# Patient Record
Sex: Male | Born: 1965 | Race: Black or African American | Hispanic: No | Marital: Single | Smoking: Current every day smoker
Health system: Southern US, Community
[De-identification: ages and names within clinical notes are randomized; demographics above are authoritative.]

## PROBLEM LIST (undated history)

## (undated) DIAGNOSIS — J45909 Unspecified asthma, uncomplicated: Secondary | ICD-10-CM

## (undated) DIAGNOSIS — J302 Other seasonal allergic rhinitis: Secondary | ICD-10-CM

## (undated) DIAGNOSIS — I10 Essential (primary) hypertension: Secondary | ICD-10-CM

## (undated) DIAGNOSIS — I509 Heart failure, unspecified: Secondary | ICD-10-CM

## (undated) HISTORY — DX: Heart failure, unspecified: I50.9

## (undated) HISTORY — DX: Essential (primary) hypertension: I10

## (undated) HISTORY — DX: Other seasonal allergic rhinitis: J30.2

## (undated) HISTORY — DX: Unspecified asthma, uncomplicated: J45.909

---

## 2001-11-16 ENCOUNTER — Emergency Department (HOSPITAL_COMMUNITY): Admission: EM | Admit: 2001-11-16 | Discharge: 2001-11-16 | Payer: Self-pay | Admitting: Emergency Medicine

## 2001-11-27 ENCOUNTER — Emergency Department (HOSPITAL_COMMUNITY): Admission: EM | Admit: 2001-11-27 | Discharge: 2001-11-27 | Payer: Self-pay | Admitting: Emergency Medicine

## 2006-12-27 ENCOUNTER — Emergency Department (HOSPITAL_COMMUNITY): Admission: EM | Admit: 2006-12-27 | Discharge: 2006-12-27 | Payer: Self-pay | Admitting: Emergency Medicine

## 2007-01-07 ENCOUNTER — Emergency Department (HOSPITAL_COMMUNITY): Admission: EM | Admit: 2007-01-07 | Discharge: 2007-01-07 | Payer: Self-pay | Admitting: Emergency Medicine

## 2012-05-29 ENCOUNTER — Emergency Department (HOSPITAL_COMMUNITY)
Admission: EM | Admit: 2012-05-29 | Discharge: 2012-05-30 | Disposition: A | Discharge status: Home / Self Care | Payer: BC Managed Care – PPO | Attending: Emergency Medicine | Admitting: Emergency Medicine

## 2012-05-29 ENCOUNTER — Ambulatory Visit: Payer: BC Managed Care – PPO

## 2012-05-29 ENCOUNTER — Ambulatory Visit (INDEPENDENT_AMBULATORY_CARE_PROVIDER_SITE_OTHER): Payer: BC Managed Care – PPO | Admitting: Family Medicine

## 2012-05-29 ENCOUNTER — Encounter (HOSPITAL_COMMUNITY): Payer: Self-pay | Admitting: Emergency Medicine

## 2012-05-29 VITALS — BP 196/140 | HR 125 | Temp 98.4°F | Resp 18 | Ht 68.5 in | Wt 190.0 lb

## 2012-05-29 DIAGNOSIS — I10 Essential (primary) hypertension: Secondary | ICD-10-CM

## 2012-05-29 DIAGNOSIS — F17200 Nicotine dependence, unspecified, uncomplicated: Secondary | ICD-10-CM | POA: Insufficient documentation

## 2012-05-29 DIAGNOSIS — R0601 Orthopnea: Secondary | ICD-10-CM

## 2012-05-29 DIAGNOSIS — R0609 Other forms of dyspnea: Secondary | ICD-10-CM | POA: Insufficient documentation

## 2012-05-29 DIAGNOSIS — I160 Hypertensive urgency: Secondary | ICD-10-CM

## 2012-05-29 DIAGNOSIS — R0989 Other specified symptoms and signs involving the circulatory and respiratory systems: Secondary | ICD-10-CM | POA: Insufficient documentation

## 2012-05-29 DIAGNOSIS — Z8709 Personal history of other diseases of the respiratory system: Secondary | ICD-10-CM | POA: Insufficient documentation

## 2012-05-29 DIAGNOSIS — R0602 Shortness of breath: Secondary | ICD-10-CM

## 2012-05-29 DIAGNOSIS — J45909 Unspecified asthma, uncomplicated: Secondary | ICD-10-CM | POA: Insufficient documentation

## 2012-05-29 LAB — POCT UA - MICROSCOPIC ONLY
Casts, Ur, LPF, POC: NEGATIVE
Yeast, UA: NEGATIVE

## 2012-05-29 LAB — POCT URINALYSIS DIPSTICK
Glucose, UA: NEGATIVE
Leukocytes, UA: NEGATIVE
Nitrite, UA: NEGATIVE
Spec Grav, UA: >=1.03
pH, UA: 5.5

## 2012-05-29 LAB — POCT CBC
HCT, POC: 44.9 % (ref 43.5–53.7)
MCH, POC: 24.2 pg — AB (ref 27–31.2)
MCV: 79.8 fL — AB (ref 80–97)
MPV: 10.2 fL (ref 0–99.8)
Platelet Count, POC: 485 10*3/uL — AB (ref 142–424)
RBC: 5.63 M/uL (ref 4.69–6.13)
WBC: 10.2 10*3/uL (ref 4.6–10.2)

## 2012-05-29 LAB — POCT I-STAT, CHEM 8
BUN: 19 mg/dL (ref 6–23)
HCT: 49 % (ref 39.0–52.0)
Hemoglobin: 16.7 g/dL (ref 13.0–17.0)
TCO2: 24 mmol/L (ref 0–100)

## 2012-05-29 LAB — GLUCOSE, POCT (MANUAL RESULT ENTRY): POC Glucose: 125 mg/dl — AB (ref 70–99)

## 2012-05-29 MED ORDER — CLONIDINE HCL 0.1 MG PO TABS
0.1000 mg | ORAL_TABLET | Freq: Once | ORAL | Status: AC
  Administered 2012-05-29: 0.1 mg via ORAL

## 2012-05-29 NOTE — ED Provider Notes (Signed)
History     CSN: [id number]  Arrival date & time 05/29/12  2036   First MD Initiated Contact with Patient 05/29/12 2329      Chief Complaint  Patient presents with  . Hypertension    (Consider location/radiation/quality/duration/timing/severity/associated sxs/prior treatment) HPI This is a 47 year old male with a history of untreated hypertension. He has been having nocturnal dyspnea for about the past 2 weeks. Specifically he states he is awakened in the middle of the night feeling short of breath. These episodes resolve after several minutes. They usually only occur once a night. There is no associated chest pain, headache, swelling, numbness or weakness. He was seen at [facility] urgent care this evening about 6:30. He was diagnosed with malignant hypertension with blood pressure noted to be as high as 196/151. It was also noted to be hypertensive. He was given clonidine 0.1 mg and sent to the ED for further evaluation; his latest blood pressure is 169/131 following clonidine administration. He denies shortness of breath or chest pain at the present time.  Past Medical History  Diagnosis Date  . Seasonal allergies   . Asthma   . Hypertension     History reviewed. No pertinent past surgical history.  Family History  Problem Relation Age of Onset  . Cancer Mother     Breast cancer    History  Substance Use Topics  . Smoking status: Current Every Day Smoker    Types: Cigarettes  . Smokeless tobacco: Not on file  . Alcohol Use: No      Review of Systems  All other systems reviewed and are negative.    Allergies  Penicillins  Home Medications   Current Outpatient Rx  Name  Route  Sig  Dispense  Refill  . cetirizine (ZYRTEC) 10 MG tablet   Oral   Take 10 mg by mouth daily as needed for allergies.         Marland Kitchen loratadine-pseudoephedrine (CLARITIN-D 12-HOUR) 5-120 MG per tablet   Oral   Take 1 tablet by mouth daily as needed for allergies.         .  Phenyleph-Doxylamine-DM-APAP (ALKA-SELTZER PLS ALLERGY & [facility] PO)   Oral   Take 1 tablet by mouth every 4 (four) hours as needed (for congestion).           BP 177/125  Pulse 117  Temp(Src) 97.6 F (36.4 C) (Oral)  Resp 16  SpO2 96%  Physical Exam General: Well-developed, well-nourished male in no acute distress; appearance consistent with age of record HENT: normocephalic, atraumatic Eyes: pupils equal round and reactive to light; extraocular muscles intact Neck: supple Heart: regular rate and rhythm; no murmurs, rubs or gallops Lungs: clear to auscultation bilaterally Abdomen: soft; nondistended; nontender; bowel sounds present Extremities: No deformity; full range of motion; pulses normal; trace edema of ankles Neurologic: Awake, alert and oriented; motor function intact in all extremities and symmetric; no facial droop; normal coordination and speech Skin: Warm and dry Psychiatric: Normal mood and affect    ED Course  Procedures (including critical care time)     MDM   Nursing notes and vitals signs, including pulse oximetry, reviewed.  Summary of this visit's results, reviewed by myself:  Labs:  Results for orders placed during the hospital encounter of 05/29/12 (from the past 24 hour(s))  PRO B NATRIURETIC PEPTIDE     Status: Abnormal   Collection Time    05/29/12 11:36 PM      Result Value Range   Pro  B Natriuretic peptide (BNP) 5344.0 (*) 0 - 125 pg/mL  POCT I-STAT, CHEM 8     Status: Abnormal   Collection Time    05/29/12 11:55 PM      Result Value Range   Sodium 142  135 - 145 mEq/L   Potassium 4.5  3.5 - 5.1 mEq/L   Chloride 106  96 - 112 mEq/L   BUN 19  6 - 23 mg/dL   Creatinine, Ser 5.78 (*) 0.50 - 1.35 mg/dL   Glucose, Bld 95  70 - 99 mg/dL   Calcium, Ion 4.69  1.12 - 1.23 mmol/L   TCO2 24  0 - 100 mmol/L   Hemoglobin 16.7  13.0 - 17.0 g/dL   HCT 62.9  52.8 - 41.3 %    Imaging Studies: Dg Chest 2 View  05/29/2012  *RADIOLOGY REPORT*   Clinical Data: Shortness of breath  CHEST - 2 VIEW  Comparison: None.  Findings: Mild patchy opacity at the lateral right lung base, possibly atelectasis.  No frank interstitial edema.  No pleural effusion or pneumothorax.  Cardiomegaly.  Visualized osseous structures are within normal limits.  IMPRESSION: Mild patchy opacity at the lateral right lung base, possibly atelectasis.  Cardiomegaly.  No frank interstitial edema.   Original Report Authenticated By: Charline Bills, M.D.    EKG Interpretation:  Date & Time: 05/29/2012 7:52 PM  Rate: 117  Rhythm: sinus tachycardia  QRS Axis: normal  Intervals: normal  ST/T Wave abnormalities: nonspecific T wave changes  Conduction Disutrbances:none  Narrative Interpretation: LAH, LVH  Old EKG Reviewed: none available  1:04 AM Patient's blood pressure is currently 167/137. We will start him on antihypertensives and he will follow up with Dr. Katrinka Blazing at [facility] urgent care. He was advised of his cardiomegaly and evidence of early congestive heart failure.          Hanley Seamen, MD 05/30/12 [phone]

## 2012-05-29 NOTE — ED Notes (Signed)
PT. REPORTS DIAGNOSED WITH HYPERTENSION AT A LOCAL URGENT CARE CLINIC TODAY RECEIVED CLONIDINE 0.1 MG PO AT CLINIC ADVISED TO GO TO ER FOR FURTHER EVALUATION . SLIGHT SOB .

## 2012-05-29 NOTE — Progress Notes (Signed)
[address]   [place], [place]  [address]   [phone]  Subjective:    Patient ID: Norman Jones, male    DOB: 06/26/1965, 47 y.o.   MRN: [id number]  HPI This 47 y.o. male presents for evaluation of SOB.  Onset two weeks ago; started during the day but did not notice until nighttime; will wake up from dead sleep with acute SOB.  No coughing.  No leg swelling.  No chest pain.  Ears feel like has water in them.  No diaphoresis.  Decreased taste sensation.  No headache.  No sore throat; no ear pain.  No rhinorrhea; no nasal congestion.  Able to breath through nose but feels like not enough air going in lungs. +snores.  Energy level is down.  DOE today at store.  No similar symptoms in past.  Took Claritin but made feel work; not sneezing.  Thought might have allergies.  No sneezing, itchy eyes or nose.  Has taken Xanax which made him feel worse. Took Alkeseltzer Plus to help with sleep and to clear up breathing.  No similar symptoms.  No new medications; no cessation of medications.  Takes Aleve PRN but sparingly.  History of asthma as child; resolved in 9th grade.  History of allergic rhinitis as child but resolved.  Previously prescribed medication for HTN; took one month worth.  No wheezing.     Review of Systems  Constitutional: Positive for fatigue. Negative for chills and diaphoresis.  HENT: Negative for ear pain, congestion, sore throat, rhinorrhea, sneezing, trouble swallowing, neck pain, neck stiffness, voice change, postnasal drip and sinus pressure.   Eyes: Negative for photophobia, pain and visual disturbance.  Respiratory: Positive for shortness of breath. Negative for cough, choking, chest tightness, wheezing and stridor.   Cardiovascular: Negative for chest pain, palpitations and leg swelling.  Gastrointestinal: Negative for nausea, vomiting and abdominal pain.  Endocrine: Negative for cold intolerance, heat intolerance, polydipsia, polyphagia and polyuria.  Neurological:  Negative for tremors, seizures, syncope, facial asymmetry, speech difficulty, weakness, light-headedness, numbness and headaches.        Past Medical History  Diagnosis Date  . Seasonal allergies   . Asthma   . Hypertension     History reviewed. No pertinent past surgical history.  Prior to Admission medications   Not on File    Allergies  Allergen Reactions  . Penicillins     History   Social History  . Marital Status: Single    Spouse Name: N/A    Number of Children: N/A  . Years of Education: N/A   Occupational History  . Not on file.   Social History Main Topics  . Smoking status: Current Every Day Smoker    Types: Cigarettes  . Smokeless tobacco: Not on file  . Alcohol Use: Not on file  . Drug Use: Not on file  . Sexually Active: Not on file   Other Topics Concern  . Not on file   Social History Narrative   Marital status: divorced      Children: none      Employment: Scientist, product/process development; works from home      Tobacco: 1 ppd      Alcohol:  3 ounces on weekends      Drugs:  None      Exercise: walking every night 2 miles.    Family History  Problem Relation Age of Onset  . Cancer Mother     Breast cancer    Objective:  Physical Exam  Nursing note and vitals reviewed. Constitutional: He is oriented to person, place, and time. He appears well-developed and well-nourished. No distress.  HENT:  Head: Normocephalic and atraumatic.  Right Ear: External ear normal.  Left Ear: External ear normal.  Nose: Nose normal.  Mouth/Throat: Oropharynx is clear and moist.  Eyes: Conjunctivae and EOM are normal. Pupils are equal, round, and reactive to light.  Neck: Normal range of motion. Neck supple. No JVD present. No thyromegaly present.  Cardiovascular: Regular rhythm, normal heart sounds and intact distal pulses.  Tachycardia present.  Exam reveals no gallop and no friction rub.   No murmur heard. Pulmonary/Chest: Effort normal. He has no wheezes. He has  no rhonchi. He has no rales.  Tachypnea with laying supine only.  Speaking in short sentences while supine.  Abdominal: Soft. Bowel sounds are normal. He exhibits no distension. There is no tenderness. There is no rebound.  Lymphadenopathy:    He has no cervical adenopathy.  Neurological: He is alert and oriented to person, place, and time. No cranial nerve deficit. He exhibits normal muscle tone. Coordination normal.  Skin: No rash noted. He is not diaphoretic.  Psychiatric: He has a normal mood and affect. His behavior is normal. Judgment and thought content normal.   Results for orders placed in visit on 05/29/12  POCT URINALYSIS DIPSTICK      Result Value Range   Color, UA amber     Clarity, UA clear     Glucose, UA neg     Bilirubin, UA small     Ketones, UA trace     Spec Grav, UA >=1.030     Blood, UA moderate     pH, UA 5.5     Protein, UA 100     Urobilinogen, UA 2.0     Nitrite, UA neg     Leukocytes, UA Negative    POCT UA - MICROSCOPIC ONLY      Result Value Range   WBC, Ur, HPF, POC 0-2     RBC, urine, microscopic 8-12     Bacteria, U Microscopic trace     Mucus, UA neg     Epithelial cells, urine per micros 1-2     Crystals, Ur, HPF, POC neg     Casts, Ur, LPF, POC neg     Yeast, UA neg    GLUCOSE, POCT (MANUAL RESULT ENTRY)      Result Value Range   POC Glucose 125 (*) 70 - 99 mg/dl  POCT CBC      Result Value Range   WBC 10.2  4.6 - 10.2 K/uL   Lymph, poc 2.5  0.6 - 3.4   POC LYMPH PERCENT 24.4  10 - 50 %L   MID (cbc) 0.6  0 - 0.9   POC MID % 6.2  0 - 12 %M   POC Granulocyte 7.1 (*) 2 - 6.9   Granulocyte percent 69.4  37 - 80 %G   RBC 5.63  4.69 - 6.13 M/uL   Hemoglobin 13.6 (*) 14.1 - 18.1 g/dL   HCT, POC 69.6  29.5 - 53.7 %   MCV 79.8 (*) 80 - 97 fL   MCH, POC 24.2 (*) 27 - 31.2 pg   MCHC 30.3 (*) 31.8 - 35.4 g/dL   RDW, POC 28.4     Platelet Count, POC 485 (*) 142 - 424 K/uL   MPV 10.2  0 - 99.8 fL   EKG:  NSR; diffuse ST  changes;  LVH.  [facility] reading (PRIMARY) by  Dr. Katrinka Blazing.  CXR:  No pulmonary edema; cardiomegaly.  CLONIDINE 0.1MG  PO X 1 IN OFFICE.    Assessment & Plan:  Shortness of breath - Plan: POCT urinalysis dipstick, POCT UA - Microscopic Only, POCT glucose (manual entry), POCT CBC, DG Chest 2 View, EKG 12-Lead  Malignant hypertension  Orthopnea  Unspecified essential hypertension - Plan: cloNIDine (CATAPRES) tablet 0.1 mg   1.  Malignant HTN:  New.  S/p  Clonidine 0.1mg  po in office. To ED for admission and further cardiac evaluation.  Pt agreeable.  2.  Orthopnea with DOE:  New.  Likely secondary to malignant HTN.  To ED for evaluation and treatment of hypertension.  Meds ordered this encounter  Medications  . cloNIDine (CATAPRES) tablet 0.1 mg    Sig:

## 2012-05-29 NOTE — Addendum Note (Signed)
Addended by: Bronson Curb on: 05/29/2012 08:18 PM   Modules accepted: Orders

## 2012-05-30 LAB — COMPREHENSIVE METABOLIC PANEL
ALT: 28 U/L (ref 0–53)
AST: 26 U/L (ref 0–37)
Albumin: 4 g/dL (ref 3.5–5.2)
BUN: 20 mg/dL (ref 6–23)
CO2: 23 mEq/L (ref 19–32)
Calcium: 9.9 mg/dL (ref 8.4–10.5)
Chloride: 104 mEq/L (ref 96–112)

## 2012-05-30 LAB — TSH: TSH: 2.838 u[IU]/mL (ref 0.350–4.500)

## 2012-05-30 MED ORDER — METOPROLOL SUCCINATE ER 25 MG PO TB24: 50.0000 mg | ORAL_TABLET | Freq: Every day | ORAL | Status: DC

## 2012-05-30 MED ORDER — HYDROCHLOROTHIAZIDE 25 MG PO TABS: 25.0000 mg | ORAL_TABLET | Freq: Every day | ORAL | Status: DC

## 2012-05-30 MED ORDER — METOPROLOL SUCCINATE ER 50 MG PO TB24
50.0000 mg | ORAL_TABLET | ORAL | Status: AC
  Administered 2012-05-30: 50 mg via ORAL
  Filled 2012-05-30: qty 1

## 2012-05-30 MED ORDER — HYDROCHLOROTHIAZIDE 25 MG PO TABS
25.0000 mg | ORAL_TABLET | Freq: Every day | ORAL | Status: DC
  Administered 2012-05-30: 25 mg via ORAL
  Filled 2012-05-30: qty 1

## 2012-05-30 NOTE — ED Notes (Signed)
Pt dc to home.   Pt states understanding to dc instructions.  Pt ambulatory to exit without difficulty.  Pt denies need for w/c. 

## 2012-08-05 ENCOUNTER — Encounter (HOSPITAL_COMMUNITY): Payer: Self-pay | Admitting: Emergency Medicine

## 2012-08-05 ENCOUNTER — Other Ambulatory Visit: Payer: Self-pay

## 2012-08-05 DIAGNOSIS — R609 Edema, unspecified: Secondary | ICD-10-CM | POA: Insufficient documentation

## 2012-08-05 DIAGNOSIS — I509 Heart failure, unspecified: Secondary | ICD-10-CM | POA: Insufficient documentation

## 2012-08-05 DIAGNOSIS — R0601 Orthopnea: Secondary | ICD-10-CM | POA: Insufficient documentation

## 2012-08-05 DIAGNOSIS — I110 Hypertensive heart disease with heart failure: Secondary | ICD-10-CM | POA: Insufficient documentation

## 2012-08-05 DIAGNOSIS — F17200 Nicotine dependence, unspecified, uncomplicated: Secondary | ICD-10-CM | POA: Insufficient documentation

## 2012-08-05 DIAGNOSIS — Z880 Allergy status to penicillin: Secondary | ICD-10-CM | POA: Insufficient documentation

## 2012-08-05 DIAGNOSIS — J45909 Unspecified asthma, uncomplicated: Secondary | ICD-10-CM | POA: Insufficient documentation

## 2012-08-05 NOTE — ED Notes (Signed)
PT. REPORTS ABDOMINAL , LEGS  AND FEET SWELLING FOR SEVERAL WEEKS WITH SLIGHT SOB AND OCCASIONAL DRY COUGH .

## 2012-08-06 ENCOUNTER — Emergency Department (HOSPITAL_COMMUNITY)
Admission: EM | Admit: 2012-08-06 | Discharge: 2012-08-06 | Disposition: A | Discharge status: Home / Self Care | Payer: Self-pay | Attending: Emergency Medicine | Admitting: Emergency Medicine

## 2012-08-06 ENCOUNTER — Emergency Department (HOSPITAL_COMMUNITY)
Admit: 2012-08-06 | Discharge: 2012-08-06 | Disposition: A | Discharge status: Home / Self Care | Payer: Self-pay | Attending: Emergency Medicine | Admitting: Emergency Medicine

## 2012-08-06 DIAGNOSIS — I110 Hypertensive heart disease with heart failure: Secondary | ICD-10-CM

## 2012-08-06 DIAGNOSIS — I10 Essential (primary) hypertension: Secondary | ICD-10-CM

## 2012-08-06 LAB — COMPREHENSIVE METABOLIC PANEL
BUN: 22 mg/dL (ref 6–23)
Chloride: 107 mEq/L (ref 96–112)
Creatinine, Ser: 1.58 mg/dL — ABNORMAL HIGH (ref 0.50–1.35)
Potassium: 4.3 mEq/L (ref 3.5–5.1)
Sodium: 139 mEq/L (ref 135–145)

## 2012-08-06 LAB — CBC WITH DIFFERENTIAL/PLATELET
Basophils Relative: 0 % (ref 0–1)
Eosinophils Relative: 0 % (ref 0–5)
HCT: 44 % (ref 39.0–52.0)
Hemoglobin: 14.2 g/dL (ref 13.0–17.0)
MCV: 74.7 fL — ABNORMAL LOW (ref 78.0–100.0)
Platelets: 382 10*3/uL (ref 150–400)
RBC: 5.89 MIL/uL — ABNORMAL HIGH (ref 4.22–5.81)
RDW: 17.8 % — ABNORMAL HIGH (ref 11.5–15.5)
WBC: 11.9 10*3/uL — ABNORMAL HIGH (ref 4.0–10.5)

## 2012-08-06 MED ORDER — FUROSEMIDE 40 MG PO TABS: 40.0000 mg | ORAL_TABLET | Freq: Every day | ORAL | Status: DC

## 2012-08-06 MED ORDER — FUROSEMIDE 10 MG/ML IJ SOLN
40.0000 mg | Freq: Once | INTRAMUSCULAR | Status: AC
  Administered 2012-08-06: 40 mg via INTRAVENOUS
  Filled 2012-08-06: qty 4

## 2012-08-06 MED ORDER — METOPROLOL SUCCINATE ER 25 MG PO TB24: 25.0000 mg | ORAL_TABLET | Freq: Once | ORAL | Status: DC

## 2012-08-06 MED ORDER — METOPROLOL SUCCINATE ER 25 MG PO TB24
25.0000 mg | ORAL_TABLET | Freq: Once | ORAL | Status: AC
  Administered 2012-08-06: 25 mg via ORAL
  Filled 2012-08-06: qty 1

## 2012-08-06 NOTE — ED Notes (Signed)
Received report no new changes in assessment

## 2012-08-06 NOTE — ED Notes (Signed)
Pt states that it is hard to catch breath at night when sleeping; has to sleep on side or on more than one pillow under head since April

## 2012-08-06 NOTE — ED Provider Notes (Signed)
History     CSN: [id number]  Arrival date & time 08/05/12  2336   First MD Initiated Contact with Patient 08/06/12 0133      Chief Complaint  Patient presents with  . Leg Swelling    (Consider location/radiation/quality/duration/timing/severity/associated sxs/prior treatment) HPI 47 year old male presents to emergency room with complaint of swelling from his feet up to his abdomen.  This is been ongoing for several weeks.  He reports since Friday, however, he has had swelling of his genitals.  Patient has history of hypertension, and possible congestive heart failure.  He has not been on his medications the last several months.  Patient reports he was seen for high blood pressure, and some lower extremity edema in April.  He was given prescriptions at that time, but soon after, lost his job and his insurance.  He recently got a job again and his insurance has been reinstated.  He plans to followup at [facility] urgent care.  He denies any chest pain.  He has orthopnea.  No dyspnea on exertion.  He currently sleeps on one and a half pillows, which is his baseline.  Past Medical History  Diagnosis Date  . Seasonal allergies   . Asthma   . Hypertension     History reviewed. No pertinent past surgical history.  Family History  Problem Relation Age of Onset  . Cancer Mother     Breast cancer    History  Substance Use Topics  . Smoking status: Current Every Day Smoker    Types: Cigarettes  . Smokeless tobacco: Not on file  . Alcohol Use: No      Review of Systems  See History of Present Illness; otherwise all other systems are reviewed and negative  Allergies  Penicillins  Home Medications   Current Outpatient Rx  Name  Route  Sig  Dispense  Refill  . furosemide (LASIX) 40 MG tablet   Oral   Take 1 tablet (40 mg total) by mouth daily.   30 tablet   0   . metoprolol succinate (TOPROL-XL) 25 MG 24 hr tablet   Oral   Take 1 tablet (25 mg total) by mouth once.   30  tablet   0     BP 120/85  Pulse 104  Temp(Src) 98.2 F (36.8 C) (Oral)  Resp 26  SpO2 100%  Physical Exam  Constitutional: He is oriented to person, place, and time. He appears well-developed and well-nourished.  HENT:  Head: Normocephalic and atraumatic.  Nose: Nose normal.  Mouth/Throat: Oropharynx is clear and moist.  Eyes: Conjunctivae and EOM are normal. Pupils are equal, round, and reactive to light.  Neck: Normal range of motion. Neck supple. JVD (mild) present. No tracheal deviation present. No thyromegaly present.  Cardiovascular: Normal rate, regular rhythm, normal heart sounds and intact distal pulses.  Exam reveals no gallop and no friction rub.   No murmur heard. Pulmonary/Chest: Effort normal. No stridor. No respiratory distress. He has no wheezes. He has rales (in bases bilaterally). He exhibits no tenderness.  Abdominal: Soft. Bowel sounds are normal. He exhibits no distension and no mass. There is no tenderness. There is no rebound and no guarding.  Musculoskeletal: Normal range of motion. He exhibits edema (patient with 2+ pitting edema to knees.  He is noted to have edema to his scrotum and penis.  He has nonpitting edema to his abdomen.). He exhibits no tenderness.  Lymphadenopathy:    He has no cervical adenopathy.  Neurological: He is  alert and oriented to person, place, and time. He has normal reflexes. He exhibits normal muscle tone. Coordination normal.  Skin: Skin is warm and dry. No rash noted. No erythema. No pallor.  Psychiatric: He has a normal mood and affect. His behavior is normal. Judgment and thought content normal.    ED Course  Procedures (including critical care time)  Labs Reviewed  CBC WITH DIFFERENTIAL - Abnormal; Notable for the following:    WBC 11.9 (*)    RBC 5.89 (*)    MCV 74.7 (*)    MCH 24.1 (*)    RDW 17.8 (*)    Neutro Abs 9.0 (*)    Monocytes Absolute 1.1 (*)    All other components within normal limits  COMPREHENSIVE  METABOLIC PANEL - Abnormal; Notable for the following:    Glucose, Bld 124 (*)    Creatinine, Ser 1.58 (*)    Albumin 3.2 (*)    Alkaline Phosphatase 191 (*)    Total Bilirubin 1.5 (*)    GFR calc non Af Amer 50 (*)    GFR calc Af Amer 58 (*)    All other components within normal limits  PRO B NATRIURETIC PEPTIDE - Abnormal; Notable for the following:    Pro B Natriuretic peptide (BNP) 6267.0 (*)    All other components within normal limits   Dg Chest 2 View  08/06/2012   *RADIOLOGY REPORT*  Clinical Data: Leg swelling  CHEST - 2 VIEW  Comparison: Chest radiograph 05/29/2012  Findings: Stable cardiomegaly.  Cephalization of the pulmonary vascularity.  Slight diffuse interstitial prominence.  Small focal opacity at the right lung base.  Probable tiny bilateral pleural effusions. No acute bony abnormality.  IMPRESSION: Suspect mild congestive heart failure.   Original Report Authenticated By: Britta Mccreedy, M.D.    Date: 08/06/2012  Rate: 121  Rhythm: sinus tachycardia  QRS Axis: normal  Intervals: normal  ST/T Wave abnormalities: normal  Conduction Disutrbances:none  Narrative Interpretation: LVH  Old EKG Reviewed: unchanged    1. Hypertension   2. Congestive heart failure due to high blood pressure       MDM  47 year old male with hypertension, peripheral edema.  Exam seems consistent with mild congestive heart failure.  Will restart patient on metoprolol.  Patient given IV Lasix here.  He does have followup planned.  Will also get cardiology followup for outpatient echo and further management and workup of his hypertension and early congestive heart failure.        Olivia Mackie, MD 08/06/12 [phone]

## 2012-09-29 ENCOUNTER — Other Ambulatory Visit: Payer: Self-pay | Admitting: *Deleted

## 2012-09-29 ENCOUNTER — Ambulatory Visit (INDEPENDENT_AMBULATORY_CARE_PROVIDER_SITE_OTHER): Payer: BC Managed Care – PPO | Admitting: Family Medicine

## 2012-09-29 VITALS — BP 164/102 | HR 133 | Temp 98.0°F | Resp 17 | Ht 68.5 in | Wt 185.0 lb

## 2012-09-29 DIAGNOSIS — I5022 Chronic systolic (congestive) heart failure: Secondary | ICD-10-CM

## 2012-09-29 DIAGNOSIS — I10 Essential (primary) hypertension: Secondary | ICD-10-CM

## 2012-09-29 DIAGNOSIS — I509 Heart failure, unspecified: Secondary | ICD-10-CM

## 2012-09-29 DIAGNOSIS — I5042 Chronic combined systolic (congestive) and diastolic (congestive) heart failure: Secondary | ICD-10-CM | POA: Insufficient documentation

## 2012-09-29 HISTORY — DX: Heart failure, unspecified: I50.9

## 2012-09-29 LAB — POCT CBC
Granulocyte percent: 74.5 %G (ref 37–80)
HCT, POC: 43.5 % (ref 43.5–53.7)
Hemoglobin: 13.4 g/dL — AB (ref 14.1–18.1)
Lymph, poc: 1.5 (ref 0.6–3.4)
MCH, POC: 24.1 pg — AB (ref 27–31.2)
MCHC: 30.8 g/dL — AB (ref 31.8–35.4)
MCV: 78.3 fL — AB (ref 80–97)
MID (cbc): 0.7 (ref 0–0.9)
MPV: 8.5 fL (ref 0–99.8)
POC Granulocyte: 6.5 (ref 2–6.9)
POC LYMPH PERCENT: 17.3 %L (ref 10–50)
POC MID %: 8.2 %M (ref 0–12)
Platelet Count, POC: 350 10*3/uL (ref 142–424)
RBC: 5.56 M/uL (ref 4.69–6.13)
RDW, POC: 20.9 %
WBC: 8.7 10*3/uL (ref 4.6–10.2)

## 2012-09-29 MED ORDER — METOPROLOL SUCCINATE ER 25 MG PO TB24: 25.0000 mg | ORAL_TABLET | Freq: Once | ORAL | Status: DC

## 2012-09-29 MED ORDER — FUROSEMIDE 40 MG PO TABS: 40.0000 mg | ORAL_TABLET | Freq: Every day | ORAL | Status: DC

## 2012-09-29 NOTE — Patient Instructions (Addendum)
Smoking Cessation Quitting smoking is important to your health and has many advantages. However, it is not always easy to quit since nicotine is a very addictive drug. Often times, people try 3 times or more before being able to quit. This document explains the best ways for you to prepare to quit smoking. Quitting takes hard work and a lot of effort, but you can do it. ADVANTAGES OF QUITTING SMOKING  You will live longer, feel better, and live better.  Your body will feel the impact of quitting smoking almost immediately.  Within 20 minutes, blood pressure decreases. Your pulse returns to its normal level.  After 8 hours, carbon monoxide levels in the blood return to normal. Your oxygen level increases.  After 24 hours, the chance of having a heart attack starts to decrease. Your breath, hair, and body stop smelling like smoke.  After 48 hours, damaged nerve endings begin to recover. Your sense of taste and smell improve.  After 72 hours, the body is virtually free of nicotine. Your bronchial tubes relax and breathing becomes easier.  After 2 to 12 weeks, lungs can hold more air. Exercise becomes easier and circulation improves.  The risk of having a heart attack, stroke, cancer, or lung disease is greatly reduced.  After 1 year, the risk of coronary heart disease is cut in half.  After 5 years, the risk of stroke falls to the same as a nonsmoker.  After 10 years, the risk of lung cancer is cut in half and the risk of other cancers decreases significantly.  After 15 years, the risk of coronary heart disease drops, usually to the level of a nonsmoker.  If you are pregnant, quitting smoking will improve your chances of having a healthy baby.  The people you live with, especially any children, will be healthier.  You will have extra money to spend on things other than cigarettes. QUESTIONS TO THINK ABOUT BEFORE ATTEMPTING TO QUIT You may want to talk about your answers with your  caregiver.  Why do you want to quit?  If you tried to quit in the past, what helped and what did not?  What will be the most difficult situations for you after you quit? How will you plan to handle them?  Who can help you through the tough times? Your family? Friends? A caregiver?  What pleasures do you get from smoking? What ways can you still get pleasure if you quit? Here are some questions to ask your caregiver:  How can you help me to be successful at quitting?  What medicine do you think would be best for me and how should I take it?  What should I do if I need more help?  What is smoking withdrawal like? How can I get information on withdrawal? GET READY  Set a quit date.  Change your environment by getting rid of all cigarettes, ashtrays, matches, and lighters in your home, car, or work. Do not let people smoke in your home.  Review your past attempts to quit. Think about what worked and what did not. GET SUPPORT AND ENCOURAGEMENT You have a better chance of being successful if you have help. You can get support in many ways.  Tell your family, friends, and co-workers that you are going to quit and need their support. Ask them not to smoke around you.  Get individual, group, or telephone counseling and support. Programs are available at local hospitals and health centers. Call your local health department for   information about programs in your area.  Spiritual beliefs and practices may help some smokers quit.  Download a "quit meter" on your computer to keep track of quit statistics, such as how long you have gone without smoking, cigarettes not smoked, and money saved.  Get a self-help book about quitting smoking and staying off of tobacco. LEARN NEW SKILLS AND BEHAVIORS  Distract yourself from urges to smoke. Talk to someone, go for a walk, or occupy your time with a task.  Change your normal routine. Take a different route to work. Drink tea instead of coffee.  Eat breakfast in a different place.  Reduce your stress. Take a hot bath, exercise, or read a book.  Plan something enjoyable to do every day. Reward yourself for not smoking.  Explore interactive web-based programs that specialize in helping you quit. GET MEDICINE AND USE IT CORRECTLY Medicines can help you stop smoking and decrease the urge to smoke. Combining medicine with the above behavioral methods and support can greatly increase your chances of successfully quitting smoking.  Nicotine replacement therapy helps deliver nicotine to your body without the negative effects and risks of smoking. Nicotine replacement therapy includes nicotine gum, lozenges, inhalers, nasal sprays, and skin patches. Some may be available over-the-counter and others require a prescription.  Antidepressant medicine helps people abstain from smoking, but how this works is unknown. This medicine is available by prescription.  Nicotinic receptor partial agonist medicine simulates the effect of nicotine in your brain. This medicine is available by prescription. Ask your caregiver for advice about which medicines to use and how to use them based on your health history. Your caregiver will tell you what side effects to look out for if you choose to be on a medicine or therapy. Carefully read the information on the package. Do not use any other product containing nicotine while using a nicotine replacement product.  RELAPSE OR DIFFICULT SITUATIONS Most relapses occur within the first 3 months after quitting. Do not be discouraged if you start smoking again. Remember, most people try several times before finally quitting. You may have symptoms of withdrawal because your body is used to nicotine. You may crave cigarettes, be irritable, feel very hungry, cough often, get headaches, or have difficulty concentrating. The withdrawal symptoms are only temporary. They are strongest when you first quit, but they will go away within  10 14 days. To reduce the chances of relapse, try to:  Avoid drinking alcohol. Drinking lowers your chances of successfully quitting.  Reduce the amount of caffeine you consume. Once you quit smoking, the amount of caffeine in your body increases and can give you symptoms, such as a rapid heartbeat, sweating, and anxiety.  Avoid smokers because they can make you want to smoke.  Do not let weight gain distract you. Many smokers will gain weight when they quit, usually less than 10 pounds. Eat a healthy diet and stay active. You can always lose the weight gained after you quit.  Find ways to improve your mood other than smoking. FOR MORE INFORMATION  www.smokefree.gov  Document Released: 01/30/2001 Document Revised: 08/07/2011 Document Reviewed: 05/17/2011 [employer] Patient Information 2014 [employer]. Hypertension As your heart beats, it forces blood through your arteries. This force is your blood pressure. If the pressure is too high, it is called hypertension (HTN) or high blood pressure. HTN is dangerous because you may have it and not know it. High blood pressure may mean that your heart has to work harder to pump   blood. Your arteries may be narrow or stiff. The extra work puts you at risk for heart disease, stroke, and other problems.  Blood pressure consists of two numbers, a higher number over a lower, 110/72, for example. It is stated as "110 over 72." The ideal is below 120 for the top number (systolic) and under 80 for the bottom (diastolic). Write down your blood pressure today. You should pay close attention to your blood pressure if you have certain conditions such as:  Heart failure.  Prior heart attack.  Diabetes  Chronic kidney disease.  Prior stroke.  Multiple risk factors for heart disease. To see if you have HTN, your blood pressure should be measured while you are seated with your arm held at the level of the heart. It should be measured at least twice. A  one-time elevated blood pressure reading (especially in the Emergency Department) does not mean that you need treatment. There may be conditions in which the blood pressure is different between your right and left arms. It is important to see your caregiver soon for a recheck. Most people have essential hypertension which means that there is not a specific cause. This type of high blood pressure may be lowered by changing lifestyle factors such as:  Stress.  Smoking.  Lack of exercise.  Excessive weight.  Drug/tobacco/alcohol use.  Eating less salt. Most people do not have symptoms from high blood pressure until it has caused damage to the body. Effective treatment can often prevent, delay or reduce that damage. TREATMENT  When a cause has been identified, treatment for high blood pressure is directed at the cause. There are a large number of medications to treat HTN. These fall into several categories, and your caregiver will help you select the medicines that are best for you. Medications may have side effects. You should review side effects with your caregiver. If your blood pressure stays high after you have made lifestyle changes or started on medicines,   Your medication(s) may need to be changed.  Other problems may need to be addressed.  Be certain you understand your prescriptions, and know how and when to take your medicine.  Be sure to follow up with your caregiver within the time frame advised (usually within two weeks) to have your blood pressure rechecked and to review your medications.  If you are taking more than one medicine to lower your blood pressure, make sure you know how and at what times they should be taken. Taking two medicines at the same time can result in blood pressure that is too low. SEEK IMMEDIATE MEDICAL CARE IF:  You develop a severe headache, blurred or changing vision, or confusion.  You have unusual weakness or numbness, or a faint feeling.  You  have severe chest or abdominal pain, vomiting, or breathing problems. MAKE SURE YOU:   Understand these instructions.  Will watch your condition.  Will get help right away if you are not doing well or get worse. Document Released: 02/05/2005 Document Revised: 04/30/2011 Document Reviewed: 09/26/2007 Exit[employer] Patient Information 2014 [employer].  

## 2012-09-29 NOTE — Progress Notes (Signed)
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  He lost his insurance and lost his medication. Smoker Family history of renal failure  Objective: NAD Heent:  Normal Neck: JVD is significant Heart:  Reg, S4 gallop, rate of 100 Chest: clear Ext: 1+ edema Abdomen:  Smooth liver edge, slightly protuberant, no masses  Assessment:  Hypertension with early CHF. I am very concerned about this man with his rapid pulse, S4, and recurrent edema. His family history suggested he may be in line for renal failure.  Plan: Check comprehensive metabolic profile, free T4, and CBC. Refer to radiology Recheck 3 months  Signed, Sheila Oats.D.

## 2012-09-30 LAB — LIPID PANEL
Cholesterol: 143 mg/dL (ref 0–200)
HDL: 32 mg/dL — ABNORMAL LOW (ref >39)
LDL Cholesterol: 92 mg/dL (ref 0–99)
Total CHOL/HDL Ratio: 4.5 Ratio
Triglycerides: 95 mg/dL (ref <150)
VLDL: 19 mg/dL (ref 0–40)

## 2012-09-30 LAB — COMPREHENSIVE METABOLIC PANEL
ALT: 12 U/L (ref 0–53)
AST: 18 U/L (ref 0–37)
Albumin: 4 g/dL (ref 3.5–5.2)
Alkaline Phosphatase: 180 U/L — ABNORMAL HIGH (ref 39–117)
BUN: 16 mg/dL (ref 6–23)
CO2: 24 mEq/L (ref 19–32)
Calcium: 9.7 mg/dL (ref 8.4–10.5)
Chloride: 102 mEq/L (ref 96–112)
Creat: 1.41 mg/dL — ABNORMAL HIGH (ref 0.50–1.35)
Glucose, Bld: 124 mg/dL — ABNORMAL HIGH (ref 70–99)
Potassium: 4.9 mEq/L (ref 3.5–5.3)
Sodium: 139 mEq/L (ref 135–145)
Total Bilirubin: 1.2 mg/dL (ref 0.3–1.2)
Total Protein: 6.7 g/dL (ref 6.0–8.3)

## 2012-09-30 LAB — T4, FREE: Free T4: 1.39 ng/dL (ref 0.80–1.80)

## 2013-10-07 ENCOUNTER — Other Ambulatory Visit: Payer: Self-pay | Admitting: Family Medicine

## 2013-11-22 ENCOUNTER — Other Ambulatory Visit: Payer: Self-pay | Admitting: Family Medicine

## 2016-08-20 ENCOUNTER — Emergency Department (HOSPITAL_COMMUNITY): Payer: BLUE CROSS/BLUE SHIELD

## 2016-08-20 ENCOUNTER — Emergency Department (HOSPITAL_COMMUNITY)
Admission: EM | Admit: 2016-08-20 | Discharge: 2016-08-20 | Disposition: A | Discharge status: Home / Self Care | Payer: BLUE CROSS/BLUE SHIELD | Attending: Emergency Medicine | Admitting: Emergency Medicine

## 2016-08-20 ENCOUNTER — Encounter (HOSPITAL_COMMUNITY): Payer: Self-pay

## 2016-08-20 DIAGNOSIS — F17210 Nicotine dependence, cigarettes, uncomplicated: Secondary | ICD-10-CM | POA: Diagnosis not present

## 2016-08-20 DIAGNOSIS — R0602 Shortness of breath: Secondary | ICD-10-CM | POA: Diagnosis present

## 2016-08-20 DIAGNOSIS — R0601 Orthopnea: Secondary | ICD-10-CM | POA: Insufficient documentation

## 2016-08-20 DIAGNOSIS — I110 Hypertensive heart disease with heart failure: Secondary | ICD-10-CM | POA: Diagnosis not present

## 2016-08-20 DIAGNOSIS — J45909 Unspecified asthma, uncomplicated: Secondary | ICD-10-CM | POA: Insufficient documentation

## 2016-08-20 DIAGNOSIS — I509 Heart failure, unspecified: Secondary | ICD-10-CM

## 2016-08-20 LAB — CBC
HEMATOCRIT: 42 % (ref 39.0–52.0)
Hemoglobin: 12.4 g/dL — ABNORMAL LOW (ref 13.0–17.0)
MCH: 21.9 pg — ABNORMAL LOW (ref 26.0–34.0)
MCHC: 29.5 g/dL — AB (ref 30.0–36.0)
MCV: 74.3 fL — AB (ref 78.0–100.0)
PLATELETS: 324 10*3/uL (ref 150–400)
RBC: 5.65 MIL/uL (ref 4.22–5.81)
RDW: 20.3 % — AB (ref 11.5–15.5)
WBC: 8.5 10*3/uL (ref 4.0–10.5)

## 2016-08-20 LAB — BASIC METABOLIC PANEL
Anion gap: 11 (ref 5–15)
BUN: 17 mg/dL (ref 6–20)
CHLORIDE: 107 mmol/L (ref 101–111)
CO2: 23 mmol/L (ref 22–32)
Calcium: 8.7 mg/dL — ABNORMAL LOW (ref 8.9–10.3)
Creatinine, Ser: 1.86 mg/dL — ABNORMAL HIGH (ref 0.61–1.24)
GFR calc Af Amer: 46 mL/min — ABNORMAL LOW (ref >60)
GFR calc non Af Amer: 40 mL/min — ABNORMAL LOW (ref >60)
GLUCOSE: 109 mg/dL — AB (ref 65–99)
POTASSIUM: 4.2 mmol/L (ref 3.5–5.1)
SODIUM: 141 mmol/L (ref 135–145)

## 2016-08-20 LAB — HEPATIC FUNCTION PANEL
ALK PHOS: 230 U/L — AB (ref 38–126)
ALT: 17 U/L (ref 17–63)
AST: 22 U/L (ref 15–41)
Albumin: 2.9 g/dL — ABNORMAL LOW (ref 3.5–5.0)
BILIRUBIN DIRECT: 0.3 mg/dL (ref 0.1–0.5)
BILIRUBIN INDIRECT: 0.5 mg/dL (ref 0.3–0.9)
BILIRUBIN TOTAL: 0.8 mg/dL (ref 0.3–1.2)
Total Protein: 5.4 g/dL — ABNORMAL LOW (ref 6.5–8.1)

## 2016-08-20 LAB — I-STAT TROPONIN, ED: Troponin i, poc: 0.06 ng/mL (ref 0.00–0.08)

## 2016-08-20 LAB — BRAIN NATRIURETIC PEPTIDE: B NATRIURETIC PEPTIDE 5: 3418.9 pg/mL — AB (ref 0.0–100.0)

## 2016-08-20 MED ORDER — FUROSEMIDE 40 MG PO TABS: 40.0000 mg | ORAL_TABLET | Freq: Every day | ORAL | 0 refills | Status: DC

## 2016-08-20 MED ORDER — FUROSEMIDE 10 MG/ML IJ SOLN
40.0000 mg | Freq: Once | INTRAMUSCULAR | Status: AC
  Administered 2016-08-20: 40 mg via INTRAVENOUS
  Filled 2016-08-20: qty 4

## 2016-08-20 NOTE — ED Provider Notes (Signed)
[facility]-EMERGENCY DEPT Provider Note   CSN: [id number] Arrival date & time: 08/20/16  1114     History   Chief Complaint Chief Complaint  Patient presents with  . Shortness of Breath  . Leg Swelling    HPI Norman Jones. is a 51 y.o. male.  The history is provided by the patient.  Shortness of Breath  This is a recurrent problem. The average episode lasts 3 months. The problem occurs intermittently.The current episode started more than 1 week ago. The problem has been gradually worsening. Associated symptoms include PND, orthopnea and leg swelling. Pertinent negatives include no fever, no rhinorrhea, no sore throat, no ear pain, no cough, no wheezing, no chest pain, no syncope, no vomiting, no abdominal pain and no rash. He has tried nothing for the symptoms. Associated medical issues do not include past MI.    Past Medical History:  Diagnosis Date  . Asthma   . Hypertension   . Seasonal allergies     Patient Active Problem List   Diagnosis Date Noted  . Hypertension 09/29/2012  . CHF (congestive heart failure) (HCC) 09/29/2012    History reviewed. No pertinent surgical history.     Home Medications    Prior to Admission medications   Medication Sig Start Date End Date Taking? Authorizing Provider  aspirin 325 MG tablet Take 325 mg by mouth daily.   Yes [provider]  fluticasone (FLONASE) 50 MCG/ACT nasal spray Place 1 spray into both nostrils daily as needed for allergies or rhinitis.   Yes [provider]  naproxen sodium (ANAPROX) 220 MG tablet Take 220 mg by mouth daily as needed (Edema).    Yes [provider]  furosemide (LASIX) 40 MG tablet Take 1 tablet (40 mg total) by mouth daily. NO MORE REFILLS WITHOUT OFFICE VISIT - 2ND NOTICE Patient not taking: Reported on 08/20/2016 11/23/13   Porfirio Oar, PA-C  furosemide (LASIX) 40 MG tablet Take 1 tablet (40 mg total) by mouth daily. 08/20/16   Orson Slick, MD  metoprolol succinate  (TOPROL-XL) 25 MG 24 hr tablet Take 1 tablet (25 mg total) by mouth daily. NO MORE REFILLS WITHOUT OFFICE VISIT - 2ND NOTICE Patient not taking: Reported on 08/20/2016 11/23/13   Porfirio Oar, PA-C    Family History Family History  Problem Relation Age of Onset  . Cancer Mother        Breast cancer    Social History Social History  Substance Use Topics  . Smoking status: Current Every Day Smoker    Packs/day: 0.50    Types: Cigarettes  . Smokeless tobacco: Never Used  . Alcohol use Yes     Comment: socially     Allergies   Penicillins   Review of Systems Review of Systems  Constitutional: Negative for chills and fever.  HENT: Negative for ear pain, rhinorrhea and sore throat.   Eyes: Negative for pain and visual disturbance.  Respiratory: Positive for shortness of breath. Negative for cough, wheezing and stridor.   Cardiovascular: Positive for orthopnea, leg swelling and PND. Negative for chest pain, palpitations and syncope.  Gastrointestinal: Positive for abdominal distention. Negative for abdominal pain, constipation, diarrhea, nausea and vomiting.  Endocrine: Negative for polyuria.  Genitourinary: Negative for dysuria and hematuria.  Musculoskeletal: Negative for arthralgias and back pain.  Skin: Negative for color change and rash.  Neurological: Negative for seizures and syncope.  Psychiatric/Behavioral: Negative for agitation.  All other systems reviewed and are negative.    Physical Exam  Updated Vital Signs BP (!) 158/111   Pulse (!) 109   Temp 98.4 F (36.9 C)   Resp (!) 30   Ht 5\' 10"  (1.778 m)   Wt 87.5 kg (193 lb)   SpO2 98%   BMI 27.69 kg/m   Physical Exam  Constitutional: He is oriented to person, place, and time. He appears well-developed and well-nourished.  HENT:  Head: Normocephalic and atraumatic.  Eyes: Conjunctivae and EOM are normal. Pupils are equal, round, and reactive to light.  Neck: Neck supple.  Cardiovascular: Normal rate and  intact distal pulses.   No murmur heard. Tachycardic   Pulmonary/Chest: Effort normal. No respiratory distress. He has rales (bilateral bases).  Abdominal: Soft. Bowel sounds are normal. He exhibits distension. There is no tenderness. There is no guarding.  Musculoskeletal: He exhibits no tenderness or deformity.  Very minimal edema bilateral lower extremities, symmetric.  Neurological: He is alert and oriented to person, place, and time. No cranial nerve deficit.  Skin: Skin is warm and dry.  Psychiatric: He has a normal mood and affect.  Nursing note and vitals reviewed.    ED Treatments / Results  Labs (all labs ordered are listed, but only abnormal results are displayed) Labs Reviewed  BASIC METABOLIC PANEL - Abnormal; Notable for the following:       Result Value   Glucose, Bld 109 (*)    Creatinine, Ser 1.86 (*)    Calcium 8.7 (*)    GFR calc non Af Amer 40 (*)    GFR calc Af Amer 46 (*)    All other components within normal limits  CBC - Abnormal; Notable for the following:    Hemoglobin 12.4 (*)    MCV 74.3 (*)    MCH 21.9 (*)    MCHC 29.5 (*)    RDW 20.3 (*)    All other components within normal limits  HEPATIC FUNCTION PANEL - Abnormal; Notable for the following:    Total Protein 5.4 (*)    Albumin 2.9 (*)    Alkaline Phosphatase 230 (*)    All other components within normal limits  BRAIN NATRIURETIC PEPTIDE - Abnormal; Notable for the following:    B Natriuretic Peptide 3,418.9 (*)    All other components within normal limits  I-STAT TROPOININ, ED    EKG  EKG Interpretation  Date/Time:  Monday August 20 2016 11:50:09 EDT Ventricular Rate:  115 PR Interval:  132 QRS Duration: 86 QT Interval:  328 QTC Calculation: 453 R Axis:   92 Text Interpretation:  Sinus tachycardia Rightward axis T wave abnormality, consider inferior ischemia Abnormal ECG No significant change was found Confirmed by Azalia Bilis ([id number]) on 08/20/2016 5:21:56 PM        Radiology Dg Chest 2 View  Result Date: 08/20/2016 CLINICAL DATA:  Shortness of breath EXAM: CHEST  2 VIEW COMPARISON:  08/06/2012 FINDINGS: Chronic cardiomegaly. Negative aortic and hilar contours. Subtle Kerley lines bilaterally. No pleural effusion or air bronchogram. IMPRESSION: Suspect mild CHF. Electronically Signed   By: Marnee Spring M.D.   On: 08/20/2016 12:23    Procedures Procedures (including critical care time)  Medications Ordered in ED Medications  furosemide (LASIX) injection 40 mg (40 mg Intravenous Given 08/20/16 1830)     Initial Impression / Assessment and Plan / ED Course  I have reviewed the triage vital signs and the nursing notes.  Pertinent labs & imaging results that were available during my care of the patient were reviewed by me  and considered in my medical decision making (see chart for details).     Norman Jones. is a 51 y.o.  male with history of high blood pressure coming in today with shortness of breath as well as leg and abdominal swelling. Patient states he was prescribed blood pressure medication and water pills a few years ago but stopped taking them a couple years ago due to having no insurance. Patient states over the last 3 months he has had worsening shortness of breath on exertion as well as paroxysmal nocturnal dyspnea. He is also had swelling in the lower extremities and abdomen. No fevers, nausea, vomiting, chest pain, abdominal pain.  On exam patient sitting up in bed in no apparent distress. Abdomen is distended but nontender and soft. Very mild crackles at lung bases and mildly tachycardic. Creatinine mildly elevated. No leukocytosis. Chest x-ray concerning for mild CHF with Kerley lines present bilaterally. BNP elevated at 3,418. This does appear to be less than previous readings. Patient given 40 mg Lasix IV, and had large amount of urine out the throughout ED stay. Voiced that his legs felt much better and he was able to ambulate  maintaining pulse ox above 95%. Patient denied shortness of breath during his ambulation. Do believe patient is safe for discharge and does not require inpatient management of his CHF exacerbation.   He will be discharged in stable condition and given prescription for 40 mg Lasix by mouth daily. Of note, patient mildly tachycardic at time of discharge however he states he is always noted to have tachycardia when visiting physicians and says his "heart usually runs high". Instructed to follow up with primary care in the next few days as well as cardiology in the next few days for further evaluation and management of his CHF. He voiced understanding and agreement and through shared decision making he was comfortable going home with outpatient follow-up.  Patient was seen with my attending, Dr. Patria Mane, who voiced agreement and oversaw the evaluation and treatment of this patient.   Dragon [employer] illustrator was used in the creation of this note. If there are any errors or inconsistencies needing clarification, please contact me directly.   Final Clinical Impressions(s) / ED Diagnoses   Final diagnoses:  Acute on chronic congestive heart failure, unspecified heart failure type (HCC)    New Prescriptions New Prescriptions   FUROSEMIDE (LASIX) 40 MG TABLET    Take 1 tablet (40 mg total) by mouth daily.     Orson Slick, MD 08/20/16 Joseph Pierini    Azalia Bilis, MD 08/21/16 [phone]

## 2016-08-20 NOTE — ED Triage Notes (Signed)
Pt noticed lower extremity swelling about 3 weeks ago that has now progressed to abdomen swelling and facial swelling. Pt reports he has also been waking up in the middle of the night SOB that is worse with laying flat

## 2016-08-20 NOTE — ED Notes (Addendum)
Pt ambulated with a pulse ox maintaining above 95%.

## 2016-08-20 NOTE — ED Notes (Signed)
Patient ambulatory to the room, no difficulty breathing or walking. Patient changing into gown independently at this time.

## 2016-08-20 NOTE — ED Notes (Addendum)
Pt up ambulating in room. Says legs feel better

## 2016-08-20 NOTE — ED Notes (Signed)
ED Provider at bedside. 

## 2016-08-20 NOTE — ED Notes (Signed)
ED resident at bedside.

## 2016-08-20 NOTE — ED Notes (Signed)
Update pt on plan of care.  He is very pleasant and verbalizes understanding.  Pt. Denies any pain at this time.  Pt. Has no s/s of sob

## 2016-08-22 NOTE — Progress Notes (Signed)
Cardiology Office Note   Date:  08/23/2016   ID:  Norman Dunk., DOB [birth year], MRN [id number]  PCP:  Julaine Fusi, NP  Cardiologist:   Rollene Rotunda, MD  Referring:  ED MD  No chief complaint on file.     History of Present Illness: Norman Timberman. is a 51 y.o. male who presents for evaluation of acute on chronic systolic HF.   The patient has a history of CHF.  I was able to review an echo from 2014 at which time his EF was 27%.  He had an exercise perfusion study with a dense inferior scar.  He was in the ED with increased dyspnea and edema.  I reviewed these records for this visit.    He had not been taking his meds.  He had mild CHF on CXR and BNP was elevated.  He was treated with IV diuresis and discharged on PO Lasix from the ED.     He reports starting in April he been having increasing dyspnea with mild exertion and having to sleep at times in the chair with clear PND and orthopnea. He said that after getting the Lasix in the emergency room the first good night's sleep he had in a while. He was able to go to work yesterday without significant shortness of breath. He still has leg edema. The patient denies any new symptoms such as chest discomfort, neck or arm discomfort. There have been no reported palpitations, presyncope or syncope.   Past Medical History:  Diagnosis Date  . Asthma   . CHF (congestive heart failure) (HCC) 09/29/2012  . Hypertension   . Seasonal allergies     No past surgical history on file.   Current Outpatient Prescriptions  Medication Sig Dispense Refill  . furosemide (LASIX) 40 MG tablet Take 1 tablet (40 mg total) by mouth daily. 14 tablet 0  . aspirin EC 81 MG tablet Take 1 tablet (81 mg total) by mouth daily. 90 tablet 3  . losartan (COZAAR) 25 MG tablet Take 1 tablet (25 mg total) by mouth 2 (two) times daily. 60 tablet 3   No current facility-administered medications for this visit.     Allergies:   Penicillins    Social  History:  The patient  reports that he has been smoking Cigarettes.  He has been smoking about 0.50 packs per day. He has never used smokeless tobacco. He reports that he drinks alcohol. He reports that he does not use drugs.   Family History:  The patient's family history includes Cancer in his mother.   He thinks that his dad might have had heart disease.    ROS:  Please see the history of present illness.   Otherwise, review of systems are positive for none.   All other systems are reviewed and negative.    PHYSICAL EXAM: VS:  BP (!) 138/98   Pulse (!) 108   Ht 5\' 10"  (1.778 m)   Wt 177 lb (80.3 kg)   BMI 25.40 kg/m  , BMI Body mass index is 25.4 kg/m. GENERAL:  Well appearing HEENT:  Pupils equal round and reactive, fundi not visualized, oral mucosa unremarkable NECK:  Positive jugular venous distention to jaw, waveform within normal limits, carotid upstroke brisk and symmetric, no bruits, no thyromegaly LYMPHATICS:  No cervical, inguinal adenopathy LUNGS:  Clear to auscultation bilaterally BACK:  No CVA tenderness CHEST:  Unremarkable HEART:  PMI not displaced or sustained,S1 and S2 within normal limits,  positive S3, no S4, no clicks, no rubs.  3/6 holosystolic apical and axillary murmur ABD:  Flat, positive bowel sounds normal in frequency in pitch, no bruits, no rebound, no guarding, no midline pulsatile mass, no hepatomegaly, no splenomegaly EXT:  2 plus pulses throughout, moderately severe edema to to the thights, no cyanosis no clubbing SKIN:  No rashes no nodules NEURO:  Cranial nerves II through XII grossly intact, motor grossly intact throughout PSYCH:  Cognitively intact, oriented to person place and time    EKG:  EKG is not ordered today. The ekg ordered 7/2 demonstrates Sinus tachycardia, rate 115, nonspecific lateral T-wave changes, rightward axis.   Recent Labs: 08/20/2016: ALT 17; B Natriuretic Peptide 3,418.9; BUN 17; Creatinine, Ser 1.86; Hemoglobin 12.4;  Platelets 324; Potassium 4.2; Sodium 141    Lipid Panel    Component Value Date/Time   CHOL 143 09/29/2012 1101   TRIG 95 09/29/2012 1101   HDL 32 (L) 09/29/2012 1101   CHOLHDL 4.5 09/29/2012 1101   VLDL 19 09/29/2012 1101   LDLCALC 92 09/29/2012 1101      Wt Readings from Last 3 Encounters:  08/23/16 177 lb (80.3 kg)  08/20/16 193 lb (87.5 kg)  09/29/12 185 lb (83.9 kg)      Other studies Reviewed: Additional studies/ records that were reviewed today include: ED records, previous echo. Review of the above records demonstrates:  Please see elsewhere in the note.     ASSESSMENT AND PLAN:  ACUTE ON CHRONIC SYSTOLIC HF:  I'm quite concerned that he now has advanced heart failure. I need to start with an echocardiogram to see what we are. I am going to start Cozaar realizing this will be somewhat difficult because of his renal insufficiency. We can titrate this and then beta blockers. Ultimately he will need right and left heart cath. A long discussion about salt restriction, fluid restriction, daily weights. He's given instructions on wearing knee-high compression stockings. He's given instructions on keeping his feet elevated. He was given information on heart failure. Thankfully he relates class II symptoms at this point.  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    MURMUR:  I suspect MR and TR.  Echo as above.    Current medicines are reviewed at length with the patient today.  The patient does not have concerns regarding medicines.  The following changes have been made:  As above  Labs/ tests ordered today include:   Orders Placed This Encounter  Procedures  . Basic metabolic panel  . Basic Metabolic Panel (BMET)  . ECHOCARDIOGRAM COMPLETE     Disposition:   FU with [facility] in one week.     Signed, Rollene Rotunda, MD  08/23/2016 9:01 AM    [facility]

## 2016-08-23 ENCOUNTER — Ambulatory Visit (INDEPENDENT_AMBULATORY_CARE_PROVIDER_SITE_OTHER): Payer: BLUE CROSS/BLUE SHIELD | Admitting: Adult Health

## 2016-08-23 ENCOUNTER — Ambulatory Visit (INDEPENDENT_AMBULATORY_CARE_PROVIDER_SITE_OTHER): Payer: BLUE CROSS/BLUE SHIELD | Admitting: Cardiology

## 2016-08-23 ENCOUNTER — Encounter: Payer: Self-pay | Admitting: Cardiology

## 2016-08-23 ENCOUNTER — Other Ambulatory Visit: Payer: Self-pay | Admitting: *Deleted

## 2016-08-23 ENCOUNTER — Encounter: Payer: Self-pay | Admitting: Adult Health

## 2016-08-23 VITALS — BP 138/98 | HR 108 | Ht 70.0 in | Wt 177.0 lb

## 2016-08-23 VITALS — BP 133/97 | HR 112 | Ht 68.0 in | Wt 176.6 lb

## 2016-08-23 DIAGNOSIS — I5021 Acute systolic (congestive) heart failure: Secondary | ICD-10-CM

## 2016-08-23 DIAGNOSIS — I10 Essential (primary) hypertension: Secondary | ICD-10-CM | POA: Diagnosis not present

## 2016-08-23 DIAGNOSIS — Z0000 Encounter for general adult medical examination without abnormal findings: Secondary | ICD-10-CM

## 2016-08-23 DIAGNOSIS — L853 Xerosis cutis: Secondary | ICD-10-CM

## 2016-08-23 DIAGNOSIS — N182 Chronic kidney disease, stage 2 (mild): Secondary | ICD-10-CM

## 2016-08-23 DIAGNOSIS — Z833 Family history of diabetes mellitus: Secondary | ICD-10-CM | POA: Diagnosis not present

## 2016-08-23 DIAGNOSIS — Z79899 Other long term (current) drug therapy: Secondary | ICD-10-CM

## 2016-08-23 DIAGNOSIS — R7303 Prediabetes: Secondary | ICD-10-CM

## 2016-08-23 LAB — POCT GLYCOSYLATED HEMOGLOBIN (HGB A1C): HEMOGLOBIN A1C: 6

## 2016-08-23 LAB — BASIC METABOLIC PANEL
BUN / CREAT RATIO: 7 — AB (ref 9–20)
BUN: 13 mg/dL (ref 6–24)
CHLORIDE: 98 mmol/L (ref 96–106)
CO2: 26 mmol/L (ref 20–29)
Calcium: 9.1 mg/dL (ref 8.7–10.2)
Creatinine, Ser: 1.74 mg/dL — ABNORMAL HIGH (ref 0.76–1.27)
GFR calc Af Amer: 51 mL/min/{1.73_m2} — ABNORMAL LOW (ref >59)
GFR calc non Af Amer: 44 mL/min/{1.73_m2} — ABNORMAL LOW (ref >59)
Glucose: 93 mg/dL (ref 65–99)
POTASSIUM: 4.4 mmol/L (ref 3.5–5.2)
SODIUM: 142 mmol/L (ref 134–144)

## 2016-08-23 MED ORDER — LOSARTAN POTASSIUM 25 MG PO TABS: 25.0000 mg | ORAL_TABLET | Freq: Two times a day (BID) | ORAL | 3 refills | Status: DC

## 2016-08-23 MED ORDER — ASPIRIN EC 81 MG PO TBEC: 81.0000 mg | DELAYED_RELEASE_TABLET | Freq: Every day | ORAL | 3 refills | Status: DC

## 2016-08-23 NOTE — Patient Instructions (Addendum)
Heart Failure Heart failure means your heart has trouble pumping blood. This makes it hard for your body to work well. Heart failure is usually a long-term (chronic) condition. You must take good care of yourself and follow your doctor's treatment plan. Follow these instructions at home:  Take your heart medicine as told by your doctor. ? Do not stop taking medicine unless your doctor tells you to. ? Do not skip any dose of medicine. ? Refill your medicines before they run out. ? Take other medicines only as told by your doctor or pharmacist.  Stay active if told by your doctor. The elderly and people with severe heart failure should talk with a doctor about physical activity.  Eat heart-healthy foods. Choose foods that are without trans fat and are low in saturated fat, cholesterol, and salt (sodium). This includes fresh or frozen fruits and vegetables, fish, lean meats, fat-free or low-fat dairy foods, whole grains, and high-fiber foods. Lentils and dried peas and beans (legumes) are also good choices.  Limit salt if told by your doctor.  Cook in a healthy way. Roast, grill, broil, bake, poach, steam, or stir-fry foods.  Limit fluids as told by your doctor.  Weigh yourself every morning. Do this after you pee (urinate) and before you eat breakfast. Write down your weight to give to your doctor.  Take your blood pressure and write it down if your doctor tells you to.  Ask your doctor how to check your pulse. Check your pulse as told.  Lose weight if told by your doctor.  Stop smoking or chewing tobacco. Do not use gum or patches that help you quit without your doctor's approval.  Schedule and go to doctor visits as told.  Nonpregnant women should have no more than 1 drink a day. Men should have no more than 2 drinks a day. Talk to your doctor about drinking alcohol.  Stop illegal drug use.  Stay current with shots (immunizations).  Manage your health conditions as told by your  doctor.  Learn to manage your stress.  Rest when you are tired.  If it is really hot outside: ? Avoid intense activities. ? Use air conditioning or fans, or get in a cooler place. ? Avoid caffeine and alcohol. ? Wear loose-fitting, lightweight, and light-colored clothing.  If it is really cold outside: ? Avoid intense activities. ? Layer your clothing. ? Wear mittens or gloves, a hat, and a scarf when going outside. ? Avoid alcohol.  Learn about heart failure and get support as needed.    Get help to maintain or improve your quality of life and your ability to care for yourself as needed. Contact a doctor if:  You gain weight quickly.  You are more short of breath than usual.  You cannot do your normal activities.  You tire easily.  You cough more than normal, especially with activity.  You have any or more puffiness (swelling) in areas such as your hands, feet, ankles, or belly (abdomen).  You cannot sleep because it is hard to breathe.  You feel like your heart is beating fast (palpitations).  You get dizzy or light-headed when you stand up. Get help right away if:  You have trouble breathing.  There is a change in mental status, such as becoming less alert or not being able to focus.  You have chest pain or discomfort.  You faint. This information is not intended to replace advice given to you by your health care provider. Make  sure you discuss any questions you have with your health care provider. Document Released: 11/15/2007 Document Revised: 07/14/2015 Document Reviewed: 03/24/2012 Elsevier Interactive Patient Education  2017 [employer]   Hypertension Hypertension, commonly called high blood pressure, is when the force of blood pumping through the arteries is too strong. The arteries are the blood vessels that carry blood from the heart throughout the body. Hypertension forces the heart to work harder to pump blood and may cause arteries to become  narrow or stiff. Having untreated or uncontrolled hypertension can cause heart attacks, strokes, kidney disease, and other problems. A blood pressure reading consists of a higher number over a lower number. Ideally, your blood pressure should be below 120/80. The first ("top") number is called the systolic pressure. It is a measure of the pressure in your arteries as your heart beats. The second ("bottom") number is called the diastolic pressure. It is a measure of the pressure in your arteries as the heart relaxes. What are the causes? The cause of this condition is not known. What increases the risk? Some risk factors for high blood pressure are under your control. Others are not. Factors you can change  Smoking.  Having type 2 diabetes mellitus, high cholesterol, or both.  Not getting enough exercise or physical activity.  Being overweight.  Having too much fat, sugar, calories, or salt (sodium) in your diet.  Drinking too much alcohol. Factors that are difficult or impossible to change  Having chronic kidney disease.  Having a family history of high blood pressure.  Age. Risk increases with age.  Race. You may be at higher risk if you are African-American.  Gender. Men are at higher risk than women before age 73. After age 9, women are at higher risk than men.  Having obstructive sleep apnea.  Stress. What are the signs or symptoms? Extremely high blood pressure (hypertensive crisis) may cause:  Headache.  Anxiety.  Shortness of breath.  Nosebleed.  Nausea and vomiting.  Severe chest pain.  Jerky movements you cannot control (seizures).  How is this diagnosed? This condition is diagnosed by measuring your blood pressure while you are seated, with your arm resting on a surface. The cuff of the blood pressure monitor will be placed directly against the skin of your upper arm at the level of your heart. It should be measured at least twice using the same arm.  Certain conditions can cause a difference in blood pressure between your right and left arms. Certain factors can cause blood pressure readings to be lower or higher than normal (elevated) for a short period of time:  When your blood pressure is higher when you are in a health care provider's office than when you are at home, this is called white coat hypertension. Most people with this condition do not need medicines.  When your blood pressure is higher at home than when you are in a health care provider's office, this is called masked hypertension. Most people with this condition may need medicines to control blood pressure.  If you have a high blood pressure reading during one visit or you have normal blood pressure with other risk factors:  You may be asked to return on a different day to have your blood pressure checked again.  You may be asked to monitor your blood pressure at home for 1 week or longer.  If you are diagnosed with hypertension, you may have other blood or imaging tests to help your health care provider understand  your overall risk for other conditions. How is this treated? This condition is treated by making healthy lifestyle changes, such as eating healthy foods, exercising more, and reducing your alcohol intake. Your health care provider may prescribe medicine if lifestyle changes are not enough to get your blood pressure under control, and if:  Your systolic blood pressure is above 130.  Your diastolic blood pressure is above 80.  Your personal target blood pressure may vary depending on your medical conditions, your age, and other factors. Follow these instructions at home: Eating and drinking  Eat a diet that is high in fiber and potassium, and low in sodium, added sugar, and fat. An example eating plan is called the DASH (Dietary Approaches to Stop Hypertension) diet. To eat this way: ? Eat plenty of fresh fruits and vegetables. Try to fill half of your plate at  each meal with fruits and vegetables. ? Eat whole grains, such as whole wheat pasta, brown rice, or whole grain bread. Fill about one quarter of your plate with whole grains. ? Eat or drink low-fat dairy products, such as skim milk or low-fat yogurt. ? Avoid fatty cuts of meat, processed or cured meats, and poultry with skin. Fill about one quarter of your plate with lean proteins, such as fish, chicken without skin, beans, eggs, and tofu. ? Avoid premade and processed foods. These tend to be higher in sodium, added sugar, and fat.  Reduce your daily sodium intake. Most people with hypertension should eat less than 1,500 mg of sodium a day.  Limit alcohol intake to no more than 1 drink a day for nonpregnant women and 2 drinks a day for men. One drink equals 12 oz of beer, 5 oz of wine, or 1 oz of hard liquor. Lifestyle  Work with your health care provider to maintain a healthy body weight or to lose weight. Ask what an ideal weight is for you.  Get at least 30 minutes of exercise that causes your heart to beat faster (aerobic exercise) most days of the week. Activities may include walking, swimming, or biking.  Include exercise to strengthen your muscles (resistance exercise), such as pilates or lifting weights, as part of your weekly exercise routine. Try to do these types of exercises for 30 minutes at least 3 days a week.  Do not use any products that contain nicotine or tobacco, such as cigarettes and e-cigarettes. If you need help quitting, ask your health care provider.  Monitor your blood pressure at home as told by your health care provider.  Keep all follow-up visits as told by your health care provider. This is important. Medicines  Take over-the-counter and prescription medicines only as told by your health care provider. Follow directions carefully. Blood pressure medicines must be taken as prescribed.  Do not skip doses of blood pressure medicine. Doing this puts you at risk  for problems and can make the medicine less effective.  Ask your health care provider about side effects or reactions to medicines that you should watch for. Contact a health care provider if:  You think you are having a reaction to a medicine you are taking.  You have headaches that keep coming back (recurring).  You feel dizzy.  You have swelling in your ankles.  You have trouble with your vision. Get help right away if:  You develop a severe headache or confusion.  You have unusual weakness or numbness.  You feel faint.  You have severe pain in your chest  or abdomen.  You vomit repeatedly.  You have trouble breathing. Summary  Hypertension is when the force of blood pumping through your arteries is too strong. If this condition is not controlled, it may put you at risk for serious complications.  Your personal target blood pressure may vary depending on your medical conditions, your age, and other factors. For most people, a normal blood pressure is less than 120/80.  Hypertension is treated with lifestyle changes, medicines, or a combination of both. Lifestyle changes include weight loss, eating a healthy, low-sodium diet, exercising more, and limiting alcohol. This information is not intended to replace advice given to you by your health care provider. Make sure you discuss any questions you have with your health care provider. Document Released: 02/05/2005 Document Revised: 01/04/2016 Document Reviewed: 01/04/2016 Elsevier Interactive Patient Education  2018 [employer].   Prediabetes Prediabetes is the condition of having a blood sugar (blood glucose) level that is higher than it should be, but not high enough for you to be diagnosed with type 2 diabetes. Having prediabetes puts you at risk for developing type 2 diabetes (type 2 diabetes mellitus). Prediabetes may be called impaired glucose tolerance or impaired fasting glucose. Prediabetes usually does not cause  symptoms. Your health care provider can diagnose this condition with blood tests. You may be tested for prediabetes if you are overweight and if you have at least one other risk factor for prediabetes. Risk factors for prediabetes include:  Having a family member with type 2 diabetes.  Being overweight or obese.  Being older than age 50.  Being of American-Indian, African-American, Hispanic/Latino, or Asian/Pacific Islander descent.  Having an inactive (sedentary) lifestyle.  Having a history of gestational diabetes or polycystic ovarian syndrome (PCOS).  Having low levels of good cholesterol (HDL-C) or high levels of blood fats (triglycerides).  Having high blood pressure.  What is blood glucose and how is blood glucose measured?  Blood glucose refers to the amount of glucose in your bloodstream. Glucose comes from eating foods that contain sugars and starches (carbohydrates) that the body breaks down into glucose. Your blood glucose level may be measured in mg/dL (milligrams per deciliter) or mmol/L (millimoles per liter).Your blood glucose may be checked with one or more of the following blood tests:  A fasting blood glucose (FBG) test. You will not be allowed to eat (you will fast) for at least 8 hours before a blood sample is taken. ? A normal range for FBG is 70-100 mg/dl (3.6-6.4 mmol/L).  An A1c (hemoglobin A1c) blood test. This test provides information about blood glucose control over the previous 2?46months.  An oral glucose tolerance test (OGTT). This test measures your blood glucose twice: ? After fasting. This is your baseline level. ? Two hours after you drink a beverage that contains glucose.  You may be diagnosed with prediabetes:  If your FBG is 100?125 mg/dL (4.0-3.4 mmol/L).  If your A1c level is 5.7?6.4%.  If your OGGT result is 140?199 mg/dL (7.4-25 mmol/L).  These blood tests may be repeated to confirm your diagnosis. What happens if blood glucose is  too high? The pancreas produces a hormone (insulin) that helps move glucose from the bloodstream into cells. When cells in the body do not respond properly to insulin that the body makes (insulin resistance), excess glucose builds up in the blood instead of going into cells. As a result, high blood glucose (hyperglycemia) can develop, which can cause many complications. This is a symptom of prediabetes. What  can happen if blood glucose stays higher than normal for a long time? Having high blood glucose for a long time is dangerous. Too much glucose in your blood can damage your nerves and blood vessels. Long-term damage can lead to complications from diabetes, which may include:  Heart disease.  Stroke.  Blindness.  Kidney disease.  Depression.  Poor circulation in the feet and legs, which could lead to surgical removal (amputation) in severe cases.  How can prediabetes be prevented from turning into type 2 diabetes?  To help prevent type 2 diabetes, take the following actions:  Be physically active. ? Do moderate-intensity physical activity for at least 30 minutes on at least 5 days of the week, or as much as told by your health care provider. This could be brisk walking, biking, or water aerobics. ? Ask your health care provider what activities are safe for you. A mix of physical activities may be best, such as walking, swimming, cycling, and strength training.  Lose weight as told by your health care provider. ? Losing 5-7% of your body weight can reverse insulin resistance. ? Your health care provider can determine how much weight loss is best for you and can help you lose weight safely.  Follow a healthy meal plan. This includes eating lean proteins, complex carbohydrates, fresh fruits and vegetables, low-fat dairy products, and healthy fats. ? Follow instructions from your health care provider about eating or drinking restrictions. ? Make an appointment to see a diet and  nutrition specialist (registered dietitian) to help you create a healthy eating plan that is right for you.  Do not smoke or use any tobacco products, such as cigarettes, chewing tobacco, and e-cigarettes. If you need help quitting, ask your health care provider.  Take over-the-counter and prescription medicines as told by your health care provider. You may be prescribed medicines that help lower the risk of type 2 diabetes.  This information is not intended to replace advice given to you by your health care provider. Make sure you discuss any questions you have with your health care provider. Document Released: 05/30/2015 Document Revised: 07/14/2015 Document Reviewed: 03/29/2015 Elsevier Interactive Patient Education  2018 [employer].  A1c today 6.0- need to decrease sugar intake and increase regular movement to prevent the development of diabetes. Continue all medications as directed. Continue close follow-up with cardiology. Follow-up every 3 months, sooner if needed. WELCOME TO THE PRACTICE.

## 2016-08-23 NOTE — Progress Notes (Signed)
Subjective:    Patient ID: Norman Dunk., male    DOB: Oct 04, 1965, 51 y.o.   MRN: [id number]  HPI:  Norman Jones is here to establish as a new pt.  He is a very pleasant 51 year old make.  PMH:  HTN, CHF with recent ED visit 08/20/16 due to dyspnea and lower extremity edema. He reports only previous medical hx prior to 08/20/16 ED visit was HTN and "some swelling in my legs every once in awhile".   08/20/16 ED notes: Shortness of Breath  This is a recurrent problem. The average episode lasts 3 months. The problem occurs intermittently.The current episode started more than 1 week ago. The problem has been gradually worsening. Associated symptoms include PND, orthopnea and leg swelling. Pertinent negatives include no fever, no rhinorrhea, no sore throat, no ear pain, no cough, no wheezing, no chest pain, no syncope, no vomiting, no abdominal pain and no rash. He has tried nothing for the symptoms. Associated medical issues do not include past MI.   08/23/16 Cards notes: History of Present Illness: Norman Jones. is a 51 y.o. male who presents for evaluation of acute on chronic systolic HF.   The patient has a history of CHF.  I was able to review an echo from 2014 at which time his EF was 27%.  He had an exercise perfusion study with a dense inferior scar.  He was in the ED with increased dyspnea and edema.  I reviewed these records for this visit.    He had not been taking his meds.  He had mild CHF on CXR and BNP was elevated.  He was treated with IV diuresis and discharged on PO Lasix from the ED.     He reports starting in April he been having increasing dyspnea with mild exertion and having to sleep at times in the chair with clear PND and orthopnea. He said that after getting the Lasix in the emergency room the first good night's sleep he had in a while. He was able to go to work yesterday without significant shortness of breath. He still has leg edema. The patient denies any new symptoms such as  chest discomfort, neck or arm discomfort. There have been no reported palpitations, presyncope or syncope.  He reports lower ext edema and dyspnea episode 2014 and he was treated with antihypertensive and diuretic.  He has not had regular health care since 2015 due to lack of finances/insuance.   He has been taking furosemide 40mg  daily and was started on Losartan 71m BID by cards today.  He was changed from 325mg  to 81 mg ASA per cards.   He is diuresing well, wt at ED on 08/20/16 was 193 lbs, today wt is 176 lbs  He reports "sleeping better than I have in months" last night.  He reports sleeping on pillows with fan oscillating on his face.  He report smoking 1/2 pack a day which is a recent reduction from pack/day and taking "one shot at night to help me sleep".   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 He denies regular exercise, however is interested in beginning a swimming regime at [employer].   Review of Systems  Constitutional: Positive for activity change, fatigue and unexpected weight change. Negative for appetite change, chills, diaphoresis and fever.  Eyes: Negative for visual disturbance.  Respiratory: Negative for cough, chest tightness, shortness of breath, wheezing and stridor.   Cardiovascular: Negative for  chest pain, palpitations and leg swelling.  Gastrointestinal: Negative for abdominal distention, abdominal pain, blood in stool, constipation, diarrhea, nausea and vomiting.  Endocrine: Negative for cold intolerance, heat intolerance, polydipsia, polyphagia and polyuria.  Genitourinary: Negative for difficulty urinating, flank pain and hematuria.  Musculoskeletal: Positive for gait problem. Negative for arthralgias, back pain, joint swelling, myalgias, neck pain and neck stiffness.  Skin: Negative for color change, pallor, rash and wound.  Allergic/Immunologic: Negative for immunocompromised state.  Neurological: Negative for dizziness and  headaches.  Hematological: Does not bruise/bleed easily.  Psychiatric/Behavioral: Positive for sleep disturbance. Negative for dysphoric mood, self-injury and suicidal ideas. The patient is not nervous/anxious and is not hyperactive.        Objective:   Physical Exam  Constitutional: He is oriented to person, place, and time. He appears well-developed and well-nourished. No distress.  HENT:  Head: Normocephalic and atraumatic.  Right Ear: External ear normal.  Left Ear: External ear normal.  Eyes: Conjunctivae are normal. Pupils are equal, round, and reactive to light.  Neck: Normal range of motion. Neck supple.  Cardiovascular: Regular rhythm and intact distal pulses.  Tachycardia present.   Murmur heard.  Systolic murmur is present with a grade of 3/6  Pulmonary/Chest: Effort normal and breath sounds normal. No respiratory distress. He has no wheezes. He has no rales. He exhibits no tenderness.  Abdominal: Soft. Bowel sounds are normal. He exhibits no distension and no mass. There is tenderness in the left upper quadrant and left lower quadrant. There is no rigidity, no rebound, no guarding, no CVA tenderness, no tenderness at McBurney's point and negative Murphy's sign.  Musculoskeletal: Normal range of motion.  Ambulates with limp  Lymphadenopathy:    He has no cervical adenopathy.  Neurological: He is alert and oriented to person, place, and time. Coordination normal.  Skin: Skin is warm, dry and intact. No rash noted. He is not diaphoretic. No erythema. No pallor.  No significant edema in lower extremities noted. Lower extremities excessively dry skin, no open tissue noted.   Psychiatric: He has a normal mood and affect. His behavior is normal. Judgment and thought content normal.          Assessment & Plan:   1. Family history of diabetes mellitus   2. Hypertension, unspecified type   3. Prediabetes   4. Acute systolic congestive heart failure (HCC)   5. Healthcare  maintenance   6. Dry skin     Hypertension BP 133/97, HR 112. Cards d/c'd Toprol 25mg  daily and started him on Losartan 25mg  BID. He has ECHO and cards follow-up next week.      Prediabetes A1c today 6.0 Brother has T2D. Diabetic diet and exercise information provided.    CHF (congestive heart failure)  Ref Range & Units 3d ago  B Natriuretic Peptide 0.0 - 100.0 pg/mL 3,418.9    Resulting Agency  [facility]    Specimen Collected: 08/20/16 11:51 Last Resulted: 08/20/16 17:23       On furosemide 40mg  daily and Losartan 25mg  BID. Followed by cards, has ECHO next week.  Healthcare maintenance Increase regular exercise-as tolerated. Continue close follow-up by cards. Discussed smoking cessation, continue to reduce to full stop. Follow-up in 3 months, sooner if needed.   Dry skin Noted on bil lower extremities. Recommended applying Lubriderm daily.    FOLLOW-UP:  Return in about 3 months (around 11/23/2016) for Regular Follow Up, HTN, CHF.

## 2016-08-23 NOTE — Assessment & Plan Note (Signed)
A1c today 6.0 Brother has T2D. Diabetic diet and exercise information provided.

## 2016-08-23 NOTE — Patient Instructions (Signed)
Medication Instructions:  START losartan (Cozaar) 25mg  two times daily START Aspirin 81 mg daily  Labwork: BMET today and repeat in 1 week (BMET) when seeing Corine Shelter PA  Testing/Procedures: Your physician has requested that you have an echocardiogram ASAP (before seeing Corine Shelter PA). Echocardiography is a painless test that uses sound waves to create images of your heart. It provides your doctor with information about the size and shape of your heart and how well your heart's chambers and valves are working. This procedure takes approximately one hour. There are no restrictions for this procedure. -this will be done at our [facility]: [address]  Follow-Up: Your physician recommends that you schedule a follow-up appointment in: 1 week with Corine Shelter PA    Any Other Special Instructions Will Be Listed Below (If Applicable).   Heart Failure Heart failure means your heart has trouble pumping blood. This makes it hard for your body to work well. Heart failure is usually a long-term (chronic) condition. You must take good care of yourself and follow your doctor's treatment plan. Follow these instructions at home:  Take your heart medicine as told by your doctor. ? Do not stop taking medicine unless your doctor tells you to. ? Do not skip any dose of medicine. ? Refill your medicines before they run out. ? Take other medicines only as told by your doctor or pharmacist.  Stay active if told by your doctor. The elderly and people with severe heart failure should talk with a doctor about physical activity.  Eat heart-healthy foods. Choose foods that are without trans fat and are low in saturated fat, cholesterol, and salt (sodium). This includes fresh or frozen fruits and vegetables, fish, lean meats, fat-free or low-fat dairy foods, whole grains, and high-fiber foods. Lentils and dried peas and beans (legumes) are also good choices.  Limit salt if told  by your doctor.  Cook in a healthy way. Roast, grill, broil, bake, poach, steam, or stir-fry foods.  Limit fluids as told by your doctor.  Weigh yourself every morning. Do this after you pee (urinate) and before you eat breakfast. Write down your weight to give to your doctor.  Take your blood pressure and write it down if your doctor tells you to.  Ask your doctor how to check your pulse. Check your pulse as told.  Lose weight if told by your doctor.  Stop smoking or chewing tobacco. Do not use gum or patches that help you quit without your doctor's approval.  Schedule and go to doctor visits as told.  Nonpregnant women should have no more than 1 drink a day. Men should have no more than 2 drinks a day. Talk to your doctor about drinking alcohol.  Stop illegal drug use.  Stay current with shots (immunizations).  Manage your health conditions as told by your doctor.  Learn to manage your stress.  Rest when you are tired.  If it is really hot outside: ? Avoid intense activities. ? Use air conditioning or fans, or get in a cooler place. ? Avoid caffeine and alcohol. ? Wear loose-fitting, lightweight, and light-colored clothing.  If it is really cold outside: ? Avoid intense activities. ? Layer your clothing. ? Wear mittens or gloves, a hat, and a scarf when going outside. ? Avoid alcohol.  Learn about heart failure and get support as needed.  Get help to maintain or improve your quality of life and your ability to care for yourself  as needed. Contact a doctor if:  You gain weight quickly.  You are more short of breath than usual.  You cannot do your normal activities.  You tire easily.  You cough more than normal, especially with activity.  You have any or more puffiness (swelling) in areas such as your hands, feet, ankles, or belly (abdomen).  You cannot sleep because it is hard to breathe.  You feel like your heart is beating fast (palpitations).  You  get dizzy or light-headed when you stand up. Get help right away if:  You have trouble breathing.  There is a change in mental status, such as becoming less alert or not being able to focus.  You have chest pain or discomfort.  You faint. This information is not intended to replace advice given to you by your health care provider. Make sure you discuss any questions you have with your health care provider. Document Released: 11/15/2007 Document Revised: 07/14/2015 Document Reviewed: 03/24/2012 Elsevier Interactive Patient Education  2017 Elsevier Inc.    DASH Eating Plan DASH stands for "Dietary Approaches to Stop Hypertension." The DASH eating plan is a healthy eating plan that has been shown to reduce high blood pressure (hypertension). It may also reduce your risk for type 2 diabetes, heart disease, and stroke. The DASH eating plan may also help with weight loss. What are tips for following this plan? General guidelines  Avoid eating more than 2,300 mg (milligrams) of salt (sodium) a day. If you have hypertension, you may need to reduce your sodium intake to 1,500 mg a day.  Limit alcohol intake to no more than 1 drink a day for nonpregnant women and 2 drinks a day for men. One drink equals 12 oz of beer, 5 oz of wine, or 1 oz of hard liquor.  Work with your health care provider to maintain a healthy body weight or to lose weight. Ask what an ideal weight is for you.  Get at least 30 minutes of exercise that causes your heart to beat faster (aerobic exercise) most days of the week. Activities may include walking, swimming, or biking.  Work with your health care provider or diet and nutrition specialist (dietitian) to adjust your eating plan to your individual calorie needs. Reading food labels  Check food labels for the amount of sodium per serving. Choose foods with less than 5 percent of the Daily Value of sodium. Generally, foods with less than 300 mg of sodium per serving fit  into this eating plan.  To find whole grains, look for the word "whole" as the first word in the ingredient list. Shopping  Buy products labeled as "low-sodium" or "no salt added."  Buy fresh foods. Avoid canned foods and premade or frozen meals. Cooking  Avoid adding salt when cooking. Use salt-free seasonings or herbs instead of table salt or sea salt. Check with your health care provider or pharmacist before using salt substitutes.  Do not fry foods. Cook foods using healthy methods such as baking, boiling, grilling, and broiling instead.  Cook with heart-healthy oils, such as olive, canola, soybean, or sunflower oil. Meal planning   Eat a balanced diet that includes: ? 5 or more servings of fruits and vegetables each day. At each meal, try to fill half of your plate with fruits and vegetables. ? Up to 6-8 servings of whole grains each day. ? Less than 6 oz of lean meat, poultry, or fish each day. A 3-oz serving of meat is about the  same size as a deck of cards. One egg equals 1 oz. ? 2 servings of low-fat dairy each day. ? A serving of nuts, seeds, or beans 5 times each week. ? Heart-healthy fats. Healthy fats called Omega-3 fatty acids are found in foods such as flaxseeds and coldwater fish, like sardines, salmon, and mackerel.  Limit how much you eat of the following: ? Canned or prepackaged foods. ? Food that is high in trans fat, such as fried foods. ? Food that is high in saturated fat, such as fatty meat. ? Sweets, desserts, sugary drinks, and other foods with added sugar. ? Full-fat dairy products.  Do not salt foods before eating.  Try to eat at least 2 vegetarian meals each week.  Eat more home-cooked food and less restaurant, buffet, and fast food.  When eating at a restaurant, ask that your food be prepared with less salt or no salt, if possible. What foods are recommended? The items listed may not be a complete list. Talk with your dietitian about what dietary  choices are best for you. Grains Whole-grain or whole-wheat bread. Whole-grain or whole-wheat pasta. Brown rice. Orpah Cobb. Bulgur. Whole-grain and low-sodium cereals. Pita bread. Low-fat, low-sodium crackers. Whole-wheat flour tortillas. Vegetables Fresh or frozen vegetables (raw, steamed, roasted, or grilled). Low-sodium or reduced-sodium tomato and vegetable juice. Low-sodium or reduced-sodium tomato sauce and tomato paste. Low-sodium or reduced-sodium canned vegetables. Fruits All fresh, dried, or frozen fruit. Canned fruit in natural juice (without added sugar). Meat and other protein foods Skinless chicken or [place]. Ground chicken or [place]. Pork with fat trimmed off. Fish and seafood. Egg whites. Dried beans, peas, or lentils. Unsalted nuts, nut butters, and seeds. Unsalted canned beans. Lean cuts of beef with fat trimmed off. Low-sodium, lean deli meat. Dairy Low-fat (1%) or fat-free (skim) milk. Fat-free, low-fat, or reduced-fat cheeses. Nonfat, low-sodium ricotta or cottage cheese. Low-fat or nonfat yogurt. Low-fat, low-sodium cheese. Fats and oils Soft margarine without trans fats. Vegetable oil. Low-fat, reduced-fat, or light mayonnaise and salad dressings (reduced-sodium). Canola, safflower, olive, soybean, and sunflower oils. Avocado. Seasoning and other foods Herbs. Spices. Seasoning mixes without salt. Unsalted popcorn and pretzels. Fat-free sweets. What foods are not recommended? The items listed may not be a complete list. Talk with your dietitian about what dietary choices are best for you. Grains Baked goods made with fat, such as croissants, muffins, or some breads. Dry pasta or rice meal packs. Vegetables Creamed or fried vegetables. Vegetables in a cheese sauce. Regular canned vegetables (not low-sodium or reduced-sodium). Regular canned tomato sauce and paste (not low-sodium or reduced-sodium). Regular tomato and vegetable juice (not low-sodium or reduced-sodium).  Rosita Fire. Olives. Fruits Canned fruit in a light or heavy syrup. Fried fruit. Fruit in cream or butter sauce. Meat and other protein foods Fatty cuts of meat. Ribs. Fried meat. Tomasa Blase. Sausage. Bologna and other processed lunch meats. Salami. Fatback. Hotdogs. Bratwurst. Salted nuts and seeds. Canned beans with added salt. Canned or smoked fish. Whole eggs or egg yolks. Chicken or [place] with skin. Dairy Whole or 2% milk, cream, and half-and-half. Whole or full-fat cream cheese. Whole-fat or sweetened yogurt. Full-fat cheese. Nondairy creamers. Whipped toppings. Processed cheese and cheese spreads. Fats and oils Butter. Stick margarine. Lard. Shortening. Ghee. Bacon fat. Tropical oils, such as coconut, palm kernel, or palm oil. Seasoning and other foods Salted popcorn and pretzels. Onion salt, garlic salt, seasoned salt, table salt, and sea salt. Worcestershire sauce. Tartar sauce. Barbecue sauce. Teriyaki sauce. Soy sauce, including  reduced-sodium. Steak sauce. Canned and packaged gravies. Fish sauce. Oyster sauce. Cocktail sauce. Horseradish that you find on the shelf. Ketchup. Mustard. Meat flavorings and tenderizers. Bouillon cubes. Hot sauce and Tabasco sauce. Premade or packaged marinades. Premade or packaged taco seasonings. Relishes. Regular salad dressings. Where to find more information:  National Heart, Lung, and Blood Institute: PopSteam.is  American Heart Association: www.heart.org Summary  The DASH eating plan is a healthy eating plan that has been shown to reduce high blood pressure (hypertension). It may also reduce your risk for type 2 diabetes, heart disease, and stroke.  With the DASH eating plan, you should limit salt (sodium) intake to 2,300 mg a day. If you have hypertension, you may need to reduce your sodium intake to 1,500 mg a day.  When on the DASH eating plan, aim to eat more fresh fruits and vegetables, whole grains, lean proteins, low-fat dairy, and  heart-healthy fats.  Work with your health care provider or diet and nutrition specialist (dietitian) to adjust your eating plan to your individual calorie needs. This information is not intended to replace advice given to you by your health care provider. Make sure you discuss any questions you have with your health care provider. Document Released: 01/25/2011 Document Revised: 01/30/2016 Document Reviewed: 01/30/2016 Elsevier Interactive Patient Education  2017 Elsevier Inc. Fluid Restriction Some health conditions may require you to restrict your fluid intake. This means that you need to limit the amount of fluid you drink each day. When you have a fluid restriction, you must carefully measure and keep track of the amount of fluid you drink. Your health care provider will identify the specific amount of fluid you are allowed each day. This amount may depend on several things, such as:  The amount of urine you produce in a day.  How much fluid you are keeping (retaining) in your body.  Your blood pressure.  What is my plan? Your health care provider recommends that you limit your fluid intake to __________ per day. What counts toward my fluid intake? Your fluid intake includes all liquids that you drink, as well as any foods that become liquid at room temperature. The following are examples of some fluids that you will have to restrict:  Tea, coffee, soda, lemonade, milk, water, juice, sport drinks, and nutritional supplement beverages.  Alcoholic beverages.  Cream.  Gravy.  Ice cubes.  Soup and broth.  The following are examples of foods that become liquid at room temperature. These foods will also count toward your fluid intake.  Ice cream and ice milk.  Frozen yogurt and sherbet.  Frozen ice pops.  Flavored gelatin.  How do I keep track of my fluid intake? Each morning, fill a jug with the amount of water that equals the amount of fluid you are allowed for the day.  You can use this water as a guideline for fluid allowance. Each time you take in any form of fluid, including ice cubes and foods that become liquid at room temperature, pour an equal amount of water out of the container. This helps you to see how much fluid you are taking in. It also helps you to see how much of your fluid intake is left for the rest of the day. The following conversions may also be helpful in measuring your fluid intake:  1 cup equals 8 oz (240 mL).   cup equals 6 oz (180 mL).  ? cup equals 5? oz (160 mL).   cup equals 4 oz (  120 mL).  ? cup equals 2? oz (80 mL).   cup equals 2 oz (60 mL).  2 Tbsp equals 1 oz (30 mL).  What home care instructions should I follow while restricting fluids?  Make sure that you stay within the recommended limit each day. Always measure and keep track of your fluids, as well as any foods that turn liquid at room temperature.  Use small cups and glasses and learn to sip fluids slowly.  Add a slice of fresh lemon or lemon juice to water or ice. This helps to satisfy your thirst.  Freeze fruit juice or water in an ice cube tray. Use this as part of your fluid allowance. These cubes are useful for quenching your thirst. Measure the amount of liquid in each ice cube prior to freezing so you can subtract this amount from your day's allowance when you consume each frozen cube.  Try frozen fruits between meals, such as grapes or strawberries.  Swallow your pills along with meals or soft foods, such as applesauce or mashed potatoes. This helps you to save your fluid allowance for something that you enjoy.  Weigh yourself every day. Keeping track of your daily weight can help you and your health care provider to notice as soon as possible if you are retaining too much fluid in your body. ? Weigh yourself every morning after you urinate but before you eat breakfast. ? Wear the same amount of clothing each time you weigh yourself. ? Write down  your daily weight. Give this weight record to your health care provider. If your weight is going up, you may be retaining too much fluid. Every 2 cups (480 mL) of fluid retained in the body becomes an extra 1 lb (0.45 kg) of body weight.  Avoid salty foods. These foods make you thirsty and make fluid control more difficult.  Brush your teeth often or rinse your mouth with mouthwash to help your dry mouth. Lemon wedges, hard sour candies, chewing gum, or breath spray may also help to moisten your mouth.  Keep the temperature in your home at a cooler level. Dry air increases thirst, so keep the air in your home as humid as possible.  Avoid being out in the hot sun, which can cause you to sweat and become thirsty. What are some signs that I may be taking in too much fluid? You may be taking in too much fluid if:  Your weight increases. Contact your health care provider if your weight increases 3 lb or more in a day or if it increases 5 lb or more in a week.  Your face, hands, legs, feet, and belly (abdomen) start to swell.  You have trouble breathing.  This information is not intended to replace advice given to you by your health care provider. Make sure you discuss any questions you have with your health care provider. Document Released: 12/03/2006 Document Revised: 07/14/2015 Document Reviewed: 07/07/2013 Elsevier Interactive Patient Education  [employer].  If you need a refill on your cardiac medications before your next appointment, please call your pharmacy.

## 2016-08-23 NOTE — Assessment & Plan Note (Signed)
Ref Range & Units 3d ago  B Natriuretic Peptide 0.0 - 100.0 pg/mL 3,418.9    Resulting Agency  [facility]    Specimen Collected: 08/20/16 11:51 Last Resulted: 08/20/16 17:23       On furosemide 40mg  daily and Losartan 25mg  BID. Followed by cards, has ECHO next week.

## 2016-08-23 NOTE — Assessment & Plan Note (Signed)
Increase regular exercise-as tolerated. Continue close follow-up by cards. Discussed smoking cessation, continue to reduce to full stop. Follow-up in 3 months, sooner if needed.

## 2016-08-23 NOTE — Assessment & Plan Note (Signed)
BP 133/97, HR 112. Cards d/c'd Toprol 25mg  daily and started him on Losartan 25mg  BID. He has ECHO and cards follow-up next week.

## 2016-08-23 NOTE — Assessment & Plan Note (Signed)
Noted on bil lower extremities. Recommended applying Lubriderm daily.

## 2016-08-24 ENCOUNTER — Other Ambulatory Visit: Payer: Self-pay

## 2016-08-24 ENCOUNTER — Ambulatory Visit (HOSPITAL_COMMUNITY): Payer: BLUE CROSS/BLUE SHIELD | Attending: Cardiology

## 2016-08-24 ENCOUNTER — Telehealth (HOSPITAL_COMMUNITY): Payer: Self-pay | Admitting: Cardiology

## 2016-08-24 DIAGNOSIS — I5021 Acute systolic (congestive) heart failure: Secondary | ICD-10-CM | POA: Diagnosis not present

## 2016-08-24 DIAGNOSIS — R7303 Prediabetes: Secondary | ICD-10-CM | POA: Diagnosis not present

## 2016-08-24 DIAGNOSIS — N182 Chronic kidney disease, stage 2 (mild): Secondary | ICD-10-CM | POA: Diagnosis not present

## 2016-08-24 DIAGNOSIS — I130 Hypertensive heart and chronic kidney disease with heart failure and stage 1 through stage 4 chronic kidney disease, or unspecified chronic kidney disease: Secondary | ICD-10-CM | POA: Diagnosis not present

## 2016-08-24 DIAGNOSIS — I081 Rheumatic disorders of both mitral and tricuspid valves: Secondary | ICD-10-CM | POA: Diagnosis not present

## 2016-08-24 LAB — ECHOCARDIOGRAM COMPLETE
AOASC: 36 cm
CHL CUP DOP CALC LVOT VTI: 11.6 cm
FS: 10 % — AB (ref 28–44)
IV/PV OW: 1
LA ID, A-P, ES: 52 mm
LA diam end sys: 52 mm
LA diam index: 2.68 cm/m2
LA vol A4C: 64.9 ml
LA vol index: 36 mL/m2
LAVOL: 69.8 mL
LDCA: 3.14 cm2
LV PW d: 10 mm — AB (ref 0.6–1.1)
LVOT diameter: 20 mm
LVOTPV: 80.4 cm/s
LVOTSV: 36 mL
Lateral S' vel: 7.99 cm/s
MRPISAEROA: 0.12 cm2
Reg peak vel: 361 cm/s
TAPSE: 10.1 mm
TRMAXVEL: 361 cm/s
VTI: 141 cm

## 2016-08-24 NOTE — Telephone Encounter (Signed)
08/24/2016 08:58 AM Phone (Outgoing) Norman Jones. (Self) [phone] (H)   Left Message - Called pt and lmsg for him to CB to get scheduled for echo.     By Elita Boone

## 2016-08-28 ENCOUNTER — Encounter: Payer: Self-pay | Admitting: Cardiology

## 2016-08-28 ENCOUNTER — Ambulatory Visit (INDEPENDENT_AMBULATORY_CARE_PROVIDER_SITE_OTHER): Payer: BLUE CROSS/BLUE SHIELD | Admitting: Cardiology

## 2016-08-28 VITALS — BP 116/86 | HR 78 | Ht 68.0 in | Wt 164.4 lb

## 2016-08-28 DIAGNOSIS — Z79899 Other long term (current) drug therapy: Secondary | ICD-10-CM

## 2016-08-28 DIAGNOSIS — N183 Chronic kidney disease, stage 3 unspecified: Secondary | ICD-10-CM

## 2016-08-28 DIAGNOSIS — I10 Essential (primary) hypertension: Secondary | ICD-10-CM | POA: Diagnosis not present

## 2016-08-28 DIAGNOSIS — I5023 Acute on chronic systolic (congestive) heart failure: Secondary | ICD-10-CM

## 2016-08-28 MED ORDER — CARVEDILOL 3.125 MG PO TABS: 3.1250 mg | ORAL_TABLET | Freq: Two times a day (BID) | ORAL | 3 refills | Status: DC

## 2016-08-28 NOTE — Assessment & Plan Note (Signed)
Pt has a history of CHF- prior work up by Dr Jacinto Halim in 2014 revealed NICM EF-27%, (low risk Myoview) then.  ED visit 08/20/16 with recurrent CHF- off medications. Echo 08/24/16 -EF 20-25% with moderate to severe MR and PA pressure of 

## 2016-08-28 NOTE — Progress Notes (Signed)
08/28/2016 Norman Jones.   19-Apr-1965  [id number]  Primary Physician Danford, Jinny Blossom, NP Primary Cardiologist: Dr Virgina Jock  HPI:  51 y/o AA male with a history of NOICM in 2014. He did well on medications but subsequently lost his job when the [employer] office closed. He could not afford his medications. He presented to the ED in April 2018, August 06 2016, and finally 08/20/16 with CHF. He was treated in the ED and his medications adjusted. Dr Antoine Poche saw him in the office 08/23/16. An ARB was added. He is in the office today for follow up. An echo done 08/24/16 shows severe LVD with an EF of 20-25%.  The pt is seeing me today in follow up. 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 He feels better since Dr Antoine Poche adjusted his medications. He has lost 13 lbs. He says he can sleep flat in bed ow and has no edema.    Current Outpatient Prescriptions  Medication Sig Dispense Refill  . aspirin EC 81 MG tablet Take 1 tablet (81 mg total) by mouth daily. 90 tablet 3  . furosemide (LASIX) 40 MG tablet Take 1 tablet (40 mg total) by mouth daily. 14 tablet 0  . losartan (COZAAR) 25 MG tablet Take 1 tablet (25 mg total) by mouth 2 (two) times daily. 60 tablet 3  . carvedilol (COREG) 3.125 MG tablet Take 1 tablet (3.125 mg total) by mouth 2 (two) times daily. 180 tablet 3   No current facility-administered medications for this visit.     Allergies  Allergen Reactions  . Penicillins Swelling    Swelling in groin  Has patient had a PCN reaction causing immediate rash, facial/tongue/throat swelling, SOB or lightheadedness with hypotension: YES Has patient had a PCN reaction causing severe rash involving mucus membranes or skin necrosis: NO Has patient had a PCN reaction that required hospitalization: UNKNOWN Has patient had a PCN reaction occurring within the last 10 years: NO If all of the above answers are "NO", then may proceed with Cephalosporin use.      Past Medical History:  Diagnosis Date  . Asthma   . CHF (congestive heart failure) (HCC) 09/29/2012  . Hypertension   . Seasonal allergies     Social History   Social History  . Marital status: Single    Spouse name: N/A  . Number of children: N/A  . Years of education: N/A   Occupational History  . IT Support    Social History Main Topics  . Smoking status: Current Every Day Smoker    Packs/day: 1.00    Types: Cigarettes  . Smokeless tobacco: Never Used  . Alcohol use Yes     Comment: socially  . Drug use: Yes    Types: Marijuana  . Sexual activity: Yes    Birth control/ protection: None   Other Topics Concern  . Not on file   Social History Narrative   Marital status: divorced      Children: none      Employment: Scientist, product/process development; works from home      Tobacco: 1 ppd      Alcohol:  3 ounces on weekends      Drugs:  None        Family History  Problem Relation Age of Onset  . Cancer Mother        Breast cancer  . Diabetes Maternal Grandmother      Review of Systems: General: negative  for chills, fever, night sweats or weight changes.  Cardiovascular: negative for chest pain, dyspnea on exertion, edema, orthopnea, palpitations, paroxysmal nocturnal dyspnea or shortness of breath Dermatological: negative for rash Respiratory: negative for cough or wheezing Urologic: negative for hematuria Abdominal: negative for nausea, vomiting, diarrhea, bright red blood per rectum, melena, or hematemesis Neurologic: negative for visual changes, syncope, or dizziness All other systems reviewed and are otherwise negative except as noted above.    Blood pressure 116/86, pulse 78, height 5\' 8"  (1.727 m), weight 164 lb 6.4 oz (74.6 kg).  General appearance: alert, cooperative and no distress Neck: elevated JVP on exam Lungs: clear to auscultation bilaterally Heart: regular rate and rhythm and 2/6 MR murmur on exam Extremities: extremities normal, atraumatic, no  cyanosis or edema Skin: Skin color, texture, turgor normal. No rashes or lesions Neurologic: Grossly normal   ASSESSMENT AND PLAN:   Acute on chronic systolic (congestive) heart failure (HCC) Pt has a history of CHF- prior work up by Dr Jacinto Halim in 2014 revealed NICM EF-27%, (low risk Myoview) then.  ED visit 08/20/16 with recurrent CHF- off medications. Echo 08/24/16 -EF 20-25% with moderate to severe MR and PA pressure of  CRI (chronic renal insufficiency), stage 3 (moderate) Check BMP in a week on ARB and diuretic  Hypertension Controlled   PLAN  Continue current medical Rx with the addition of Coreg 3.125 mg BID. Check BMP in a week, office visit with me in 2-3 weeks. Consider transition to [facility] once stable. F/U echo in 3 months.   Corine Shelter PA-C 08/28/2016 4:12 PM

## 2016-08-28 NOTE — Assessment & Plan Note (Signed)
Controlled.  

## 2016-08-28 NOTE — Patient Instructions (Addendum)
Medication Instructions: START Carvedilol (Coreg) 3.125 mg tablet twice daily,  Labwork: Please have the following labs drawn in one week: BMP, BNP   Follow-Up: Your physician recommends that you schedule a follow-up appointment in: 2 to 3 weeks with Corine Shelter, PA    If you need a refill on your cardiac medications before your next appointment, please call your pharmacy.

## 2016-08-28 NOTE — Assessment & Plan Note (Signed)
Check BMP in a week on ARB and diuretic

## 2016-09-03 ENCOUNTER — Telehealth: Payer: Self-pay | Admitting: Cardiology

## 2016-09-03 ENCOUNTER — Telehealth: Payer: Self-pay | Admitting: Adult Health

## 2016-09-03 MED ORDER — FUROSEMIDE 40 MG PO TABS: 40.0000 mg | ORAL_TABLET | Freq: Every day | ORAL | 5 refills | Status: DC

## 2016-09-03 NOTE — Telephone Encounter (Signed)
New Message  Pt call requesting to speak with RN. Pt would like to know if he will need to get a refill on medication furosemide 40mg . Please call back to discuss

## 2016-09-03 NOTE — Telephone Encounter (Signed)
2 weeks ago patient went to the hospital and was given a 14 day supply of furosemide and is now out. He wants to know does he need a refill to continue this med. Please advise.

## 2016-09-03 NOTE — Telephone Encounter (Signed)
Returned call to patient. Norman Jones, [place] note did not recommend changes to current meds (added coreg). PA note states to check labs since on ARB + diuretic. Patient was unsure if she should continue lasix since he only got 14 day supply from hospital. Advised to continue & med refilled.

## 2016-09-03 NOTE — Telephone Encounter (Signed)
Spoke with patient regarding furosemide request.  Advised patient to contact cardiologist regarding medication.

## 2016-09-03 NOTE — Telephone Encounter (Signed)
Afternoon Shannon, Last OV at Cards indicated no change in meds, but to err on side of caution, can you please have him contact his cardiologist in regard to continuing furosemide.  Thanks! Orpha Bur

## 2016-09-03 NOTE — Telephone Encounter (Signed)
Please advise. Thank you MPulliam, CMA/RT(R)  

## 2016-09-07 ENCOUNTER — Telehealth: Payer: Self-pay | Admitting: *Deleted

## 2016-09-07 LAB — BASIC METABOLIC PANEL
BUN/Creatinine Ratio: 13 (ref 9–20)
BUN: 19 mg/dL (ref 6–24)
CO2: 26 mmol/L (ref 20–29)
Calcium: 9.4 mg/dL (ref 8.7–10.2)
Chloride: 101 mmol/L (ref 96–106)
Creatinine, Ser: 1.45 mg/dL — ABNORMAL HIGH (ref 0.76–1.27)
GFR calc Af Amer: 63 mL/min/{1.73_m2} (ref >59)
GFR calc non Af Amer: 55 mL/min/{1.73_m2} — ABNORMAL LOW (ref >59)
Glucose: 72 mg/dL (ref 65–99)
Potassium: 4.4 mmol/L (ref 3.5–5.2)
Sodium: 144 mmol/L (ref 134–144)

## 2016-09-07 LAB — BRAIN NATRIURETIC PEPTIDE: BNP: 884.5 pg/mL — ABNORMAL HIGH (ref 0.0–100.0)

## 2016-09-07 NOTE — Telephone Encounter (Signed)
Left message for pt to call back  °

## 2016-09-07 NOTE — Telephone Encounter (Signed)
-----   Message from [place], [place] sent at 09/07/2016  4:08 PM EDT ----- BNP is elevated suggestive of increased fluid from CHF. Renal function and K stable. Check with patient to see if he has been taking his lasix. From telephone encounters from PCP office, it looks like his diuretics may have been on hold. Please check with patient. Is he short of breath, weight gain, edema??

## 2016-09-11 NOTE — Telephone Encounter (Signed)
Called pt again and pt has bee made aware of his lab results. See result note.

## 2016-09-17 ENCOUNTER — Ambulatory Visit (INDEPENDENT_AMBULATORY_CARE_PROVIDER_SITE_OTHER): Payer: BLUE CROSS/BLUE SHIELD | Admitting: Cardiology

## 2016-09-17 ENCOUNTER — Encounter: Payer: Self-pay | Admitting: Cardiology

## 2016-09-17 VITALS — BP 142/98 | HR 98 | Ht 68.0 in | Wt 165.4 lb

## 2016-09-17 DIAGNOSIS — I5022 Chronic systolic (congestive) heart failure: Secondary | ICD-10-CM | POA: Diagnosis not present

## 2016-09-17 DIAGNOSIS — N183 Chronic kidney disease, stage 3 unspecified: Secondary | ICD-10-CM

## 2016-09-17 DIAGNOSIS — I10 Essential (primary) hypertension: Secondary | ICD-10-CM

## 2016-09-17 DIAGNOSIS — I428 Other cardiomyopathies: Secondary | ICD-10-CM

## 2016-09-17 MED ORDER — SACUBITRIL-VALSARTAN 24-26 MG PO TABS: 1.0000 | ORAL_TABLET | Freq: Two times a day (BID) | ORAL | 3 refills | Status: DC

## 2016-09-17 MED ORDER — CARVEDILOL 6.25 MG PO TABS: 6.2500 mg | ORAL_TABLET | Freq: Two times a day (BID) | ORAL | 3 refills | Status: DC

## 2016-09-17 NOTE — Progress Notes (Signed)
09/17/2016 Norman Jones.   08-Aug-1965  [id number]  Primary Physician Danford, Jinny Blossom, NP Primary Cardiologist: Dr Antoine Poche  HPI:  51 y/o AA male with a history of NICM in 2014. He did well on medications but subsequently lost his job when the [employer] office closed. He could not afford his medications. He presented to the ED in April 2018, August 06 2016, and finally 08/20/16 with CHF. He was treated in the ED and his medications adjusted. Dr Antoine Poche saw him in the office 08/23/16. An ARB was added.  An echo done 08/24/16 shows severe LVD with an EF of 20-25%. I saw him in f/u 08/28/16 and added Coreg. A BMP was done as an OP  09/06/16-K+ 4.4 and his SCR has steadily improved- now down to 1.45. He is in the office today for f/u. He says he "feesl great". No orthopnea or unusual DOE.   .   Current Outpatient Prescriptions  Medication Sig Dispense Refill  . aspirin EC 81 MG tablet Take 1 tablet (81 mg total) by mouth daily. 90 tablet 3  . carvedilol (COREG) 6.25 MG tablet Take 1 tablet (6.25 mg total) by mouth 2 (two) times daily. 180 tablet 3  . furosemide (LASIX) 40 MG tablet Take 1 tablet (40 mg total) by mouth daily. 30 tablet 5  . losartan (COZAAR) 25 MG tablet Take 1 tablet (25 mg total) by mouth 2 (two) times daily. 60 tablet 3  . sacubitril-valsartan (ENTRESTO) 24-26 MG Take 1 tablet by mouth 2 (two) times daily. 60 tablet 3   No current facility-administered medications for this visit.     Allergies  Allergen Reactions  . Penicillins Swelling    Swelling in groin  Has patient had a PCN reaction causing immediate rash, facial/tongue/throat swelling, SOB or lightheadedness with hypotension: YES Has patient had a PCN reaction causing severe rash involving mucus membranes or skin necrosis: NO Has patient had a PCN reaction that required hospitalization: UNKNOWN Has patient had a PCN reaction occurring within the last 10 years: NO If all of the above answers are "NO", then may  proceed with Cephalosporin use.     Past Medical History:  Diagnosis Date  . Asthma   . CHF (congestive heart failure) (HCC) 09/29/2012  . Hypertension   . Seasonal allergies     Social History   Social History  . Marital status: Single    Spouse name: N/A  . Number of children: N/A  . Years of education: N/A   Occupational History  . IT Support    Social History Main Topics  . Smoking status: Current Every Day Smoker    Packs/day: 1.00    Types: Cigarettes  . Smokeless tobacco: Never Used  . Alcohol use Yes     Comment: socially  . Drug use: Yes    Types: Marijuana  . Sexual activity: Yes    Birth control/ protection: None   Other Topics Concern  . Not on file   Social History Narrative   Marital status: divorced      Children: none      Employment: Scientist, product/process development; works from home      Tobacco: 1 ppd      Alcohol:  3 ounces on weekends      Drugs:  None        Family History  Problem Relation Age of Onset  . Cancer Mother        Breast cancer  . Diabetes Maternal  Grandmother      Review of Systems: General: negative for chills, fever, night sweats or weight changes.  Cardiovascular: negative for chest pain, dyspnea on exertion, edema, orthopnea, palpitations, paroxysmal nocturnal dyspnea or shortness of breath Dermatological: negative for rash Respiratory: negative for cough or wheezing Urologic: negative for hematuria Abdominal: negative for nausea, vomiting, diarrhea, bright red blood per rectum, melena, or hematemesis Neurologic: negative for visual changes, syncope, or dizziness All other systems reviewed and are otherwise negative except as noted above.    Blood pressure (!) 142/98, pulse 98, height 5\' 8"  (1.727 m), weight 165 lb 6.4 oz (75 kg).  General appearance: alert, cooperative and no distress Neck: no carotid bruit and no JVD Lungs: clear to auscultation bilaterally Heart: regular rate and rhythm Extremities: extremities normal,  atraumatic, no cyanosis or edema Skin: Skin color, texture, turgor normal. No rashes or lesions Neurologic: Grossly normal   ASSESSMENT AND PLAN:   NICM (nonischemic cardiomyopathy) (HCC) EF 20-25% by echo July 2018  Chronic systolic CHF (congestive heart failure) (HCC) Pt has a history of CHF- prior work up by Dr Jacinto Halim in 2014 revealed NICM EF-27%, (low risk Myoview) then.  ED visit 08/20/16 with recurrent CHF- off medications. We saw him in the office in f/u (now employed) and he has been re started on medications. We are in the process of up titration of his medication  CRI (chronic renal insufficiency), stage 3 (moderate) SCr 1.45 on Cozaar  Hypertension Still room to adjust medications   PLAN  Mr Iaquinto is doing better though his B/P is still high and his HR is in the 90's. I increased his Coreg to 6.25 mg BID and added Entresto 24/26 BID. He'll return in 3 weeks for further adjustment in his medications. F/U echo Oct.   Corine Shelter PA-C 09/17/2016 9:48 AM

## 2016-09-17 NOTE — Assessment & Plan Note (Signed)
SCr 1.45 on Cozaar

## 2016-09-17 NOTE — Assessment & Plan Note (Signed)
Pt has a history of CHF- prior work up by Dr Jacinto Halim in 2014 revealed NICM EF-27%, (low risk Myoview) then.  ED visit 08/20/16 with recurrent CHF- off medications. We saw him in the office in f/u (now employed) and he has been re started on medications. We are in the process of up titration of his medication

## 2016-09-17 NOTE — Patient Instructions (Addendum)
Medication Instructions:  STOP Losartan  INCREASE carvedilol (Coreg) to 6.25mg  two times daily  START Entresto 24/26 (1 tablet) 2 times daily.  Samples have been provided  Labwork: NONE  Testing/Procedures: NONE  Follow-Up: Your physician recommends that you schedule a follow-up appointment in: 8/29 at 9AM with Corine Shelter PA   Any Other Special Instructions Will Be Listed Below (If Applicable).     If you need a refill on your cardiac medications before your next appointment, please call your pharmacy.

## 2016-09-17 NOTE — Assessment & Plan Note (Signed)
Still room to adjust medications

## 2016-09-17 NOTE — Assessment & Plan Note (Signed)
EF 20-25% by echo July 2018

## 2016-10-02 ENCOUNTER — Other Ambulatory Visit: Payer: Self-pay | Admitting: Cardiology

## 2016-10-02 MED ORDER — SACUBITRIL-VALSARTAN 24-26 MG PO TABS: 1.0000 | ORAL_TABLET | Freq: Two times a day (BID) | ORAL | 0 refills | Status: DC

## 2016-10-02 NOTE — Telephone Encounter (Signed)
Patient directly notified. Patient voiced understanding and stated he would pick up sample tomorrow.

## 2016-10-02 NOTE — Telephone Encounter (Signed)
Patient calling the office for samples of medication:   1.  What medication and dosage are you requesting samples for? Entresto  2.  Are you currently out of this medication? 1 left

## 2016-10-10 ENCOUNTER — Other Ambulatory Visit: Payer: Self-pay

## 2016-10-11 ENCOUNTER — Telehealth: Payer: Self-pay | Admitting: Cardiology

## 2016-10-11 NOTE — Telephone Encounter (Signed)
Advised patient samples at front for pick up  Entresto #1 Lot [id number] exp 4/20

## 2016-10-11 NOTE — Telephone Encounter (Signed)
Patient calling the office for samples of medication:   1.  What medication and dosage are you requesting samples for? Entresto 25 mg  2.  Are you currently out of this medication? 2 pills left

## 2016-10-17 ENCOUNTER — Ambulatory Visit (INDEPENDENT_AMBULATORY_CARE_PROVIDER_SITE_OTHER): Payer: BLUE CROSS/BLUE SHIELD | Admitting: Cardiology

## 2016-10-17 ENCOUNTER — Encounter: Payer: Self-pay | Admitting: Cardiology

## 2016-10-17 VITALS — BP 150/108 | HR 76 | Ht 70.0 in | Wt 169.0 lb

## 2016-10-17 DIAGNOSIS — N183 Chronic kidney disease, stage 3 unspecified: Secondary | ICD-10-CM

## 2016-10-17 DIAGNOSIS — I428 Other cardiomyopathies: Secondary | ICD-10-CM

## 2016-10-17 DIAGNOSIS — I5022 Chronic systolic (congestive) heart failure: Secondary | ICD-10-CM | POA: Diagnosis not present

## 2016-10-17 DIAGNOSIS — Z79899 Other long term (current) drug therapy: Secondary | ICD-10-CM | POA: Diagnosis not present

## 2016-10-17 DIAGNOSIS — I10 Essential (primary) hypertension: Secondary | ICD-10-CM

## 2016-10-17 LAB — BASIC METABOLIC PANEL
BUN/Creatinine Ratio: 13 (ref 9–20)
BUN: 19 mg/dL (ref 6–24)
CO2: 22 mmol/L (ref 20–29)
Calcium: 9.7 mg/dL (ref 8.7–10.2)
Chloride: 100 mmol/L (ref 96–106)
Creatinine, Ser: 1.42 mg/dL — ABNORMAL HIGH (ref 0.76–1.27)
GFR calc Af Amer: 65 mL/min/{1.73_m2} (ref >59)
GFR calc non Af Amer: 56 mL/min/{1.73_m2} — ABNORMAL LOW (ref >59)
Glucose: 100 mg/dL — ABNORMAL HIGH (ref 65–99)
Potassium: 4.2 mmol/L (ref 3.5–5.2)
Sodium: 141 mmol/L (ref 134–144)

## 2016-10-17 MED ORDER — SACUBITRIL-VALSARTAN 49-51 MG PO TABS: 1.0000 | ORAL_TABLET | Freq: Two times a day (BID) | ORAL | 3 refills | Status: DC

## 2016-10-17 MED ORDER — CARVEDILOL 12.5 MG PO TABS
12.5000 mg | ORAL_TABLET | Freq: Two times a day (BID) | ORAL | 3 refills | Status: DC
Start: 2016-10-17 — End: 2017-01-22

## 2016-10-17 NOTE — Patient Instructions (Signed)
Medication Instructions: INCREASE the carvedilol to 12.5 mg tablet twice daily INCREASE the Entresto to 49-51mg  tablet twice daily..  If you need a refill on your cardiac medications before your next appointment, please call your pharmacy.   Labwork: Your physician recommends that you have a BMP drawn today and then again in 3 months. You do not need to be fasting.   Procedures/Testing: Your physician has requested that you have an echocardiogram in three months. Echocardiography is a painless test that uses sound waves to create images of your heart. It provides your doctor with information about the size and shape of your heart and how well your heart's chambers and valves are working. This procedure takes approximately one hour. There are no restrictions for this procedure. This will take place at [address], [address]    Follow-Up: Your physician wants you to follow-up in: 3 months with Dr. Antoine Poche. You will receive a reminder letter in the mail two months in advance. If you don't receive a letter, please call our office to schedule this follow-up appointment.   Thank you for choosing [facility] at [facility]!!

## 2016-10-17 NOTE — Progress Notes (Signed)
10/17/2016 Norman Jones.   September 28, 1965  [id number]  Primary Physician Danford, Jinny Blossom, NP Primary Cardiologist: Dr Antoine Poche  HPI:  51 y/o AA male with a history of NICM in 2014. He had a negative Myoview then. He did well on medications but subsequently lost his job when the [employer] office closed and he could not afford his medications. He presented to the ED in April 2018, August 06 2016, and finally 08/20/16 with CHF. Dr Antoine Poche saw him in the office 08/23/16. An ARB was added.  An echo done 08/24/16 shows severe LVD with an EF of 20-25%. I saw him in f/u 08/28/16 and added Coreg. A BMP was done as an OP  09/06/16-K+ 4.4 and his SCR has steadily improved- now down to 1.45. I saw him 09/17/16 and added Entresto and increased his Coreg. He is back today for follow up. He says he feels "great". He is having no problems with his medications.    Current Outpatient Prescriptions  Medication Sig Dispense Refill  . aspirin EC 81 MG tablet Take 1 tablet (81 mg total) by mouth daily. 90 tablet 3  . carvedilol (COREG) 12.5 MG tablet Take 1 tablet (12.5 mg total) by mouth 2 (two) times daily. 180 tablet 3  . furosemide (LASIX) 40 MG tablet Take 1 tablet (40 mg total) by mouth daily. 30 tablet 5  . sacubitril-valsartan (ENTRESTO) 49-51 MG Take 1 tablet by mouth 2 (two) times daily. 180 tablet 3   No current facility-administered medications for this visit.     Allergies  Allergen Reactions  . Penicillins Swelling    Swelling in groin  Has patient had a PCN reaction causing immediate rash, facial/tongue/throat swelling, SOB or lightheadedness with hypotension: YES Has patient had a PCN reaction causing severe rash involving mucus membranes or skin necrosis: NO Has patient had a PCN reaction that required hospitalization: UNKNOWN Has patient had a PCN reaction occurring within the last 10 years: NO If all of the above answers are "NO", then may proceed with Cephalosporin use.     Past Medical  History:  Diagnosis Date  . Asthma   . CHF (congestive heart failure) (HCC) 09/29/2012  . Hypertension   . Seasonal allergies     Social History   Social History  . Marital status: Single    Spouse name: N/A  . Number of children: N/A  . Years of education: N/A   Occupational History  . IT Support    Social History Main Topics  . Smoking status: Current Every Day Smoker    Packs/day: 1.00    Types: Cigarettes  . Smokeless tobacco: Never Used  . Alcohol use Yes     Comment: socially  . Drug use: Yes    Types: Marijuana  . Sexual activity: Yes    Birth control/ protection: None   Other Topics Concern  . Not on file   Social History Narrative   Marital status: divorced      Children: none      Employment: Scientist, product/process development; works from home      Tobacco: 1 ppd      Alcohol:  3 ounces on weekends      Drugs:  None        Family History  Problem Relation Age of Onset  . Cancer Mother        Breast cancer  . Diabetes Maternal Grandmother      Review of Systems: General: negative for chills, fever,  night sweats or weight changes.  Cardiovascular: negative for chest pain, dyspnea on exertion, edema, orthopnea, palpitations, paroxysmal nocturnal dyspnea or shortness of breath Dermatological: negative for rash Respiratory: negative for cough or wheezing Urologic: negative for hematuria Abdominal: negative for nausea, vomiting, diarrhea, bright red blood per rectum, melena, or hematemesis Neurologic: negative for visual changes, syncope, or dizziness All other systems reviewed and are otherwise negative except as noted above.    Blood pressure (!) 150/108, pulse 76, height 5\' 10"  (1.778 m), weight 169 lb (76.7 kg).  General appearance: alert, cooperative and no distress Neck: no carotid bruit and no JVD Lungs: clear to auscultation bilaterally Heart: regular rate and rhythm Extremities: extremities normal, atraumatic, no cyanosis or edema Skin: Skin color,  texture, turgor normal. No rashes or lesions Neurologic: Grossly normal   ASSESSMENT AND PLAN:   NICM (nonischemic cardiomyopathy) (HCC) EF 20-25% by echo July 2018 Tolerating medications, will increase doses today  Hypertension Still room for improvment  CRI (chronic renal insufficiency), stage 3 (moderate) Last SCr improved-1.45  Chronic systolic CHF (congestive heart failure) (HCC) Pt has a history of CHF- prior work up by Dr Jacinto Halim in 2014 revealed NICM EF-27%, (low risk Myoview) then.  ED visit 08/20/16 with recurrent CHF- off medications. Currently doing well   PLAN  I increased his Entresto 49/51 and his Coreg to 12.5 mg BID. He'll have a BMP today and an echo in 3 months and f/u with Dr Antoine Poche then. If his EF is still < 30% we'll need to consider an ICD  Corine Shelter PA-C 10/17/2016 8:29 AM

## 2016-10-17 NOTE — Assessment & Plan Note (Signed)
EF 20-25% by echo July 2018 Tolerating medications, will increase doses today

## 2016-10-17 NOTE — Assessment & Plan Note (Signed)
Last SCr improved-1.45

## 2016-10-17 NOTE — Assessment & Plan Note (Signed)
Pt has a history of CHF- prior work up by Dr Jacinto Halim in 2014 revealed NICM EF-27%, (low risk Myoview) then.  ED visit 08/20/16 with recurrent CHF- off medications. Currently doing well

## 2016-10-17 NOTE — Assessment & Plan Note (Signed)
Still room for improvment

## 2016-11-14 NOTE — Progress Notes (Signed)
Subjective:    Patient ID: Norman Dunk., male    DOB: 18-Oct-1965, 51 y.o.   MRN: [id number]  HPI:  Norman Jones is here for regular f/u: HTN, CHF, impaired glucose tolerance, and tobacco use.  His last cards OV was 09/17/2016 and provider increased Coreg 6.25mg  to BID and added Entresto 24/26 BID. He reports medication compliance and denies SE.  He is able to sleep supine in bed all night and reports dramatic reduction in daytime fatigue. He reports 1 nightly "shot of liquor" which he has been doing for years. He walks "all day everyday" when working M-F, and is interested in beginning a swimming regime at local [employer].   08/24/2016 ECHO results: "- Left ventricle: The cavity size was severely dilated. Wall   thickness was normal. Systolic function was severely reduced. The   estimated ejection fraction was in the range of 20% to 25%.   Diffuse hypokinesis. - Mitral valve: There was moderate to severe regurgitation. - Left atrium: The atrium was mildly dilated. - Right ventricle: The cavity size was mildly dilated. Systolic   function was severely reduced. - Right atrium: The atrium was mildly dilated. - Tricuspid valve: There was moderate regurgitation. - Pulmonary arteries: Systolic pressure was moderately to severely   increased. PA peak pressure: 67 mm Hg (S)." He has f/ui with cards in Dec 2018.  He continues to smoke a pack every 2 days, since age 72.  He declined smoking cessation today.  He drinks minimal water and consumed several high sugar drinks (cheewine and juices) and he has been trying to reduce saturated fat and salt from his diet.  Patient Care Team    Relationship Specialty Notifications Start End  Norman Fusi, NP PCP - General Family Medicine  08/23/16     Patient Active Problem List   Diagnosis Date Noted  . NICM (nonischemic cardiomyopathy) (HCC) 09/17/2016  . CRI (chronic renal insufficiency), stage 3 (moderate) (HCC) 08/23/2016  . Prediabetes 08/23/2016  .  Healthcare maintenance 08/23/2016  . Dry skin 08/23/2016  . Hypertension 09/29/2012  . Chronic systolic CHF (congestive heart failure) (HCC) 09/29/2012     Past Medical History:  Diagnosis Date  . Asthma   . CHF (congestive heart failure) (HCC) 09/29/2012  . Hypertension   . Seasonal allergies      History reviewed. No pertinent surgical history.   Family History  Problem Relation Age of Onset  . Cancer Mother        Breast cancer  . Diabetes Maternal Grandmother      History  Drug Use  . Types: Marijuana     History  Alcohol Use  . Yes    Comment: socially     History  Smoking Status  . Current Every Day Smoker  . Packs/day: 1.00  . Types: Cigarettes  Smokeless Tobacco  . Never Used     Outpatient Encounter Prescriptions as of 11/26/2016  Medication Sig  . aspirin EC 81 MG tablet Take 1 tablet (81 mg total) by mouth daily.  . carvedilol (COREG) 12.5 MG tablet Take 1 tablet (12.5 mg total) by mouth 2 (two) times daily.  . furosemide (LASIX) 40 MG tablet Take 1 tablet (40 mg total) by mouth daily.  . sacubitril-valsartan (ENTRESTO) 49-51 MG Take 1 tablet by mouth 2 (two) times daily.   No facility-administered encounter medications on file as of 11/26/2016.     Allergies: Penicillins  Body mass index is 25.08 kg/m.  Blood pressure Marland Kitchen)  150/77, pulse 85, height 5\' 10"  (1.778 m), weight 174 lb 12.8 oz (79.3 kg).      Review of Systems  Constitutional: Negative for activity change, appetite change, chills, diaphoresis, fatigue, fever and unexpected weight change.  Eyes: Negative for visual disturbance.  Respiratory: Negative for cough, chest tightness, shortness of breath, wheezing and stridor.   Cardiovascular: Negative for chest pain, palpitations and leg swelling.  Gastrointestinal: Negative for abdominal distention, abdominal pain, blood in stool, constipation, diarrhea, nausea and vomiting.  Endocrine: Negative for cold intolerance, heat  intolerance, polydipsia, polyphagia and polyuria.  Genitourinary: Negative for difficulty urinating, flank pain and hematuria.  Musculoskeletal: Negative for arthralgias, back pain, gait problem, joint swelling, myalgias, neck pain and neck stiffness.  Skin: Negative for color change, pallor, rash and wound.  Allergic/Immunologic: Negative for immunocompromised state.  Neurological: Negative for dizziness and headaches.  Hematological: Does not bruise/bleed easily.  Psychiatric/Behavioral: Negative for dysphoric mood, self-injury, sleep disturbance and suicidal ideas. The patient is not nervous/anxious and is not hyperactive.        Objective:   Physical Exam  Constitutional: He is oriented to person, place, and time. He appears well-developed and well-nourished. No distress.  HENT:  Head: Normocephalic and atraumatic.  Right Ear: External ear normal.  Left Ear: External ear normal.  Eyes: Pupils are equal, round, and reactive to light. Conjunctivae are normal.  Neck: Normal range of motion. Neck supple.  Cardiovascular: Regular rhythm and intact distal pulses.   Murmur heard.  Systolic murmur is present with a grade of 3/6  Pulmonary/Chest: Effort normal and breath sounds normal. No respiratory distress. He has no wheezes. He has no rales. He exhibits no tenderness.  Abdominal: Soft. Bowel sounds are normal. He exhibits no distension and no mass. There is no tenderness. There is no rigidity, no rebound, no guarding, no CVA tenderness, no tenderness at McBurney's point and negative Murphy's sign.  Musculoskeletal: Normal range of motion.  Ambulates with limp  Lymphadenopathy:    He has no cervical adenopathy.  Neurological: He is alert and oriented to person, place, and time. Coordination normal.  Skin: Skin is warm, dry and intact. No rash noted. He is not diaphoretic. No erythema. No pallor.  No significant edema in lower extremities noted. Lower extremities excessively dry skin, no  open tissue noted.   Psychiatric: He has a normal mood and affect. His behavior is normal. Judgment and thought content normal.          Assessment & Plan:   1. Need for Tdap vaccination   2. Need for influenza vaccination   3. Encounter for hepatitis C screening test for low risk patient   4. Screening for HIV (human immunodeficiency virus)   5. Screening for colon cancer   6. Prediabetes   7. Essential hypertension   8. Chronic systolic CHF (congestive heart failure) (HCC)     Prediabetes A1c 6.0 on 08/23/16 Discussed that this is pre-diabetes and reduction in sugar/CHO and increase in regular exercise is imperative to prevent full blown T2D. + family hx of T2D- his brother.   Hypertension BP 150/77, HR 85 Still above goal- continue to reduce Na++ in diet and increase regular exercise. Stressed eliminating all tobacco use, declined smoking cessation today.  Chronic systolic CHF (congestive heart failure) (HCC) Followed by cards. Reduction in fatigue and can sleep supine in bed now.    FOLLOW-UP:  Return in about 4 months (around 03/29/2017) for CPE. 1. Need for Tdap vaccination   2.  Need for influenza vaccination   3. Encounter for hepatitis C screening test for low risk patient   4. Screening for HIV (human immunodeficiency virus)   5. Screening for colon cancer   6. Prediabetes   7. Essential hypertension   8. Chronic systolic CHF (congestive heart failure) (HCC)     Prediabetes A1c 6.0 on 08/23/16 Discussed that this is pre-diabetes and reduction in sugar/CHO and increase in regular exercise is imperative to prevent full blown T2D. + family hx of T2D- his brother.   Hypertension BP 150/77, HR 85 Still above goal- continue to reduce Na++ in diet and increase regular exercise. Stressed eliminating all tobacco use, declined smoking cessation today.  Chronic systolic CHF (congestive heart failure) (HCC) Followed by cards. Reduction in fatigue and can sleep  supine in bed now.    FOLLOW-UP:  Return in about 4 months (around 03/29/2017) for CPE.

## 2016-11-26 ENCOUNTER — Ambulatory Visit (INDEPENDENT_AMBULATORY_CARE_PROVIDER_SITE_OTHER): Payer: BLUE CROSS/BLUE SHIELD | Admitting: Adult Health

## 2016-11-26 ENCOUNTER — Encounter: Payer: Self-pay | Admitting: Adult Health

## 2016-11-26 VITALS — BP 150/77 | HR 85 | Ht 70.0 in | Wt 174.8 lb

## 2016-11-26 DIAGNOSIS — Z1159 Encounter for screening for other viral diseases: Secondary | ICD-10-CM | POA: Diagnosis not present

## 2016-11-26 DIAGNOSIS — I10 Essential (primary) hypertension: Secondary | ICD-10-CM

## 2016-11-26 DIAGNOSIS — I5022 Chronic systolic (congestive) heart failure: Secondary | ICD-10-CM | POA: Diagnosis not present

## 2016-11-26 DIAGNOSIS — Z1211 Encounter for screening for malignant neoplasm of colon: Secondary | ICD-10-CM

## 2016-11-26 DIAGNOSIS — R7303 Prediabetes: Secondary | ICD-10-CM

## 2016-11-26 DIAGNOSIS — Z114 Encounter for screening for human immunodeficiency virus [HIV]: Secondary | ICD-10-CM

## 2016-11-26 DIAGNOSIS — Z23 Encounter for immunization: Secondary | ICD-10-CM | POA: Diagnosis not present

## 2016-11-26 NOTE — Assessment & Plan Note (Signed)
BP 150/77, HR 85 Still above goal- continue to reduce Na++ in diet and increase regular exercise. Stressed eliminating all tobacco use, declined smoking cessation today.

## 2016-11-26 NOTE — Patient Instructions (Addendum)
Hypertension Hypertension, commonly called high blood pressure, is when the force of blood pumping through the arteries is too strong. The arteries are the blood vessels that carry blood from the heart throughout the body. Hypertension forces the heart to work harder to pump blood and may cause arteries to become narrow or stiff. Having untreated or uncontrolled hypertension can cause heart attacks, strokes, kidney disease, and other problems. A blood pressure reading consists of a higher number over a lower number. Ideally, your blood pressure should be below 120/80. The first ("top") number is called the systolic pressure. It is a measure of the pressure in your arteries as your heart beats. The second ("bottom") number is called the diastolic pressure. It is a measure of the pressure in your arteries as the heart relaxes. What are the causes? The cause of this condition is not known. What increases the risk? Some risk factors for high blood pressure are under your control. Others are not. Factors you can change  Smoking.  Having type 2 diabetes mellitus, high cholesterol, or both.  Not getting enough exercise or physical activity.  Being overweight.  Having too much fat, sugar, calories, or salt (sodium) in your diet.  Drinking too much alcohol. Factors that are difficult or impossible to change  Having chronic kidney disease.  Having a family history of high blood pressure.  Age. Risk increases with age.  Race. You may be at higher risk if you are African-American.  Gender. Men are at higher risk than women before age 45. After age 65, women are at higher risk than men.  Having obstructive sleep apnea.  Stress. What are the signs or symptoms? Extremely high blood pressure (hypertensive crisis) may cause:  Headache.  Anxiety.  Shortness of breath.  Nosebleed.  Nausea and vomiting.  Severe chest pain.  Jerky movements you cannot control (seizures).  How is this  diagnosed? This condition is diagnosed by measuring your blood pressure while you are seated, with your arm resting on a surface. The cuff of the blood pressure monitor will be placed directly against the skin of your upper arm at the level of your heart. It should be measured at least twice using the same arm. Certain conditions can cause a difference in blood pressure between your right and left arms. Certain factors can cause blood pressure readings to be lower or higher than normal (elevated) for a short period of time:  When your blood pressure is higher when you are in a health care provider's office than when you are at home, this is called white coat hypertension. Most people with this condition do not need medicines.  When your blood pressure is higher at home than when you are in a health care provider's office, this is called masked hypertension. Most people with this condition may need medicines to control blood pressure.  If you have a high blood pressure reading during one visit or you have normal blood pressure with other risk factors:  You may be asked to return on a different day to have your blood pressure checked again.  You may be asked to monitor your blood pressure at home for 1 week or longer.  If you are diagnosed with hypertension, you may have other blood or imaging tests to help your health care provider understand your overall risk for other conditions. How is this treated? This condition is treated by making healthy lifestyle changes, such as eating healthy foods, exercising more, and reducing your alcohol intake. Your   health care provider may prescribe medicine if lifestyle changes are not enough to get your blood pressure under control, and if:  Your systolic blood pressure is above 130.  Your diastolic blood pressure is above 80.  Your personal target blood pressure may vary depending on your medical conditions, your age, and other factors. Follow these  instructions at home: Eating and drinking  Eat a diet that is high in fiber and potassium, and low in sodium, added sugar, and fat. An example eating plan is called the DASH (Dietary Approaches to Stop Hypertension) diet. To eat this way: ? Eat plenty of fresh fruits and vegetables. Try to fill half of your plate at each meal with fruits and vegetables. ? Eat whole grains, such as whole wheat pasta, brown rice, or whole grain bread. Fill about one quarter of your plate with whole grains. ? Eat or drink low-fat dairy products, such as skim milk or low-fat yogurt. ? Avoid fatty cuts of meat, processed or cured meats, and poultry with skin. Fill about one quarter of your plate with lean proteins, such as fish, chicken without skin, beans, eggs, and tofu. ? Avoid premade and processed foods. These tend to be higher in sodium, added sugar, and fat.  Reduce your daily sodium intake. Most people with hypertension should eat less than 1,500 mg of sodium a day.  Limit alcohol intake to no more than 1 drink a day for nonpregnant women and 2 drinks a day for men. One drink equals 12 oz of beer, 5 oz of wine, or 1 oz of hard liquor. Lifestyle  Work with your health care provider to maintain a healthy body weight or to lose weight. Ask what an ideal weight is for you.  Get at least 30 minutes of exercise that causes your heart to beat faster (aerobic exercise) most days of the week. Activities may include walking, swimming, or biking.  Include exercise to strengthen your muscles (resistance exercise), such as pilates or lifting weights, as part of your weekly exercise routine. Try to do these types of exercises for 30 minutes at least 3 days a week.  Do not use any products that contain nicotine or tobacco, such as cigarettes and e-cigarettes. If you need help quitting, ask your health care provider.  Monitor your blood pressure at home as told by your health care provider.  Keep all follow-up visits as  told by your health care provider. This is important. Medicines  Take over-the-counter and prescription medicines only as told by your health care provider. Follow directions carefully. Blood pressure medicines must be taken as prescribed.  Do not skip doses of blood pressure medicine. Doing this puts you at risk for problems and can make the medicine less effective.  Ask your health care provider about side effects or reactions to medicines that you should watch for. Contact a health care provider if:  You think you are having a reaction to a medicine you are taking.  You have headaches that keep coming back (recurring).  You feel dizzy.  You have swelling in your ankles.  You have trouble with your vision. Get help right away if:  You develop a severe headache or confusion.  You have unusual weakness or numbness.  You feel faint.  You have severe pain in your chest or abdomen.  You vomit repeatedly.  You have trouble breathing. Summary  Hypertension is when the force of blood pumping through your arteries is too strong. If this condition is not   controlled, it may put you at risk for serious complications.  Your personal target blood pressure may vary depending on your medical conditions, your age, and other factors. For most people, a normal blood pressure is less than 120/80.  Hypertension is treated with lifestyle changes, medicines, or a combination of both. Lifestyle changes include weight loss, eating a healthy, low-sodium diet, exercising more, and limiting alcohol. This information is not intended to replace advice given to you by your health care provider. Make sure you discuss any questions you have with your health care provider. Document Released: 02/05/2005 Document Revised: 01/04/2016 Document Reviewed: 01/04/2016 Elsevier Interactive Patient Education  2018 [employer].   Tobacco Use Disorder Tobacco use disorder (TUD) is a mental disorder. It is the  long-term use of tobacco in spite of related health problems or difficulty with normal life activities. Tobacco is most commonly smoked as cigarettes and less commonly as cigars or pipes. Smokeless chewing tobacco and snuff are also popular. People with TUD get a feeling of extreme pleasure (euphoria) from using tobacco and have a desire to use it again and again. Repeated use of tobacco can cause problems. The addictive effects of tobacco are due mainly tothe ingredient nicotine. Nicotine also causes a rush of adrenaline (epinephrine) in the body. This leads to increased blood pressure, heart rate, and breathing rate. These changes may cause problems for people with high blood pressure, weak hearts, or lung disease. High doses of nicotine in children and pets can lead to seizures and death. Tobacco contains a number of other unsafe chemicals. These chemicals are especially harmful when inhaled as smoke and can damage almost every organ in the body. Smokers live shorter lives than nonsmokers and are at risk of dying from a number of diseases and cancers. Tobacco smoke can also cause health problems for nonsmokers (due to inhaling secondhand smoke). Smoking is also a fire hazard. TUD usually starts in the late teenage years and is most common in young adults between the ages of 64 and 74 years. People who start smoking earlier in life are more likely to continue smoking as adults. TUD is somewhat more common in men than women. People with TUD are at higher risk for using alcohol and other drugs of abuse. What increases the risk? Risk factors for TUD include:  Having family members with the disorder.  Being around people who use tobacco.  Having an existing mental health issue such as schizophrenia, depression, bipolar disorder, ADHD, or posttraumatic stress disorder (PTSD).  What are the signs or symptoms? People with tobacco use disorder have two or more of the following signs and symptoms within 12  months:  Use of more tobacco over a longer period than intended.  Not able to cut down or control tobacco use.  A lot of time spent obtaining or using tobacco.  Strong desire or urge to use tobacco (craving). Cravings may last for 6 months or longer after quitting.  Use of tobacco even when use leads to major problems at work, school, or home.  Use of tobacco even when use leads to relationship problems.  Giving up or cutting down on important life activities because of tobacco use.  Repeatedly using tobacco in situations where it puts you or others in physical danger, like smoking in bed.  Use of tobacco even when it is known that a physical or mental problem is likely related to tobacco use. ? Physical problems are numerous and may include chronic bronchitis, emphysema, lung and  other cancers, gum disease, high blood pressure, heart disease, and stroke. ? Mental problems caused by tobacco may include difficulty sleeping and anxiety.  Need to use greater amounts of tobacco to get the same effect. This means you have developed a tolerance.  Withdrawal symptoms as a result of stopping or rapidly cutting back use. These symptoms may last a month or more after quitting and include the following: ? Depressed, anxious, or irritable mood. ? Difficulty concentrating. ? Increased appetite. ? Restlessness or trouble sleeping. ? Use of tobacco to avoid withdrawal symptoms.  How is this diagnosed? Tobacco use disorder is diagnosed by your health care provider. A diagnosis may be made by:  Your health care provider asking questions about your tobacco use and any problems it may be causing.  A physical exam.  Lab tests.  You may be referred to a mental health professional or addiction specialist.  The severity of tobacco use disorder depends on the number of signs and symptoms you have:  Mild-Two or three symptoms.  Moderate-Four or five symptoms.  Severe-Six or more  symptoms.  How is this treated? Many people with tobacco use disorder are unable to quit on their own and need help. Treatment options include the following:  Nicotine replacement therapy (NRT). NRT provides nicotine without the other harmful chemicals in tobacco. NRT gradually lowers the dosage of nicotine in the body and reduces withdrawal symptoms. NRT is available in over-the-counter forms (gum, lozenges, and skin patches) as well as prescription forms (mouth inhaler and nasal spray).  Medicines.This may include: ? Antidepressant medicine that may reduce nicotine cravings. ? A medicine that acts on nicotine receptors in the brain to reduce cravings and withdrawal symptoms. It may also block the effects of tobacco in people with TUD who relapse.  Counseling or talk therapy. A form of talk therapy called behavioral therapy is commonly used to treat people with TUD. Behavioral therapy looks at triggers for tobacco use, how to avoid them, and how to cope with cravings. It is most effective in person or by phone but is also available in self-help forms (books and Internet websites).  Support groups. These provide emotional support, advice, and guidance for quitting tobacco.  The most effective treatment for TUD is usually a combination of medicine, talk therapy, and support groups. Follow these instructions at home:  Keep all follow-up visits as directed by your health care provider. This is important.  Take medicines only as directed by your health care provider.  Check with your health care provider before starting new prescription or over-the-counter medicines. Contact a health care provider if:  You are not able to take your medicines as prescribed.  Treatment is not helping your TUD and your symptoms get worse. Get help right away if:  You have serious thoughts about hurting yourself or others.  You have trouble breathing, chest pain, sudden weakness, or sudden numbness in part  of your body. This information is not intended to replace advice given to you by your health care provider. Make sure you discuss any questions you have with your health care provider. Document Released: 10/12/2003 Document Revised: 10/09/2015 Document Reviewed: 04/03/2013 Elsevier Interactive Patient Education  [employer].  Please continue all medications as directed. Increase water intake, strive for at least 40 ounces/day- try to reduce soda/juice intake. Follow Heart Healthy diet Increase regular exercise.  Recommend at least 30 minutes daily, 5 days per week of walking, jogging, biking, swimming, YouTube/Pinterest workout videos. We will call you  when lab results are available. Please continue with cardiologist as directed. Please schedule full physical in 4 months. NICE TO SEE YOU!

## 2016-11-26 NOTE — Assessment & Plan Note (Signed)
A1c 6.0 on 08/23/16 Discussed that this is pre-diabetes and reduction in sugar/CHO and increase in regular exercise is imperative to prevent full blown T2D. + family hx of T2D- his brother.

## 2016-11-26 NOTE — Assessment & Plan Note (Signed)
Followed by cards. Reduction in fatigue and can sleep supine in bed now.

## 2016-11-27 LAB — HEPATITIS C ANTIBODY: Hep C Virus Ab: <0.1 s/co ratio (ref 0.0–0.9)

## 2016-11-27 LAB — HIV ANTIBODY (ROUTINE TESTING W REFLEX): HIV Screen 4th Generation wRfx: NONREACTIVE

## 2016-12-27 ENCOUNTER — Encounter: Payer: Self-pay | Admitting: Adult Health

## 2016-12-29 ENCOUNTER — Other Ambulatory Visit: Payer: Self-pay | Admitting: Cardiology

## 2016-12-31 NOTE — Telephone Encounter (Signed)
Rx(s) sent to pharmacy electronically.  

## 2017-01-17 ENCOUNTER — Other Ambulatory Visit (HOSPITAL_COMMUNITY): Payer: BLUE CROSS/BLUE SHIELD

## 2017-01-20 NOTE — Progress Notes (Signed)
Cardiology Office Note   Date:  01/22/2017   ID:  Norman Dunkavid Banton Jr., DOB [birth year], MRN [id number]  PCP:  Julaine Fusianford, Katy D, NP  Cardiologist:   Rollene RotundaJames Hadrian Yarbrough, MD  Referring:  ED MD  Chief Complaint  Patient presents with  . Cardiomyopathy      History of Present Illness: Norman DunkDavid Ladnier Jr. is a 51 y.o. male who presents for evaluation of acute on chronic systolic HF.   The patient has a history of CHF.  In 2014 his EF was 27% with moderate to severe MR and PA pressure of 67mmHg.  He had an exercise perfusion study with a dense inferior scar.   We have been following him and up titrating his meds over several visits. He has been feeling well.  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  He is not having any new shortness of breath, PND or orthopnea.  He feels very good and is having no palpitations, presyncope or syncope.  He is having no chest pressure, neck or arm discomfort.   Past Medical History:  Diagnosis Date  . Asthma   . CHF (congestive heart failure) (HCC) 09/29/2012  . Hypertension   . Seasonal allergies     History reviewed. No pertinent surgical history.   Current Outpatient Medications  Medication Sig Dispense Refill  . aspirin EC 81 MG tablet Take 1 tablet (81 mg total) by mouth daily. 90 tablet 3  . carvedilol (COREG) 25 MG tablet Take 1 tablet (25 mg total) by mouth 2 (two) times daily. 180 tablet 3  . furosemide (LASIX) 40 MG tablet TAKE 1 TABLET BY MOUTH EVERY DAY 30 tablet 6  . sacubitril-valsartan (ENTRESTO) 49-51 MG Take 1 tablet by mouth 2 (two) times daily. 180 tablet 3   No current facility-administered medications for this visit.     Allergies:   Penicillins    ROS:  Please see the history of present illness.   Otherwise, review of systems are positive for none.   All other systems are reviewed and negative.    PHYSICAL EXAM: VS:  BP (!)  162/98   Pulse 84   Ht 5\' 10"  (1.778 m)   Wt 175 lb 9.6 oz (79.7 kg)   BMI 25.20 kg/m  , BMI Body mass index is 25.2 kg/m.  GENERAL:  Well appearing NECK:  No jugular venous distention, waveform within normal limits, carotid upstroke brisk and symmetric, no bruits, no thyromegaly LUNGS:  Clear to auscultation bilaterally CHEST:  Unremarkable HEART:  PMI not displaced or sustained,S1 and S2 within normal limits, no S3, no S4, no clicks, no rubs, no murmurs ABD:  Flat, positive bowel sounds normal in frequency in pitch, no bruits, no rebound, no guarding, no midline pulsatile mass, no hepatomegaly, no splenomegaly EXT:  2 plus pulses throughout, no edema, no cyanosis no clubbing   EKG:  EKG is note ordered today.    Recent Labs: 08/20/2016: ALT 17; Hemoglobin 12.4; Platelets 324 09/06/2016: BNP 884.5 10/17/2016: BUN 19; Creatinine, Ser 1.42; Potassium 4.2; Sodium 141    Lipid Panel    Component Value Date/Time   CHOL 143 09/29/2012 1101   TRIG 95 09/29/2012 1101   HDL 32 (L) 09/29/2012 1101   CHOLHDL 4.5 09/29/2012 1101   VLDL 19 09/29/2012 1101   LDLCALC 92 09/29/2012 1101  Wt Readings from Last 3 Encounters:  01/22/17 175 lb 9.6 oz (79.7 kg)  11/26/16 174 lb 12.8 oz (79.3 kg)  10/17/16 169 lb (76.7 kg)      Other studies Reviewed: Additional studies/ records that were reviewed today include: None Review of the above records demonstrates:      ASSESSMENT AND PLAN:  ACUTE ON CHRONIC SYSTOLIC HF:    Today I am going to increase his carvedilol 25 mg twice daily.  I like to have him seen back in 1 month probably to titrate the  [facility].   We will likely also be able to add spironolactone plus or minus BiDil.  Ultimately he is going to then get an echocardiogram.  I am going to cancel the echocardiogram that was scheduled for this month.  If his ejection fraction has not improved dramatically I would suggest right and left heart catheterization and consideration of an  ICD.  I am very pleased that his symptoms have progressed really to class I.  HTN:  This is being managed in the context of treating his CHF  CKD III:   I will check a BMET.    MR:   There was moderate to severe MR. This will be evaluated as above.    Current medicines are reviewed at length with the patient today.  The patient does not have concerns regarding medicines.  The following changes have been made:  As above.   Labs/ tests ordered today include:   Orders Placed This Encounter  Procedures  . Basic Metabolic Panel (BMET)     Disposition:   FU with Corine Shelter PAC in 1 month.     Signed, Rollene Rotunda, MD  01/22/2017 2:47 PM    [facility]

## 2017-01-22 ENCOUNTER — Ambulatory Visit (INDEPENDENT_AMBULATORY_CARE_PROVIDER_SITE_OTHER): Payer: BLUE CROSS/BLUE SHIELD | Admitting: Cardiology

## 2017-01-22 ENCOUNTER — Encounter: Payer: Self-pay | Admitting: Cardiology

## 2017-01-22 VITALS — BP 162/98 | HR 84 | Ht 70.0 in | Wt 175.6 lb

## 2017-01-22 DIAGNOSIS — Z79899 Other long term (current) drug therapy: Secondary | ICD-10-CM

## 2017-01-22 DIAGNOSIS — I10 Essential (primary) hypertension: Secondary | ICD-10-CM | POA: Diagnosis not present

## 2017-01-22 DIAGNOSIS — I5022 Chronic systolic (congestive) heart failure: Secondary | ICD-10-CM

## 2017-01-22 DIAGNOSIS — I340 Nonrheumatic mitral (valve) insufficiency: Secondary | ICD-10-CM

## 2017-01-22 MED ORDER — CARVEDILOL 25 MG PO TABS: 25.0000 mg | ORAL_TABLET | Freq: Two times a day (BID) | ORAL | 3 refills | Status: DC

## 2017-01-22 NOTE — Patient Instructions (Signed)
Medication Instructions:  INCREASE- Carvedilol 25 mg twice a day  If you need a refill on your cardiac medications before your next appointment, please call your pharmacy.  Labwork: BMP Today   Testing/Procedures: None Ordered  Follow-Up: Your physician wants you to follow-up in: 1 Month with [employer].    Thank you for choosing [facility] at [facility]!!

## 2017-01-23 LAB — BASIC METABOLIC PANEL
BUN/Creatinine Ratio: 18 (ref 9–20)
BUN: 29 mg/dL — ABNORMAL HIGH (ref 6–24)
CALCIUM: 9.8 mg/dL (ref 8.7–10.2)
CO2: 26 mmol/L (ref 20–29)
CREATININE: 1.6 mg/dL — AB (ref 0.76–1.27)
Chloride: 99 mmol/L (ref 96–106)
GFR calc Af Amer: 56 mL/min/{1.73_m2} — ABNORMAL LOW (ref >59)
GFR, EST NON AFRICAN AMERICAN: 48 mL/min/{1.73_m2} — AB (ref >59)
Glucose: 90 mg/dL (ref 65–99)
Potassium: 4.6 mmol/L (ref 3.5–5.2)
Sodium: 142 mmol/L (ref 134–144)

## 2017-01-25 ENCOUNTER — Other Ambulatory Visit: Payer: Self-pay

## 2017-01-25 MED ORDER — FUROSEMIDE 40 MG PO TABS: 40.0000 mg | ORAL_TABLET | Freq: Every day | ORAL | 6 refills | Status: DC

## 2017-01-27 ENCOUNTER — Other Ambulatory Visit: Payer: Self-pay | Admitting: Cardiology

## 2017-01-28 ENCOUNTER — Other Ambulatory Visit (HOSPITAL_COMMUNITY): Payer: BLUE CROSS/BLUE SHIELD

## 2017-02-22 ENCOUNTER — Other Ambulatory Visit: Payer: Self-pay | Admitting: *Deleted

## 2017-02-22 ENCOUNTER — Encounter (INDEPENDENT_AMBULATORY_CARE_PROVIDER_SITE_OTHER): Payer: Self-pay

## 2017-02-22 ENCOUNTER — Encounter: Payer: Self-pay | Admitting: Cardiology

## 2017-02-22 ENCOUNTER — Ambulatory Visit (INDEPENDENT_AMBULATORY_CARE_PROVIDER_SITE_OTHER): Payer: BLUE CROSS/BLUE SHIELD | Admitting: Cardiology

## 2017-02-22 VITALS — BP 138/90 | HR 92 | Ht 69.0 in | Wt 173.0 lb

## 2017-02-22 DIAGNOSIS — Z79899 Other long term (current) drug therapy: Secondary | ICD-10-CM

## 2017-02-22 DIAGNOSIS — I428 Other cardiomyopathies: Secondary | ICD-10-CM

## 2017-02-22 DIAGNOSIS — R000 Tachycardia, unspecified: Secondary | ICD-10-CM

## 2017-02-22 LAB — TSH: TSH: 1.35 u[IU]/mL (ref 0.450–4.500)

## 2017-02-22 MED ORDER — ISOSORB DINITRATE-HYDRALAZINE 20-37.5 MG PO TABS: 1.0000 | ORAL_TABLET | Freq: Two times a day (BID) | ORAL | 3 refills | Status: DC

## 2017-02-22 MED ORDER — FUROSEMIDE 20 MG PO TABS: 20.0000 mg | ORAL_TABLET | Freq: Every day | ORAL | 3 refills | Status: DC

## 2017-02-22 NOTE — Progress Notes (Signed)
02/22/2017 Norman Jones.   02/17/1966  [id number]  Primary Physician Danford, Jinny Blossom, NP Primary Cardiologist: Dr Antoine Poche  HPI:  52 y/o AA male with a history of NICM in 2014. He had a negative Myoview then. He did well on medications but subsequently lost his job when the [employer] office closed and he could not afford his medications. He presented to the ED in April 2018, August 06 2016, and finally 08/20/16 with CHF. Dr Antoine Poche saw him in the office 08/23/16. An ARB was added. An echo done 08/24/16 showed severe LVD with an EF of 20-25%. I saw him in f/u 08/28/16 and added Coreg. A BMP was done as an OP 09/06/16-K+ 4.4 and his SCR has steadily improved- now down to 1.45. I saw him 09/17/16 and added Entresto and increased his Coreg. Dr Antoine Poche saw him 01/22/17 and increased his Coreg. He is seeing me today in follow up. He continues to feel well. 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 He is following a low salt diet.     Current Outpatient Medications  Medication Sig Dispense Refill  . aspirin EC 81 MG tablet Take 1 tablet (81 mg total) by mouth daily. 90 tablet 3  . carvedilol (COREG) 25 MG tablet Take 1 tablet (25 mg total) by mouth 2 (two) times daily. 180 tablet 3  . furosemide (LASIX) 40 MG tablet Take 1 tablet (40 mg total) by mouth daily. 30 tablet 6  . sacubitril-valsartan (ENTRESTO) 49-51 MG Take 1 tablet by mouth 2 (two) times daily. 180 tablet 3   No current facility-administered medications for this visit.     Allergies  Allergen Reactions  . Penicillins Swelling    Swelling in groin  Has patient had a PCN reaction causing immediate rash, facial/tongue/throat swelling, SOB or lightheadedness with hypotension: YES Has patient had a PCN reaction causing severe rash involving mucus membranes or skin necrosis: NO Has patient had a PCN reaction that required hospitalization: UNKNOWN Has patient had a PCN reaction occurring within  the last 10 years: NO If all of the above answers are "NO", then may proceed with Cephalosporin use.     Past Medical History:  Diagnosis Date  . Asthma   . CHF (congestive heart failure) (HCC) 09/29/2012  . Hypertension   . Seasonal allergies     Social History   Socioeconomic History  . Marital status: Single    Spouse name: Not on file  . Number of children: Not on file  . Years of education: Not on file  . Highest education level: Not on file  Social Needs  . Financial resource strain: Not on file  . Food insecurity - worry: Not on file  . Food insecurity - inability: Not on file  . Transportation needs - medical: Not on file  . Transportation needs - non-medical: Not on file  Occupational History  . Occupation: IT Support  Tobacco Use  . Smoking status: Current Every Day Smoker    Packs/day: 1.00    Types: Cigarettes  . Smokeless tobacco: Never Used  Substance and Sexual Activity  . Alcohol use: Yes    Comment: socially  . Drug use: Yes    Types: Marijuana  . Sexual activity: Yes    Birth control/protection: None  Other Topics Concern  . Not on file  Social History Narrative   Marital status: divorced      Children: none  Employment: Scientist, product/process development; works from home      Tobacco: 1 ppd      Alcohol:  3 ounces on weekends      Drugs:  None     Family History  Problem Relation Age of Onset  . Cancer Mother        Breast cancer  . Diabetes Maternal Grandmother      Review of Systems: General: negative for chills, fever, night sweats or weight changes.  Cardiovascular: negative for chest pain, dyspnea on exertion, edema, orthopnea, palpitations, paroxysmal nocturnal dyspnea or shortness of breath Dermatological: negative for rash Respiratory: negative for cough or wheezing Urologic: negative for hematuria Abdominal: negative for nausea, vomiting, diarrhea, bright red blood per rectum, melena, or hematemesis Neurologic: negative for visual  changes, syncope, or dizziness All other systems reviewed and are otherwise negative except as noted above.    Blood pressure 138/90, pulse 92, height 5\' 9"  (1.753 m), weight 173 lb (78.5 kg).  General appearance: alert, cooperative and no distress Neck: no JVD Lungs: clear to auscultation bilaterally Heart: regular rate and rhythm Extremities: no edema Skin: warm and dry Neurologic: Grossly normal   ASSESSMENT AND PLAN:   NICM (nonischemic cardiomyopathy) (HCC) EF 20-25% by echo July 2018 Tolerating medications, will add Bidil  Hypertension Still room for improvment  CRI (chronic renal insufficiency), stage 3 (moderate) Last SCr up- 1.6 with K+ of 4.6  Chronic systolic CHF (congestive heart failure) (HCC) Pt has a history of CHF- prior work up by Dr Jacinto Halim in 2014 revealed NICM EF-27%, (low risk Myoview) then.  ED visit 08/20/16 with recurrent CHF- off medications. Currently improved on medications.   PLAN  I'm hesitant to increase his Entresto with the bump in his SCr in Dec. I'm also hesitant to add Aldactone with his K+ of 4.6. Today I cut his Lasix back to 20 mg and will add BiDil. I'm also concerned he remains tachycardic despite Coreg 25 mg BID. I'll check a TSH and BMP today. Echo in one month, then f/u with Dr Antoine Poche.   Corine Shelter PA-C 02/22/2017 8:26 AM

## 2017-02-22 NOTE — Patient Instructions (Addendum)
Medication Instructions:  DECREASE furosemide (Lasix) to 20 mg daily  START Bidil (isosorbide-hydralazine) 20-37.5 mg ---take 1 tablet two times daily  Labwork: TODAY (BMET, TSH)  Testing/Procedures: Your physician has requested that you have an echocardiogram in 1 MONTH. Echocardiography is a painless test that uses sound waves to create images of your heart. It provides your doctor with information about the size and shape of your heart and how well your heart's chambers and valves are working. This procedure takes approximately one hour. There are no restrictions for this procedure.  This will be done at our [facility] location:  [address]  Follow-Up: Your physician recommends that you schedule a follow-up appointment in: after echo with Dr. Antoine Poche   Any Other Special Instructions Will Be Listed Below (If Applicable).     If you need a refill on your cardiac medications before your next appointment, please call your pharmacy.

## 2017-02-23 LAB — BASIC METABOLIC PANEL
BUN/Creatinine Ratio: 15 (ref 9–20)
BUN: 27 mg/dL — ABNORMAL HIGH (ref 6–24)
CO2: 20 mmol/L (ref 20–29)
Calcium: 9.4 mg/dL (ref 8.7–10.2)
Chloride: 103 mmol/L (ref 96–106)
Creatinine, Ser: 1.83 mg/dL — ABNORMAL HIGH (ref 0.76–1.27)
GFR calc Af Amer: 48 mL/min/{1.73_m2} — ABNORMAL LOW (ref >59)
GFR calc non Af Amer: 41 mL/min/{1.73_m2} — ABNORMAL LOW (ref >59)
Glucose: 105 mg/dL — ABNORMAL HIGH (ref 65–99)
Potassium: 4.4 mmol/L (ref 3.5–5.2)
Sodium: 144 mmol/L (ref 134–144)

## 2017-02-28 ENCOUNTER — Other Ambulatory Visit: Payer: Self-pay

## 2017-02-28 DIAGNOSIS — Z79899 Other long term (current) drug therapy: Secondary | ICD-10-CM

## 2017-03-01 LAB — BASIC METABOLIC PANEL
BUN / CREAT RATIO: 13 (ref 9–20)
BUN: 42 mg/dL — ABNORMAL HIGH (ref 6–24)
CO2: 26 mmol/L (ref 20–29)
CREATININE: 3.14 mg/dL — AB (ref 0.76–1.27)
Calcium: 9.3 mg/dL (ref 8.7–10.2)
Chloride: 99 mmol/L (ref 96–106)
GFR calc non Af Amer: 21 mL/min/{1.73_m2} — ABNORMAL LOW (ref >59)
GFR, EST AFRICAN AMERICAN: 25 mL/min/{1.73_m2} — AB (ref >59)
GLUCOSE: 103 mg/dL — AB (ref 65–99)
Potassium: 4.2 mmol/L (ref 3.5–5.2)
SODIUM: 140 mmol/L (ref 134–144)

## 2017-03-04 ENCOUNTER — Telehealth: Payer: Self-pay | Admitting: Cardiology

## 2017-03-04 DIAGNOSIS — Z79899 Other long term (current) drug therapy: Secondary | ICD-10-CM

## 2017-03-04 NOTE — Telephone Encounter (Signed)
-----   Message from Rollene Rotunda, MD sent at 03/01/2017  6:04 PM EST ----- I tried to contact this patient to ask him to stop his Entresto and his Lasix and to get his BMET repeated.  I was unable to get him on his cell or work phone.  His cell would not let me leave a message.  His mother did not answer.  I will try again.

## 2017-03-04 NOTE — Telephone Encounter (Signed)
Patient calling, states that he is returning a call.

## 2017-03-04 NOTE — Telephone Encounter (Signed)
Spoke with pt about his blood work advised pt to STOP his Entresto and Lasix and have BMP draw on Wednesday, pt voice understanding and will becoming in on Wednesday.

## 2017-03-06 LAB — BASIC METABOLIC PANEL
BUN / CREAT RATIO: 15 (ref 9–20)
BUN: 26 mg/dL — AB (ref 6–24)
CHLORIDE: 103 mmol/L (ref 96–106)
CO2: 24 mmol/L (ref 20–29)
CREATININE: 1.79 mg/dL — AB (ref 0.76–1.27)
Calcium: 9.5 mg/dL (ref 8.7–10.2)
GFR calc Af Amer: 49 mL/min/{1.73_m2} — ABNORMAL LOW (ref >59)
GFR calc non Af Amer: 42 mL/min/{1.73_m2} — ABNORMAL LOW (ref >59)
Glucose: 96 mg/dL (ref 65–99)
Potassium: 5 mmol/L (ref 3.5–5.2)
Sodium: 141 mmol/L (ref 134–144)

## 2017-03-21 ENCOUNTER — Encounter: Payer: Self-pay | Admitting: Cardiology

## 2017-03-25 ENCOUNTER — Ambulatory Visit (HOSPITAL_COMMUNITY): Payer: BLUE CROSS/BLUE SHIELD | Attending: Cardiovascular Disease

## 2017-03-25 ENCOUNTER — Other Ambulatory Visit: Payer: Self-pay

## 2017-03-25 DIAGNOSIS — J45909 Unspecified asthma, uncomplicated: Secondary | ICD-10-CM | POA: Insufficient documentation

## 2017-03-25 DIAGNOSIS — I428 Other cardiomyopathies: Secondary | ICD-10-CM | POA: Insufficient documentation

## 2017-03-25 DIAGNOSIS — I509 Heart failure, unspecified: Secondary | ICD-10-CM | POA: Insufficient documentation

## 2017-03-25 DIAGNOSIS — I110 Hypertensive heart disease with heart failure: Secondary | ICD-10-CM | POA: Diagnosis not present

## 2017-03-25 DIAGNOSIS — I340 Nonrheumatic mitral (valve) insufficiency: Secondary | ICD-10-CM | POA: Diagnosis not present

## 2017-03-27 NOTE — Progress Notes (Deleted)
Subjective:    Patient ID: Norman Jones., male    DOB: 13-Apr-1965, 52 y.o.   MRN: [id number]  HPI: 11/26/16 OV:  [name] is here for regular f/u: HTN, CHF, impaired glucose tolerance, and tobacco use.  His last cards OV was 09/17/2016 and provider increased Coreg 6.25mg  to BID and added Entresto 24/26 BID. He reports medication compliance and denies SE.  He is able to sleep supine in bed all night and reports dramatic reduction in daytime fatigue. He reports 1 nightly "shot of liquor" which he has been doing for years. He walks "all day everyday" when working M-F, and is interested in beginning a swimming regime at local [employer].   08/24/2016 ECHO results: "- Left ventricle: The cavity size was severely dilated. Wall   thickness was normal. Systolic function was severely reduced. The   estimated ejection fraction was in the range of 20% to 25%.   Diffuse hypokinesis. - Mitral valve: There was moderate to severe regurgitation. - Left atrium: The atrium was mildly dilated. - Right ventricle: The cavity size was mildly dilated. Systolic   function was severely reduced. - Right atrium: The atrium was mildly dilated. - Tricuspid valve: There was moderate regurgitation. - Pulmonary arteries: Systolic pressure was moderately to severely   increased. PA peak pressure: 67 mm Hg (S)." He has f/u with cards in Dec 2018.  He continues to smoke a pack every 2 days, since age 62.  He declined smoking cessation today.  He drinks minimal water and consumed several high sugar drinks (cheewine and juices) and he has been trying to reduce saturated fat and salt from his diet.  04/01/17 OV: [name] is here for CPE. He was seen 1/19 by cards and BMP showed bump in SCr, therefore he was advised pt to STOP his Entresto and Lasix. He was seen 03/29/17 by cards-  He continues to use tobacco-  Healthcare Maintenance: [employer]-   Patient Care Team    Relationship Specialty Notifications Start End  [place]coe,  Jinny Blossom, NP PCP - General Family Medicine  08/23/16   Rollene Rotunda, MD PCP - Cardiology Cardiology Admissions 02/15/17     Patient Active Problem List   Diagnosis Date Noted  . Non-rheumatic mitral regurgitation 01/22/2017  . NICM (nonischemic cardiomyopathy) (HCC) 09/17/2016  . CRI (chronic renal insufficiency), stage 3 (moderate) (HCC) 08/23/2016  . Prediabetes 08/23/2016  . Healthcare maintenance 08/23/2016  . Dry skin 08/23/2016  . Hypertension 09/29/2012  . Chronic systolic CHF (congestive heart failure) (HCC) 09/29/2012     Past Medical History:  Diagnosis Date  . Asthma   . CHF (congestive heart failure) (HCC) 09/29/2012  . Hypertension   . Seasonal allergies      No past surgical history on file.   Family History  Problem Relation Age of Onset  . Cancer Mother        Breast cancer  . Diabetes Maternal Grandmother      Social History   Substance and Sexual Activity  Drug Use Yes  . Types: Marijuana     Social History   Substance and Sexual Activity  Alcohol Use Yes   Comment: socially     Social History   Tobacco Use  Smoking Status Current Every Day Smoker  . Packs/day: 1.00  . Types: Cigarettes  Smokeless Tobacco Never Used     Outpatient Encounter Medications as of 04/01/2017  Medication Sig  . aspirin EC 81 MG tablet Take 1 tablet (81 mg total)  by mouth daily.  . carvedilol (COREG) 25 MG tablet Take 1 tablet (25 mg total) by mouth 2 (two) times daily.  . furosemide (LASIX) 20 MG tablet Take 1 tablet (20 mg total) by mouth daily.  . isosorbide-hydrALAZINE (BIDIL) 20-37.5 MG tablet Take 1 tablet by mouth 2 (two) times daily.  . sacubitril-valsartan (ENTRESTO) 49-51 MG Take 1 tablet by mouth 2 (two) times daily.   No facility-administered encounter medications on file as of 04/01/2017.     Allergies: Penicillins  There is no height or weight on file to calculate BMI.  There were no vitals taken for this visit.      Review of  Systems  Constitutional: Negative for activity change, appetite change, chills, diaphoresis, fatigue, fever and unexpected weight change.  Eyes: Negative for visual disturbance.  Respiratory: Negative for cough, chest tightness, shortness of breath, wheezing and stridor.   Cardiovascular: Negative for chest pain, palpitations and leg swelling.  Gastrointestinal: Negative for abdominal distention, abdominal pain, blood in stool, constipation, diarrhea, nausea and vomiting.  Endocrine: Negative for cold intolerance, heat intolerance, polydipsia, polyphagia and polyuria.  Genitourinary: Negative for difficulty urinating, flank pain and hematuria.  Musculoskeletal: Negative for arthralgias, back pain, gait problem, joint swelling, myalgias, neck pain and neck stiffness.  Skin: Negative for color change, pallor, rash and wound.  Allergic/Immunologic: Negative for immunocompromised state.  Neurological: Negative for dizziness and headaches.  Hematological: Does not bruise/bleed easily.  Psychiatric/Behavioral: Negative for dysphoric mood, self-injury, sleep disturbance and suicidal ideas. The patient is not nervous/anxious and is not hyperactive.        Objective:   Physical Exam  Constitutional: He is oriented to person, place, and time. He appears well-developed and well-nourished. No distress.  HENT:  Head: Normocephalic and atraumatic.  Right Ear: External ear normal.  Left Ear: External ear normal.  Eyes: Conjunctivae are normal. Pupils are equal, round, and reactive to light.  Neck: Normal range of motion. Neck supple.  Cardiovascular: Regular rhythm and intact distal pulses.  Murmur heard.  Systolic murmur is present with a grade of 3/6. Pulmonary/Chest: Effort normal and breath sounds normal. No respiratory distress. He has no wheezes. He has no rales. He exhibits no tenderness.  Abdominal: Soft. Bowel sounds are normal. He exhibits no distension and no mass. There is no tenderness.  There is no rigidity, no rebound, no guarding, no CVA tenderness, no tenderness at McBurney's point and negative Murphy's sign.  Musculoskeletal: Normal range of motion.  Ambulates with limp  Lymphadenopathy:    He has no cervical adenopathy.  Neurological: He is alert and oriented to person, place, and time. Coordination normal.  Skin: Skin is warm, dry and intact. No rash noted. He is not diaphoretic. No erythema. No pallor.  No significant edema in lower extremities noted. Lower extremities excessively dry skin, no open tissue noted.   Psychiatric: He has a normal mood and affect. His behavior is normal. Judgment and thought content normal.          Assessment & Plan:   No diagnosis found.  No problem-specific Assessment & Plan notes found for this encounter.    FOLLOW-UP:  No Follow-up on file. No diagnosis found.  No problem-specific Assessment & Plan notes found for this encounter.    FOLLOW-UP:  No Follow-up on file.

## 2017-03-27 NOTE — Progress Notes (Signed)
Cardiology Office Note   Date:  03/29/2017   ID:  Norman Dunk., DOB 1965-07-02, MRN [id number]  PCP:  Julaine Fusi, NP  Cardiologist:   Rollene Rotunda, MD    Chief Complaint  Patient presents with  . Follow-up      History of Present Illness: Norman Rabine. is a 52 y.o. male who presents for evaluation of acute on chronic systolic HF.   The patient has a history of CHF.  In 2014 his EF was 27% with moderate to severe MR and PA pressure of .  He had an exercise perfusion study with a dense inferior scar.   He has been coming back for med titration.  An Echo done this week demonstrated the EF to be up to 40 - 45%.   Unfortunately his creatinine went up from 1.83 to 3.14 and his Lasix and Entresto was stopped.  He actually feels very well.  He is much better than he was able to walk up the hill at his work and walk a lot during work. The patient denies any new symptoms such as chest discomfort, neck or arm discomfort. There has been no new shortness of breath, PND or orthopnea. There have been no reported palpitations, presyncope or syncope.   Past Medical History:  Diagnosis Date  . Asthma   . CHF (congestive heart failure) (HCC) 09/29/2012  . Hypertension   . Seasonal allergies     History reviewed. No pertinent surgical history.   Current Outpatient Medications  Medication Sig Dispense Refill  . aspirin EC 81 MG tablet Take 1 tablet (81 mg total) by mouth daily. 90 tablet 3  . carvedilol (COREG) 25 MG tablet Take 1 tablet (25 mg total) by mouth 2 (two) times daily. 180 tablet 3  . sacubitril-valsartan (ENTRESTO) 49-51 MG Take 1 tablet by mouth 2 (two) times daily.     No current facility-administered medications for this visit.     Allergies:   Penicillins    ROS:  Please see the history of present illness.   Otherwise, review of systems are positive for none.   All other systems are reviewed and negative.    PHYSICAL EXAM: VS:  BP 140/80   Pulse 74    Ht 5\' 9"  (1.753 m)   Wt 179 lb 12.8 oz (81.6 kg)   BMI 26.55 kg/m  , BMI Body mass index is 26.55 kg/m.  GENERAL:  Well appearing NECK:  No jugular venous distention, waveform within normal limits, carotid upstroke brisk and symmetric, no bruits, no thyromegaly LUNGS:  Clear to auscultation bilaterally CHEST:  Unremarkable HEART:  PMI not displaced or sustained,S1 and S2 within normal limits, no S3, no S4, no clicks, no rubs, no murmurs ABD:  Flat, positive bowel sounds normal in frequency in pitch, no bruits, no rebound, no guarding, no midline pulsatile mass, no hepatomegaly, no splenomegaly EXT:  2 plus pulses throughout, no edema, no cyanosis no clubbing    EKG:  EKG is not ordered today.   Recent Labs: 08/20/2016: ALT 17; Hemoglobin 12.4; Platelets 324 09/06/2016: BNP 884.5 02/22/2017: TSH 1.350 03/06/2017: BUN 26; Creatinine, Ser 1.79; Potassium 5.0; Sodium 141    Lipid Panel    Component Value Date/Time   CHOL 143 09/29/2012 1101   TRIG 95 09/29/2012 1101   HDL 32 (L) 09/29/2012 1101   CHOLHDL 4.5 09/29/2012 1101   VLDL 19 09/29/2012 1101   LDLCALC 92 09/29/2012 1101      Wt  Readings from Last 3 Encounters:  03/29/17 179 lb 12.8 oz (81.6 kg)  02/22/17 173 lb (78.5 kg)  01/22/17 175 lb 9.6 oz (79.7 kg)      Other studies Reviewed: Additional studies/ records that were reviewed today include: Labs Review of the above records demonstrates:      ASSESSMENT AND PLAN:  CHRONIC SYSTOLIC HF:   EF was improved on echo.   At this point he is off the Lasix and Entresto.  I want to see if we can restart the 49/51 Entresto without diuretic.  I will check a basic metabolic profile in 5 days and again 7 days after that.   Today I am going to increase his carvedilol 25 mg twice daily.  I like to have him seen back in 1 month probably to titrate the  [facility].   We will likely also be able to add spironolactone plus or minus BiDil.  Ultimately he is going to then get an  echocardiogram.  I am going to cancel the echocardiogram that was scheduled for this month.  If his ejection fraction has not improved dramatically I would suggest right and left heart catheterization and consideration of an ICD.  I am very pleased that his symptoms have progressed really to class I.  HTN:  This is being managed in the context of treating his CHF  CKD III:   Creat was up improved to 1.79.  I will check labs as above.   MR:   There was mild/mod on last echo.  I will follow this closely.   TOBACCO: We discussed this today and he picked a stop date.   Current medicines are reviewed at length with the patient today.  The patient does not have concerns regarding medicines.  The following changes have been made:   As above  Labs/ tests ordered today include:   Orders Placed This Encounter  Procedures  . Basic Metabolic Panel (BMET)  . Basic Metabolic Panel (BMET)     Disposition:   FU with Corine Shelter PAc.    Signed, Rollene Rotunda, MD  03/29/2017 9:11 AM    [facility]

## 2017-03-29 ENCOUNTER — Encounter: Payer: Self-pay | Admitting: Cardiology

## 2017-03-29 ENCOUNTER — Ambulatory Visit (INDEPENDENT_AMBULATORY_CARE_PROVIDER_SITE_OTHER): Payer: BLUE CROSS/BLUE SHIELD | Admitting: Cardiology

## 2017-03-29 VITALS — BP 140/80 | HR 74 | Ht 69.0 in | Wt 179.8 lb

## 2017-03-29 DIAGNOSIS — N184 Chronic kidney disease, stage 4 (severe): Secondary | ICD-10-CM

## 2017-03-29 DIAGNOSIS — N1832 Chronic kidney disease, stage 3b: Secondary | ICD-10-CM | POA: Insufficient documentation

## 2017-03-29 DIAGNOSIS — N183 Chronic kidney disease, stage 3 unspecified: Secondary | ICD-10-CM | POA: Insufficient documentation

## 2017-03-29 DIAGNOSIS — I10 Essential (primary) hypertension: Secondary | ICD-10-CM

## 2017-03-29 DIAGNOSIS — Z79899 Other long term (current) drug therapy: Secondary | ICD-10-CM

## 2017-03-29 DIAGNOSIS — I5042 Chronic combined systolic (congestive) and diastolic (congestive) heart failure: Secondary | ICD-10-CM

## 2017-03-29 NOTE — Patient Instructions (Signed)
Medication Instructions:  RESTART- Entresto 49/51 mg twice a day  If you need a refill on your cardiac medications before your next appointment, please call your pharmacy.  Labwork: BMP in 5 days and BMP in 12 days HERE IN OUR OFFICE AT [facility]  Take the provided lab slips for you to take with you to the lab for you blood draw.   You will NOT need to fast   You may go to any [facility] lab that is convenient for you however, we do have a lab in our office that is able to assist you. You do NOT need an appointment for our lab. Once in our office lobby there is a podium to the right of the check-in desk where you are to sign-in and ring a doorbell to alert [place] you are here. Lab is open Monday-Friday from 8:00am to 4:00pm; and is closed for lunch from 12:45p-1:45pm   Testing/Procedures: None Ordered   Follow-Up: Your physician wants you to follow-up in: 6 Weeks with [employer].    Thank you for choosing [facility] at [facility]!!

## 2017-04-01 ENCOUNTER — Ambulatory Visit: Payer: BLUE CROSS/BLUE SHIELD | Admitting: Adult Health

## 2017-04-05 LAB — BASIC METABOLIC PANEL
BUN / CREAT RATIO: 14 (ref 9–20)
BUN: 20 mg/dL (ref 6–24)
CO2: 22 mmol/L (ref 20–29)
Calcium: 9 mg/dL (ref 8.7–10.2)
Chloride: 102 mmol/L (ref 96–106)
Creatinine, Ser: 1.45 mg/dL — ABNORMAL HIGH (ref 0.76–1.27)
GFR calc Af Amer: 63 mL/min/{1.73_m2} (ref >59)
GFR calc non Af Amer: 55 mL/min/{1.73_m2} — ABNORMAL LOW (ref >59)
Glucose: 97 mg/dL (ref 65–99)
POTASSIUM: 4.2 mmol/L (ref 3.5–5.2)
SODIUM: 138 mmol/L (ref 134–144)

## 2017-04-23 LAB — BASIC METABOLIC PANEL
BUN/Creatinine Ratio: 10 (ref 9–20)
BUN: 15 mg/dL (ref 6–24)
CO2: 25 mmol/L (ref 20–29)
Calcium: 9.1 mg/dL (ref 8.7–10.2)
Chloride: 103 mmol/L (ref 96–106)
Creatinine, Ser: 1.55 mg/dL — ABNORMAL HIGH (ref 0.76–1.27)
GFR calc Af Amer: 58 mL/min/{1.73_m2} — ABNORMAL LOW (ref >59)
GFR calc non Af Amer: 50 mL/min/{1.73_m2} — ABNORMAL LOW (ref >59)
Glucose: 95 mg/dL (ref 65–99)
Potassium: 4.1 mmol/L (ref 3.5–5.2)
Sodium: 142 mmol/L (ref 134–144)

## 2017-05-17 ENCOUNTER — Ambulatory Visit (INDEPENDENT_AMBULATORY_CARE_PROVIDER_SITE_OTHER): Payer: BLUE CROSS/BLUE SHIELD | Admitting: Cardiology

## 2017-05-17 VITALS — BP 178/102 | HR 71 | Ht 70.0 in | Wt 174.0 lb

## 2017-05-17 DIAGNOSIS — I5042 Chronic combined systolic (congestive) and diastolic (congestive) heart failure: Secondary | ICD-10-CM | POA: Diagnosis not present

## 2017-05-17 DIAGNOSIS — I10 Essential (primary) hypertension: Secondary | ICD-10-CM

## 2017-05-17 DIAGNOSIS — N183 Chronic kidney disease, stage 3 unspecified: Secondary | ICD-10-CM

## 2017-05-17 LAB — BASIC METABOLIC PANEL
BUN/Creatinine Ratio: 10 (ref 9–20)
BUN: 14 mg/dL (ref 6–24)
CO2: 24 mmol/L (ref 20–29)
Calcium: 9.2 mg/dL (ref 8.7–10.2)
Chloride: 103 mmol/L (ref 96–106)
Creatinine, Ser: 1.42 mg/dL — ABNORMAL HIGH (ref 0.76–1.27)
GFR calc Af Amer: 65 mL/min/{1.73_m2} (ref >59)
GFR calc non Af Amer: 56 mL/min/{1.73_m2} — ABNORMAL LOW (ref >59)
Glucose: 81 mg/dL (ref 65–99)
Potassium: 4.4 mmol/L (ref 3.5–5.2)
Sodium: 140 mmol/L (ref 134–144)

## 2017-05-17 MED ORDER — AMLODIPINE BESYLATE 5 MG PO TABS: 5.0000 mg | ORAL_TABLET | Freq: Every day | ORAL | 11 refills | Status: DC

## 2017-05-17 NOTE — Patient Instructions (Signed)
Medication Instructions: START Amlodipine 5 mg daily  If you need a refill on your cardiac medications before your next appointment, please call your pharmacy.   Labwork: Your physician recommends that you have a BMET lab drawn today   Follow-Up: Your physician wants you to follow-up in one month with Corine Shelter, [place].   Special Instructions: Please check your blood pressure three times a week: morning, lunch and dinner time. Keep a log of these pressures and call the office in 2 weeks at [phone].   Thank you for choosing Heartcare at [facility]!!

## 2017-05-17 NOTE — Assessment & Plan Note (Signed)
Pt has a history of CHF- prior work up by Dr Jacinto Halim in 2014 revealed NICM EF-27%, (low risk Myoview) then.  ED visit 08/20/16 with recurrent CHF- off medications. Medications have been resumed and his most recent EF Feb 2019 showed improvement to 45-50%.

## 2017-05-17 NOTE — Progress Notes (Signed)
05/17/2017 Norman Jones.   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 He was diagnosed with a NICM in 2014. He had a negative Myoview then.He did well but subsequently lost his job when the [employer] closedand he could not afford his medications. He presented to the ED in April 2018, August 06 2016, and finally 08/20/16 with CHF. Dr Antoine Poche saw him in the office 08/23/16. An ARB was added. An echo done 08/24/16 showed severe LVD with an EF of 20-25%. I saw him in f/u 08/28/16 and added Coreg. A BMP was done as an OP 09/06/16-K+ 4.4 and his SCR has steadily improved- now down to 1.45.I saw him 09/17/16 and added Entresto and increased his Coreg. Dr Antoine Poche saw him 01/22/17 and increased his Coreg. He was seen 02/22/17 and his HR was up and his B/P was still high. His Entresto was increased. A f/u BMP done 03/01/17 showed his SCR had bumped up to 3.14, Entresto and Lasix were stopped. Dr Antoine Poche saw him in f/u 03/29/17 and the pt was re-started on Entresto 49/51 without a diuretic.Marland Kitchen His Coreg was also increased to 25 mg BID and BiDil added.  His SCr has come back dow to baseline- 1.5 on 04/23/17. In the meantime an echo done 03/25/17 showed his EF had improved to 45-50%  He is in the office today for follow up. He says he feels "great", no DOE, no orthopnea, no edema. In the office his B/P is markedly elevated. He says he has taken his medications this am. He tells me he has always had "white coat HTN". I reviewed each of his medications and he is adament about compliance with medications and a low sodium diet.    Current Outpatient Medications  Medication Sig Dispense Refill  . aspirin EC 81 MG tablet Take 1 tablet (81 mg total) by mouth daily. 90 tablet 3  . carvedilol (COREG) 25 MG tablet Take 1 tablet (25 mg total) by mouth 2 (two) times daily. 180 tablet 3  .  isosorbide-hydrALAZINE (BIDIL) 20-37.5 MG tablet Take 1 tablet by mouth 2 (two) times daily.    . sacubitril-valsartan (ENTRESTO) 49-51 MG Take 1 tablet by mouth 2 (two) times daily.    Marland Kitchen amLODipine (NORVASC) 5 MG tablet Take 1 tablet (5 mg total) by mouth daily. 30 tablet 11   No current facility-administered medications for this visit.     Allergies  Allergen Reactions  . Penicillins Swelling    Swelling in groin  Has patient had a PCN reaction causing immediate rash, facial/tongue/throat swelling, SOB or lightheadedness with hypotension: YES Has patient had a PCN reaction causing severe rash involving mucus membranes or skin necrosis: NO Has patient had a PCN reaction that required hospitalization: UNKNOWN Has patient had a PCN reaction occurring within the last 10 years: NO If all of the above answers are "NO", then may proceed with Cephalosporin use.     Past Medical History:  Diagnosis Date  . Asthma   . CHF (congestive heart failure) (HCC) 09/29/2012  . Hypertension   . Seasonal allergies     Social History   Socioeconomic History  . Marital status: Single    Spouse name: Not on file  . Number of children: Not on file  . Years of education: Not on file  . Highest education level: Not on  file  Occupational History  . Occupation: IT Support  Social Needs  . Financial resource strain: Not on file  . Food insecurity:    Worry: Not on file    Inability: Not on file  . Transportation needs:    Medical: Not on file    Non-medical: Not on file  Tobacco Use  . Smoking status: Current Every Day Smoker    Packs/day: 1.00    Types: Cigarettes  . Smokeless tobacco: Never Used  Substance and Sexual Activity  . Alcohol use: Yes    Comment: socially  . Drug use: Yes    Types: Marijuana  . Sexual activity: Yes    Birth control/protection: None  Lifestyle  . Physical activity:    Days per week: Not on file    Minutes per session: Not on file  . Stress: Not on file    Relationships  . Social connections:    Talks on phone: Not on file    Gets together: Not on file    Attends religious service: Not on file    Active member of club or organization: Not on file    Attends meetings of clubs or organizations: Not on file    Relationship status: Not on file  . Intimate partner violence:    Fear of current or ex partner: Not on file    Emotionally abused: Not on file    Physically abused: Not on file    Forced sexual activity: Not on file  Other Topics Concern  . Not on file  Social History Narrative   Marital status: divorced      Children: none      Employment: Scientist, product/process development; works from home      Tobacco: 1 ppd      Alcohol:  3 ounces on weekends      Drugs:  None     Family History  Problem Relation Age of Onset  . Cancer Mother        Breast cancer  . Diabetes Maternal Grandmother      Review of Systems: General: negative for chills, fever, night sweats or weight changes.  Cardiovascular: negative for chest pain, dyspnea on exertion, edema, orthopnea, palpitations, paroxysmal nocturnal dyspnea or shortness of breath Dermatological: negative for rash Respiratory: negative for cough or wheezing Urologic: negative for hematuria Abdominal: negative for nausea, vomiting, diarrhea, bright red blood per rectum, melena, or hematemesis Neurologic: negative for visual changes, syncope, or dizziness All other systems reviewed and are otherwise negative except as noted above.    Blood pressure (!) 178/102, pulse 71, height 5\' 10"  (1.778 m), weight 174 lb (78.9 kg), SpO2 98 %.  General appearance: alert, cooperative and no distress Neck: no carotid bruit and no JVD Lungs: clear to auscultation bilaterally Heart: regular rate and rhythm Extremities: extremities normal, atraumatic, no cyanosis or edema Pulses: 2+ and symmetric Skin: Skin color, texture, turgor normal. 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  ED visit 08/20/16 with recurrent CHF- off medications. Medications have been resumed and his most recent EF Feb 2019 showed improvement to 45-50%.   CKD (chronic kidney disease) stage 3, GFR 30-59 ml/min (HCC) His most recent SCr was 1.5-on Entresto and off Lasix.  Hypertension Uncontrolled- I added Amlodipine 5 mg  and he will tarck his B/P as an OP and let us know how he is doing. Hesitant to increase the Entresto with his recent AKI.    PLAN  Add Norvasc 5 mg. Check BMP today. He may need further medication titration if his B/P is not better controlled in the next week or two.   Corine Shelter PA-C 05/17/2017 9:26 AM

## 2017-05-17 NOTE — Assessment & Plan Note (Signed)
Uncontrolled- I added Amlodipine 5 mg and he will tarck his B/P as an OP and let us know how he is doing. Hesitant to increase the Entresto with his recent AKI.

## 2017-05-17 NOTE — Assessment & Plan Note (Signed)
His most recent SCr was 1.5-on Entresto and off Lasix.

## 2017-06-13 ENCOUNTER — Encounter: Payer: Self-pay | Admitting: Cardiology

## 2017-06-13 ENCOUNTER — Ambulatory Visit (INDEPENDENT_AMBULATORY_CARE_PROVIDER_SITE_OTHER): Payer: BLUE CROSS/BLUE SHIELD | Admitting: Cardiology

## 2017-06-13 VITALS — BP 142/88 | HR 87 | Ht 70.0 in | Wt 174.0 lb

## 2017-06-13 DIAGNOSIS — I5042 Chronic combined systolic (congestive) and diastolic (congestive) heart failure: Secondary | ICD-10-CM | POA: Diagnosis not present

## 2017-06-13 DIAGNOSIS — I428 Other cardiomyopathies: Secondary | ICD-10-CM

## 2017-06-13 DIAGNOSIS — N183 Chronic kidney disease, stage 3 unspecified: Secondary | ICD-10-CM

## 2017-06-13 DIAGNOSIS — I10 Essential (primary) hypertension: Secondary | ICD-10-CM | POA: Diagnosis not present

## 2017-06-13 NOTE — Patient Instructions (Signed)
Corine Shelter, [place], recommends that you continue on your current medications as directed. Please refer to the Current Medication list given to you today.  Your physician has requested that you regularly monitor your blood pressure at home. Check your blood pressure 3 times a week, at different times of the day. Please use the same machine to check your blood pressure. Keep a record of your blood pressures using the log sheet provided. Report your readings back to [place] if your systolic blood pressure (top number) is consistently higher than 140 mmHg OR diastolic blood pressure (bottom number) is consistently higher than 90 mmHg. You may use our online patient portal 'MyChart' or you can call the office to speak with a nurse.  Franky Macho recommends that you schedule a follow-up appointment in 3-4 months with Dr Antoine Poche.  If you need a refill on your cardiac medications before your next appointment, please call your pharmacy.

## 2017-06-13 NOTE — Progress Notes (Signed)
06/13/2017 Norman Jones.   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 He had a negative Myoview then.He did well but subsequently lost his job when the [employer] closedand he could not afford his medications. He presented to the ED in April 2018, August 06 2016, and finally 08/20/16 with CHF. An echo done 08/24/16 showedsevere LVD with an EF of 20-25%. We have been adjusting his medications as an OP. His SCR had bumped up to 3.14 in Jan while on [place] and a diuretic. The diuretic was stopped and Entresto held for a few weeks then resumed at a lower doe in Feb 2019. His SCr has come back dow to baseline- 1.42 on 05/13/17. In the meantime an echo done 03/25/17 showed his EF had improved to 45-50%. I saw him in the office 05/17/17 and his B/P was elevated. Amlodipine was added and he is seen today for a follow up. As usual he says "I feel great". He has almost completely stopped smoking. He wants to resume swimming for exercise. His B/P today was 142/82 by me.    Current Outpatient Medications  Medication Sig Dispense Refill  . amLODipine (NORVASC) 5 MG tablet Take 1 tablet (5 mg total) by mouth daily. 30 tablet 11  . aspirin EC 81 MG tablet Take 1 tablet (81 mg total) by mouth daily. 90 tablet 3  . isosorbide-hydrALAZINE (BIDIL) 20-37.5 MG tablet Take 1 tablet by mouth 2 (two) times daily.    . sacubitril-valsartan (ENTRESTO) 49-51 MG Take 1 tablet by mouth 2 (two) times daily.    . carvedilol (COREG) 25 MG tablet Take 1 tablet (25 mg total) by mouth 2 (two) times daily. 180 tablet 3   No current facility-administered medications for this visit.     Allergies  Allergen Reactions  . Penicillins Swelling    Swelling in groin  Has patient had a PCN reaction causing immediate rash, facial/tongue/throat swelling,  SOB or lightheadedness with hypotension: YES Has patient had a PCN reaction causing severe rash involving mucus membranes or skin necrosis: NO Has patient had a PCN reaction that required hospitalization: UNKNOWN Has patient had a PCN reaction occurring within the last 10 years: NO If all of the above answers are "NO", then may proceed with Cephalosporin use.     Past Medical History:  Diagnosis Date  . Asthma   . CHF (congestive heart failure) (HCC) 09/29/2012  . Hypertension   . Seasonal allergies     Social History   Socioeconomic History  . Marital status: Single    Spouse name: Not on file  . Number of children: Not on file  . Years of education: Not on file  . Highest education level: Not on file  Occupational History  . Occupation: IT Support  Social Needs  . Financial resource strain: Not on file  . Food insecurity:    Worry: Not on file    Inability: Not on file  . Transportation needs:    Medical: Not on file    Non-medical: Not on file  Tobacco Use  . Smoking status: Current Every Day Smoker    Packs/day: 1.00    Types: Cigarettes  . Smokeless tobacco: Never Used  Substance and Sexual Activity  . Alcohol use: Yes    Comment: socially  .  Drug use: Yes    Types: Marijuana  . Sexual activity: Yes    Birth control/protection: None  Lifestyle  . Physical activity:    Days per week: Not on file    Minutes per session: Not on file  . Stress: Not on file  Relationships  . Social connections:    Talks on phone: Not on file    Gets together: Not on file    Attends religious service: Not on file    Active member of club or organization: Not on file    Attends meetings of clubs or organizations: Not on file    Relationship status: Not on file  . Intimate partner violence:    Fear of current or ex partner: Not on file    Emotionally abused: Not on file    Physically abused: Not on file    Forced sexual activity: Not on file  Other Topics Concern  . Not on  file  Social History Narrative   Marital status: divorced      Children: none      Employment: Scientist, product/process development; works from home      Tobacco: 1 ppd      Alcohol:  3 ounces on weekends      Drugs:  None     Family History  Problem Relation Age of Onset  . Cancer Mother        Breast cancer  . Diabetes Maternal Grandmother      Review of Systems: General: negative for chills, fever, night sweats or weight changes.  Cardiovascular: negative for chest pain, dyspnea on exertion, edema, orthopnea, palpitations, paroxysmal nocturnal dyspnea or shortness of breath Dermatological: negative for rash Respiratory: negative for cough or wheezing Urologic: negative for hematuria Abdominal: negative for nausea, vomiting, diarrhea, bright red blood per rectum, melena, or hematemesis Neurologic: negative for visual changes, syncope, or dizziness All other systems reviewed and are otherwise negative except as noted above.    Blood pressure (!) 142/88, pulse 87, height 5\' 10"  (1.778 m), weight 174 lb (78.9 kg).  General appearance: alert, cooperative and no distress Neck: no carotid bruit and no JVD Lungs: clear to auscultation bilaterally Heart: regular rate and rhythm Extremities: extremities normal, atraumatic, no cyanosis or edema Skin: Skin color, texture, turgor normal. No rashes or lesions Neurologic: Grossly normal  EKG NSR, LVH with repol changes  ASSESSMENT AND PLAN:   Hypertension Under better control on Amlodipine 5 mg. He will continue to track his B/P as an OP and let us know how he is doing. I'm hesitant to increase the Entresto with his recent AKI.   Chronic combined systolic and diastolic CHF (congestive heart failure) (HCC) Pt has a history of CHF- prior work up by Dr Jacinto Halim in 2014 revealed NICM EF-27%, (low risk Myoview) then.  ED visit 08/20/16 with recurrent CHF- off medications. Medications have been resumed and his most recent EF Feb 2019 showed improvement to  45-50%.   CKD (chronic kidney disease) stage 3, GFR 30-59 ml/min (HCC) His most recent SCr was 1.45-on Entresto and off Lasix.   PLAN  Follow up with Dr Antoine Poche in 3 months. He will contact us if his B/P is consistently running high, if so I would increase Norvasc to 10 mg before adding a diuretic or increasing Entresto.   Corine Shelter PA-C 06/13/2017 9:41 AM

## 2017-08-13 ENCOUNTER — Other Ambulatory Visit: Payer: Self-pay | Admitting: Cardiology

## 2017-09-07 NOTE — Progress Notes (Deleted)
Cardiology Office Note   Date:  09/07/2017   ID:  Norman Dunk., DOB 1965-12-31, MRN [id number]  PCP:  Julaine Fusi, NP  Cardiologist:   Rollene Rotunda, MD  Referring:  ED MD  No chief complaint on file.     History of Present Illness: Norman Chuba. is a 52 y.o. male who presents for evaluation of acute on chronic systolic HF.   The patient has a history of CHF.  In 2014 his EF was 27% with moderate to severe MR and PA pressure of .  He had an exercise perfusion study with a dense inferior scar.   We have been following him and up titrating his meds.   ***     ver several visits. He has been feeling well.  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  He is not having any new shortness of breath, PND or orthopnea.  He feels very good and is having no palpitations, presyncope or syncope.  He is having no chest pressure, neck or arm discomfort.   Past Medical History:  Diagnosis Date  . Asthma   . CHF (congestive heart failure) (HCC) 09/29/2012  . Hypertension   . Seasonal allergies     No past surgical history on file.   Current Outpatient Medications  Medication Sig Dispense Refill  . amLODipine (NORVASC) 5 MG tablet TAKE 1 TABLET BY MOUTH EVERY DAY 90 tablet 1  . aspirin EC 81 MG tablet Take 1 tablet (81 mg total) by mouth daily. 90 tablet 3  . carvedilol (COREG) 25 MG tablet Take 1 tablet (25 mg total) by mouth 2 (two) times daily. 180 tablet 3  . isosorbide-hydrALAZINE (BIDIL) 20-37.5 MG tablet Take 1 tablet by mouth 2 (two) times daily.    . sacubitril-valsartan (ENTRESTO) 49-51 MG Take 1 tablet by mouth 2 (two) times daily.     No current facility-administered medications for this visit.     Allergies:   Penicillins    ROS:  Please see the history of present illness.   Otherwise, review of systems are positive for ***.   All other systems are  reviewed and negative.    PHYSICAL EXAM: VS:  There were no vitals taken for this visit. , BMI There is no height or weight on file to calculate BMI.  GENERAL:  Well appearing NECK:  No jugular venous distention, waveform within normal limits, carotid upstroke brisk and symmetric, no bruits, no thyromegaly LUNGS:  Clear to auscultation bilaterally CHEST:  Unremarkable HEART:  PMI not displaced or sustained,S1 and S2 within normal limits, no S3, no S4, no clicks, no rubs, *** murmurs ABD:  Flat, positive bowel sounds normal in frequency in pitch, no bruits, no rebound, no guarding, no midline pulsatile mass, no hepatomegaly, no splenomegaly EXT:  2 plus pulses throughout, no edema, no cyanosis no clubbing    GENERAL:  Well appearing NECK:  No jugular venous distention, waveform within normal limits, carotid upstroke brisk and symmetric, no bruits, no thyromegaly LUNGS:  Clear to auscultation bilaterally CHEST:  Unremarkable HEART:  PMI not displaced or sustained,S1 and S2 within normal limits, no S3, no S4, no clicks, no rubs, no murmurs ABD:  Flat, positive bowel sounds normal in frequency in pitch, no bruits, no rebound, no guarding, no midline pulsatile mass, no hepatomegaly, no splenomegaly EXT:  2 plus pulses throughout, no edema, no cyanosis no clubbing   EKG:  EKG is *** ordered today. ***   Recent Labs: 02/22/2017: TSH 1.350 05/17/2017: BUN 14; Creatinine, Ser 1.42; Potassium 4.4; Sodium 140    Lipid Panel    Component Value Date/Time   CHOL 143 09/29/2012 1101   TRIG 95 09/29/2012 1101   HDL 32 (L) 09/29/2012 1101   CHOLHDL 4.5 09/29/2012 1101   VLDL 19 09/29/2012 1101   LDLCALC 92 09/29/2012 1101      Wt Readings from Last 3 Encounters:  06/13/17 174 lb (78.9 kg)  05/17/17 174 lb (78.9 kg)  03/29/17 179 lb 12.8 oz (81.6 kg)      Other studies Reviewed: Additional studies/ records that were reviewed today include: *** Review of the above records  demonstrates:   ***   ASSESSMENT AND PLAN:  ACUTE ON CHRONIC SYSTOLIC HF:    EF was improved to 45 - 50% in Feb.  ***  Today I am going to increase his carvedilol 25 mg twice daily.  I like to have him seen back in 1 month probably to titrate the  [facility].   We will likely also be able to add spironolactone plus or minus BiDil.  Ultimately he is going to then get an echocardiogram.  I am going to cancel the echocardiogram that was scheduled for this month.  If his ejection fraction has not improved dramatically I would suggest right and left heart catheterization and consideration of an ICD.  I am very pleased that his symptoms have progressed really to class I.  HTN:  ***  This is being managed in the context of treating his CHF  CKD III:   Creat was stable as above in March.  ***   I will check a BMET.    MR:   There was moderate MR in Feb.  I will follow this with repeat echocardiography.  *** This will be evaluated as above.    Current medicines are reviewed at length with the patient today.  The patient does not have concerns regarding medicines.  The following changes have been made:   ***   Labs/ tests ordered today include:  *** No orders of the defined types were placed in this encounter.    Disposition:   FU with ***   Signed, Rollene Rotunda, MD  09/07/2017 3:08 PM    [facility]

## 2017-09-09 ENCOUNTER — Ambulatory Visit: Payer: BLUE CROSS/BLUE SHIELD | Admitting: Cardiology

## 2017-09-09 DIAGNOSIS — R0989 Other specified symptoms and signs involving the circulatory and respiratory systems: Secondary | ICD-10-CM

## 2017-09-10 ENCOUNTER — Encounter: Payer: Self-pay | Admitting: *Deleted

## 2017-11-28 NOTE — Progress Notes (Signed)
Cardiology Office Note   Date:  11/29/2017   ID:  [name], DOB 01-Oct-1965, MRN [id number]  PCP:  Julaine Fusi, NP  Cardiologist:   Rollene Rotunda, MD    Chief Complaint  Patient presents with  . Cardiomyopathy      History of Present Illness: Norman Slatten. is a 52 y.o. male who presents for evaluation of acute on chronic systolic HF.   The patient has a history of CHF.  In 2014 his EF was 27% with moderat  Follow up echo demonstrated the EF to be up to 40 - 45%.   Unfortunately his creatinine went up from 1.83 to 3.14 and his Lasix and Entresto was stopped.  We restarted the [facility] but he was reluctant to have med titration secondary to his elevated creat.   He did have Norvasc added because of HTN.        Since I last saw him he has done well.  The patient denies any new symptoms such as chest discomfort, neck or arm discomfort. There has been no new shortness of breath, PND or orthopnea. There have been no reported palpitations, presyncope or syncope.  He does a lot of walking at work.  He is the only IT guy for 5 building's.  He denies any cardiovascular symptoms.  His blood pressure however is not well controlled.   Past Medical History:  Diagnosis Date  . Asthma   . CHF (congestive heart failure) (HCC) 09/29/2012  . Hypertension   . Seasonal allergies     History reviewed. No pertinent surgical history.   Current Outpatient Medications  Medication Sig Dispense Refill  . amLODipine (NORVASC) 5 MG tablet Take 1.5 tablets (7.5 mg total) by mouth daily. 135 tablet 3  . aspirin EC 81 MG tablet Take 1 tablet (81 mg total) by mouth daily. 90 tablet 3  . carvedilol (COREG) 25 MG tablet Take 1 tablet (25 mg total) by mouth 2 (two) times daily. 180 tablet 3  . isosorbide-hydrALAZINE (BIDIL) 20-37.5 MG tablet Take 1 tablet by mouth 2 (two) times daily.    . sacubitril-valsartan (ENTRESTO) 49-51 MG Take 1 tablet by mouth 2 (two) times daily.     No current  facility-administered medications for this visit.     Allergies:   Penicillins    ROS:  Please see the history of present illness.   Otherwise, review of systems are positive for none.   All other systems are reviewed and negative.    PHYSICAL EXAM: VS:  BP (!) 164/86   Pulse 73   Ht 5\' 10"  (1.778 m)   Wt 179 lb (81.2 kg)   SpO2 98%   BMI 25.68 kg/m  , BMI Body mass index is 25.68 kg/m.  GENERAL:  Well appearing NECK:  No jugular venous distention, waveform within normal limits, carotid upstroke brisk and symmetric, no bruits, no thyromegaly LUNGS:  Clear to auscultation bilaterally CHEST:  Unremarkable HEART:  PMI not displaced or sustained,S1 and S2 within normal limits, no S3, no S4, no clicks, no rubs, no murmurs ABD:  Flat, positive bowel sounds normal in frequency in pitch, no bruits, no rebound, no guarding, no midline pulsatile mass, no hepatomegaly, no splenomegaly EXT:  2 plus pulses throughout, no edema, no cyanosis no clubbing   EKG:  EKG is not ordered today.    Recent Labs: 02/22/2017: TSH 1.350 05/17/2017: BUN 14; Creatinine, Ser 1.42; Potassium 4.4; Sodium 140    Lipid Panel  Component Value Date/Time   CHOL 143 09/29/2012 1101   TRIG 95 09/29/2012 1101   HDL 32 (L) 09/29/2012 1101   CHOLHDL 4.5 09/29/2012 1101   VLDL 19 09/29/2012 1101   LDLCALC 92 09/29/2012 1101      Wt Readings from Last 3 Encounters:  11/29/17 179 lb (81.2 kg)  06/13/17 174 lb (78.9 kg)  05/17/17 174 lb (78.9 kg)      Other studies Reviewed: Additional studies/ records that were reviewed today include: Labs Review of the above records demonstrates:       ASSESSMENT AND PLAN:  CHRONIC SYSTOLIC HF:    His EF improved.  He was reluctant to titrate the [facility] but he will continue on the meds as listed.  I will change his Norvasc however as below. Marland Kitchen  HTN:   His blood pressure is not controlled.  I am going to increase his Norvasc to 7.5 mg daily.  CKD III:   Creat was  up improved to 1.42 this March.  No change in therapy  MR:   This was moderate.  I reviewed this with him.  I will follow-up with an echocardiogram probably in a year from the last one.   TOBACCO:   We talked again about the need to stop smoking.  He says he will quit on Halloween   Current medicines are reviewed at length with the patient today.  The patient does not have concerns regarding medicines.  The following changes have been made:  None  Labs/ tests ordered today include:  None No orders of the defined types were placed in this encounter.    Disposition:   FU with Corine Shelter PAC in 3 months.    Signed, Rollene Rotunda, MD  11/29/2017 4:35 PM    [facility]

## 2017-11-29 ENCOUNTER — Encounter: Payer: Self-pay | Admitting: Cardiology

## 2017-11-29 ENCOUNTER — Ambulatory Visit (INDEPENDENT_AMBULATORY_CARE_PROVIDER_SITE_OTHER): Payer: BLUE CROSS/BLUE SHIELD | Admitting: Cardiology

## 2017-11-29 VITALS — BP 164/86 | HR 73 | Ht 70.0 in | Wt 179.0 lb

## 2017-11-29 DIAGNOSIS — Z720 Tobacco use: Secondary | ICD-10-CM

## 2017-11-29 DIAGNOSIS — I5022 Chronic systolic (congestive) heart failure: Secondary | ICD-10-CM

## 2017-11-29 DIAGNOSIS — N183 Chronic kidney disease, stage 3 unspecified: Secondary | ICD-10-CM

## 2017-11-29 DIAGNOSIS — I340 Nonrheumatic mitral (valve) insufficiency: Secondary | ICD-10-CM

## 2017-11-29 DIAGNOSIS — I10 Essential (primary) hypertension: Secondary | ICD-10-CM

## 2017-11-29 MED ORDER — AMLODIPINE BESYLATE 5 MG PO TABS: 7.5000 mg | ORAL_TABLET | Freq: Every day | ORAL | 3 refills | Status: DC

## 2017-11-29 NOTE — Patient Instructions (Addendum)
Medication Instructions:  INCREASE- Amlodipine 7.5 mg(1 1/2 tablets) daily  If you need a refill on your cardiac medications before your next appointment, please call your pharmacy.  Labwork: None Ordered   If you have labs (blood work) drawn today and your tests are completely normal, you will receive your results only by: Marland Kitchen MyChart Message (if you have MyChart) OR . A paper copy in the mail If you have any lab test that is abnormal or we need to change your treatment, we will call you to review the results.  Testing/Procedures: None Ordered   Follow-Up: . You will need a follow up appointment in 3 Months with Corine Shelter.    At [facility], you and your health needs are our priority.  As part of our continuing mission to provide you with exceptional heart care, we have created designated Provider Care Teams.  These Care Teams include your primary Cardiologist (physician) and Advanced Practice Providers (APPs -  Physician Assistants and Nurse Practitioners) who all work together to provide you with the care you need, when you need it.   Thank you for choosing [facility] at [facility]!!

## 2018-01-05 ENCOUNTER — Other Ambulatory Visit: Payer: Self-pay | Admitting: Cardiology

## 2018-01-31 ENCOUNTER — Other Ambulatory Visit: Payer: Self-pay | Admitting: Cardiology

## 2018-02-06 ENCOUNTER — Other Ambulatory Visit: Payer: Self-pay | Admitting: Cardiology

## 2018-02-17 ENCOUNTER — Ambulatory Visit (INDEPENDENT_AMBULATORY_CARE_PROVIDER_SITE_OTHER): Payer: BLUE CROSS/BLUE SHIELD | Admitting: Cardiology

## 2018-02-17 ENCOUNTER — Encounter (INDEPENDENT_AMBULATORY_CARE_PROVIDER_SITE_OTHER): Payer: Self-pay

## 2018-02-17 ENCOUNTER — Encounter: Payer: Self-pay | Admitting: Cardiology

## 2018-02-17 VITALS — BP 162/102 | HR 82 | Ht 70.0 in | Wt 183.4 lb

## 2018-02-17 DIAGNOSIS — I428 Other cardiomyopathies: Secondary | ICD-10-CM

## 2018-02-17 DIAGNOSIS — I10 Essential (primary) hypertension: Secondary | ICD-10-CM

## 2018-02-17 DIAGNOSIS — N183 Chronic kidney disease, stage 3 unspecified: Secondary | ICD-10-CM

## 2018-02-17 DIAGNOSIS — I5042 Chronic combined systolic (congestive) and diastolic (congestive) heart failure: Secondary | ICD-10-CM

## 2018-02-17 MED ORDER — AMLODIPINE BESYLATE 10 MG PO TABS: 10.0000 mg | ORAL_TABLET | Freq: Every day | ORAL | 1 refills | Status: DC

## 2018-02-17 MED ORDER — ISOSORB DINITRATE-HYDRALAZINE 20-37.5 MG PO TABS: 1.0000 | ORAL_TABLET | Freq: Three times a day (TID) | ORAL | 1 refills | Status: DC

## 2018-02-17 NOTE — Patient Instructions (Signed)
Medication Instructions:  Increase BIDIL to 1 tablet three times daily.  Increase Amlodipine to 10 mg daily.  If you need a refill on your cardiac medications before your next appointment, please call your pharmacy.   Lab work: Your physician recommends that you return for lab work anytime before next visit with Dr. Antoine Poche.  If you have labs (blood work) drawn today and your tests are completely normal, you will receive your results only by: Marland Kitchen MyChart Message (if you have MyChart) OR . A paper copy in the mail If you have any lab test that is abnormal or we need to change your treatment, we will call you to review the results.   Follow-Up: At [facility], you and your health needs are our priority.  As part of our continuing mission to provide you with exceptional heart care, we have created designated Provider Care Teams.  These Care Teams include your primary Cardiologist (physician) and Advanced Practice Providers (APPs -  Physician Assistants and Nurse Practitioners) who all work together to provide you with the care you need, when you need it. You will need a follow up appointment in 3 months (March) with Dr. Antoine Poche or one of the following Advanced Practice Providers on your designated Care Team:   Theodore Demark, PA-C . Joni Reining, DNP, ANP  Any Other Special Instructions Will Be Listed Below (If Applicable). None

## 2018-02-17 NOTE — Progress Notes (Signed)
02/17/2018 Norman Jones.   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 He was diagnosed with aNICM in 2014.He had a negative Myoview then.He did well but subsequently lost his job when the [employer] closedand he could not afford his medications. He presented to the ED 08/20/16 with CHF. An echo done 08/24/16 showedsevere LVD with an EF of 20-25%. His SCR had bumped up to 3.14 in Jan 2019 while on [facility] and a diuretic. The diuretic was stopped and Entresto held for a few weeks then resumed at a lower doe in Feb 2019. His SCr has come back dow to baseline- 1.42 on 05/13/17.   Ann echo done 03/25/17 showed his EF had improved to 45-50%.  I saw him in the office 05/17/17 and his B/P was elevated. Amlodipine was added.  This was increased to 7.5 mg daily in Oct 2019.  He is in the office today for follow up.  He says he feels :great".  He is taking his medications as prescribed and has taken his AM medications before this office visit.   He tells me his B/P at home runs 140-160 systolic and 80-100 diastolic.    Current Outpatient Medications  Medication Sig Dispense Refill  . amLODipine (NORVASC) 5 MG tablet Take 1.5 tablets (7.5 mg total) by mouth daily. 135 tablet 3  . aspirin EC 81 MG tablet Take 1 tablet (81 mg total) by mouth daily. 90 tablet 3  . carvedilol (COREG) 25 MG tablet TAKE 1 TABLET BY MOUTH TWICE A DAY 180 tablet 3  . ENTRESTO 49-51 MG TAKE 1 TABLET BY MOUTH TWICE A DAY 180 tablet 3  . isosorbide-hydrALAZINE (BIDIL) 20-37.5 MG tablet Take 1 tablet by mouth 2 (two) times daily.    . sacubitril-valsartan (ENTRESTO) 49-51 MG Take 1 tablet by mouth 2 (two) times daily.     No current facility-administered medications for this visit.     Allergies  Allergen Reactions  . Penicillins Swelling    Swelling in groin  Has patient had  a PCN reaction causing immediate rash, facial/tongue/throat swelling, SOB or lightheadedness with hypotension: YES Has patient had a PCN reaction causing severe rash involving mucus membranes or skin necrosis: NO Has patient had a PCN reaction that required hospitalization: UNKNOWN Has patient had a PCN reaction occurring within the last 10 years: NO If all of the above answers are "NO", then may proceed with Cephalosporin use.     Past Medical History:  Diagnosis Date  . Asthma   . CHF (congestive heart failure) (HCC) 09/29/2012  . Hypertension   . Seasonal allergies     Social History   Socioeconomic History  . Marital status: Single    Spouse name: Not on file  . Number of children: Not on file  . Years of education: Not on file  . Highest education level: Not on file  Occupational History  . Occupation: IT Support  Social Needs  . Financial resource strain: Not on file  . Food insecurity:    Worry: Not on file    Inability: Not on file  . Transportation needs:    Medical: Not on file    Non-medical: Not on file  Tobacco Use  . Smoking status: Current Every Day Smoker    Packs/day: 1.00    Types: Cigarettes  . Smokeless tobacco: Never Used  Substance and Sexual Activity  . Alcohol use: Yes    Comment: socially  . Drug use: Yes    Types: Marijuana  . Sexual activity: Yes    Birth control/protection: None  Lifestyle  . Physical activity:    Days per week: Not on file    Minutes per session: Not on file  . Stress: Not on file  Relationships  . Social connections:    Talks on phone: Not on file    Gets together: Not on file    Attends religious service: Not on file    Active member of club or organization: Not on file    Attends meetings of clubs or organizations: Not on file    Relationship status: Not on file  . Intimate partner violence:    Fear of current or ex partner: Not on file    Emotionally abused: Not on file    Physically abused: Not on file     Forced sexual activity: Not on file  Other Topics Concern  . Not on file  Social History Narrative   Marital status: divorced      Children: none      Employment: Scientist, product/process developmentT professional; works from home      Tobacco: 1 ppd      Alcohol:  3 ounces on weekends      Drugs:  None     Family History  Problem Relation Age of Onset  . Cancer Mother        Breast cancer  . Diabetes Maternal Grandmother      Review of Systems: General: negative for chills, fever, night sweats or weight changes.  Cardiovascular: negative for chest pain, dyspnea on exertion, edema, orthopnea, palpitations, paroxysmal nocturnal dyspnea or shortness of breath Dermatological: negative for rash Respiratory: negative for cough or wheezing Urologic: negative for hematuria Abdominal: negative for nausea, vomiting, diarrhea, bright red blood per rectum, melena, or hematemesis Neurologic: negative for visual changes, syncope, or dizziness All other systems reviewed and are otherwise negative except as noted above.    Blood pressure (!) 162/102, pulse 82, height 5\' 10"  (1.778 m), weight 183 lb 6.4 oz (83.2 kg).  General appearance: alert, cooperative and no distress Neck: no JVD Lungs: clear to auscultation bilaterally Heart: regular rate and rhythm Extremities: no edema Skin: warm and dry Neurologic: Grossly normal  EKG NSR, HR 82, LVH with repol changes  ASSESSMENT AND PLAN:   Hypertension  Still not under control.  Increase Amlodipine to 10 mg and BiDil to TID. He will continue to track his B/P as an OP and let us know how he is doing. I'm hesitant to increase the Entresto or add a diuretic back with his history of AKI on Entresto and a diuretic.   Chronic combined systolic and diastolic CHF (congestive heart failure) (HCC) Pt has a history of CHF- prior work up by Dr Jacinto HalimGanji in 2014 revealed NICM EF-27%, (low risk Myoview) then.  His most recent EF Feb 2019 showed improvement to 45-50%.  CKD (chronic  kidney disease) stage 3, GFR 30-59 ml/min (HCC) His most recent SCr was 1.42 March 2019-on Entresto and off Lasix.   PLAN  Increase BiDil to TID and Norvasc to 10 mg daily.  F/U in March 2020 with a BMP.   Corine ShelterLuke Raquan Iannone PA-C 02/17/2018 10:54 AM

## 2018-05-13 ENCOUNTER — Telehealth: Payer: Self-pay | Admitting: Physician Assistant

## 2018-05-13 NOTE — Telephone Encounter (Signed)
Left message for patient to call back.  Need to see if he wants to reschedule appt to a later date or to change his visit to a Telehealth appt with Dr. Antoine Poche. Tereso Newcomer, PA-C    05/13/2018 4:21 PM

## 2018-05-14 ENCOUNTER — Encounter: Payer: Self-pay | Admitting: Physician Assistant

## 2018-05-14 NOTE — Telephone Encounter (Signed)
Appointment was rescheduled to 06/11/2018 by Burt Knack. Tereso Newcomer, PA-C    05/14/2018 11:21 AM

## 2018-05-14 NOTE — Telephone Encounter (Signed)
Left message again for patient to call back.  DPR on file.  Left message with mother as well Rhody Musleh) to have Len Blalock call our office. Tereso Newcomer, PA-C    05/14/2018 9:02 AM

## 2018-05-14 NOTE — Telephone Encounter (Signed)
This encounter was created in error - please disregard.

## 2018-05-15 ENCOUNTER — Ambulatory Visit: Payer: BLUE CROSS/BLUE SHIELD | Admitting: Cardiology

## 2018-06-10 ENCOUNTER — Telehealth: Payer: Self-pay | Admitting: Cardiology

## 2018-06-10 NOTE — Telephone Encounter (Signed)
LVM for pre reg °

## 2018-06-10 NOTE — Telephone Encounter (Signed)
Smartphone/ my chart pending/ virtual consent/ pre reg completed °

## 2018-06-10 NOTE — Progress Notes (Signed)
Virtual Visit via Video Note   This visit type was conducted due to national recommendations for restrictions regarding the COVID-19 Pandemic (e.g. social distancing) in an effort to limit this patient's exposure and mitigate transmission in our community.  Due to his co-morbid illnesses, this patient is at least at moderate risk for complications without adequate follow up.  This format is felt to be most appropriate for this patient at this time.  All issues noted in this document were discussed and addressed.  A limited physical exam was performed with this format.  Please refer to the patient's chart for his consent to telehealth for [facility].   Evaluation Performed:  Follow-up visit  Date:  06/11/2018   ID:  Norman Dunk., DOB 28-Apr-1965, MRN [id number]  Patient Location: Home Provider Location: Home  PCP:  Julaine Fusi, NP  Cardiologist:  Rollene Rotunda, MD  Electrophysiologist:  None   Chief Complaint:  Cardiomyopathy  History of Present Illness:    Norman Treadwell. is a 53 y.o. male for follow up of NICM.  He presented to the ED 08/20/16 with CHF. An echo done 08/24/16 showedsevere LVD with an EF of 20-25%. His creatinine had bumped up to 3.14in Jan 2019 while on Entresto and a diuretic. The diuretic was stopped and Entresto held for a few weeks then resumed at a lower dose in Feb 2019. His SCr came back down to baseline- 1.42on 05/13/17.   An echo done 03/25/17 showed his EF had improved to 45-50%.  His Bidil and Norvasc were increased at the last visit.   Since I last saw him he has done well.  The patient denies any new symptoms such as chest discomfort, neck or arm discomfort. There has been no new shortness of breath, PND or orthopnea. There have been no reported palpitations, presyncope or syncope.  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    The patient does not have symptoms concerning for COVID-19 infection (fever, chills,  cough, or new shortness of breath).    Past Medical History:  Diagnosis Date  . Asthma   . CHF (congestive heart failure) (HCC) 09/29/2012  . Hypertension   . Seasonal allergies    No past surgical history on file.   Prior to Admission medications   Medication Sig Start Date End Date Taking? Authorizing Provider  amLODipine (NORVASC) 10 MG tablet Take 1 tablet (10 mg total) by mouth daily. 02/17/18   Abelino Derrick, PA-C  aspirin EC 81 MG tablet Take 1 tablet (81 mg total) by mouth daily. 08/23/16   Rollene Rotunda, MD  carvedilol (COREG) 25 MG tablet TAKE 1 TABLET BY MOUTH TWICE A DAY 02/03/18   Rollene Rotunda, MD  ENTRESTO 49-51 MG TAKE 1 TABLET BY MOUTH TWICE A DAY 01/06/18   Kilroy, Luke K, PA-C  isosorbide-hydrALAZINE (BIDIL) 20-37.5 MG tablet Take 1 tablet by mouth 3 (three) times daily. 02/17/18   Abelino Derrick, PA-C    Allergies:   Penicillins   Social History   Tobacco Use  . Smoking status: Current Every Day Smoker    Packs/day: 1.00    Types: Cigarettes  . Smokeless tobacco: Never Used  Substance Use Topics  . Alcohol use: Yes    Comment: socially  . Drug use: Yes    Types: Marijuana     Family Hx: The patient's family history includes Cancer in his mother; Diabetes in his maternal grandmother.  ROS:   Please see the history of present illness.    As stated in the HPI and negative for all other systems.   Prior CV studies:   The following studies were reviewed today:    Labs/Other Tests and Data Reviewed:    EKG:  No ECG reviewed.  Recent Labs: No results found for requested labs within last 8760 hours.   Recent Lipid Panel Lab Results  Component Value Date/Time   CHOL 143 09/29/2012 11:01 AM   TRIG 95 09/29/2012 11:01 AM   HDL 32 (L) 09/29/2012 11:01 AM   CHOLHDL 4.5 09/29/2012 11:01 AM   LDLCALC 92 09/29/2012 11:01 AM    Wt Readings from Last 3 Encounters:  06/11/18 175 lb (79.4 kg)  02/17/18 183 lb 6.4 oz (83.2 kg)  11/29/17 179 lb  (81.2 kg)     Objective:    Vital Signs:  Ht 5\' 9"  (1.753 m)   Wt 175 lb (79.4 kg)   BMI 25.84 kg/m    VITAL SIGNS:  reviewed GEN:  no acute distress EYES:  sclerae anicteric, EOMI - Extraocular Movements Intact NEURO:  alert and oriented x 3, no obvious focal deficit PSYCH:  normal affect  ASSESSMENT & PLAN:    Hypertension His BP is not controlled.  I will increase the Entresto to 97/103.  He will get a BMET in two weeks.  He will continue the other meds as listed.    Chronic combined systolic and diastolic CHF (congestive heart failure) (HCC) He has no symptoms.  Med change as a bove.  ment to 45-50%.  CKD (chronic kidney disease) stage 3, GFR 30-59 ml/min (HCC) Creat was 1.42 last year.  I will check this as above.     COVID-19 Education: The signs and symptoms of COVID-19 were discussed with the patient and how to seek care for testing (follow up with PCP or arrange E-visit).  He is not always wearing a mask at work and I encouraged him to be more careful. The importance of social distancing was discussed today.  Time:   Today, I have spent 25 minutes with the patient with telehealth technology discussing the above problems.     Medication Adjustments/Labs and Tests Ordered: Current medicines are reviewed at length with the patient today.  Concerns regarding medicines are outlined above.   Tests Ordered: No orders of the defined types were placed in this encounter.   Medication Changes: No orders of the defined types were placed in this encounter.   Disposition:  Follow up six months.  Signed, Rollene Rotunda, MD  06/11/2018 9:39 AM    [facility]

## 2018-06-11 ENCOUNTER — Encounter: Payer: Self-pay | Admitting: Cardiology

## 2018-06-11 ENCOUNTER — Telehealth (INDEPENDENT_AMBULATORY_CARE_PROVIDER_SITE_OTHER): Payer: BLUE CROSS/BLUE SHIELD | Admitting: Cardiology

## 2018-06-11 VITALS — BP 171/93 | Ht 69.0 in | Wt 175.0 lb

## 2018-06-11 DIAGNOSIS — I428 Other cardiomyopathies: Secondary | ICD-10-CM | POA: Diagnosis not present

## 2018-06-11 DIAGNOSIS — Z7189 Other specified counseling: Secondary | ICD-10-CM

## 2018-06-11 DIAGNOSIS — I10 Essential (primary) hypertension: Secondary | ICD-10-CM

## 2018-06-11 DIAGNOSIS — N183 Chronic kidney disease, stage 3 unspecified: Secondary | ICD-10-CM

## 2018-06-11 DIAGNOSIS — Z79899 Other long term (current) drug therapy: Secondary | ICD-10-CM

## 2018-06-11 MED ORDER — SACUBITRIL-VALSARTAN 97-103 MG PO TABS: 1.0000 | ORAL_TABLET | Freq: Two times a day (BID) | ORAL | 11 refills | Status: DC

## 2018-06-11 NOTE — Patient Instructions (Addendum)
Medication Instructions:  INCREASE- Entresto 97-103 mg twice a day  If you need a refill on your cardiac medications before your next appointment, please call your pharmacy.  Labwork: BMP in 2 weeks HERE IN OUR OFFICE AT [facility]    You will NOT need to fast   Take the provided lab slips with you to the lab for your blood draw.   When you have your labs (blood work) drawn today and your tests are completely normal, you will receive your results only by MyChart Message (if you have MyChart) -OR-  A paper copy in the mail.  If you have any lab test that is abnormal or we need to change your treatment, we will call you to review these results.  Testing/Procedures: None Ordered  Follow-Up: .     Your physician recommends that you schedule a follow-up appointment in: 6 Months   At [facility], you and your health needs are our priority.  As part of our continuing mission to provide you with exceptional heart care, we have created designated Provider Care Teams.  These Care Teams include your primary Cardiologist (physician) and Advanced Practice Providers (APPs -  Physician Assistants and Nurse Practitioners) who all work together to provide you with the care you need, when you need it.  Thank you for choosing [facility] at [facility]!!

## 2018-08-04 ENCOUNTER — Other Ambulatory Visit: Payer: Self-pay | Admitting: Cardiology

## 2018-09-17 ENCOUNTER — Other Ambulatory Visit: Payer: Self-pay | Admitting: Cardiology

## 2018-09-25 IMAGING — DX DG CHEST 2V
2 series · 2 of 2 positions shown · non-contrast
Comparison: 08/06/2012

CLINICAL DATA: Shortness of breath

EXAM:
CHEST  2 VIEW

[chest pa]
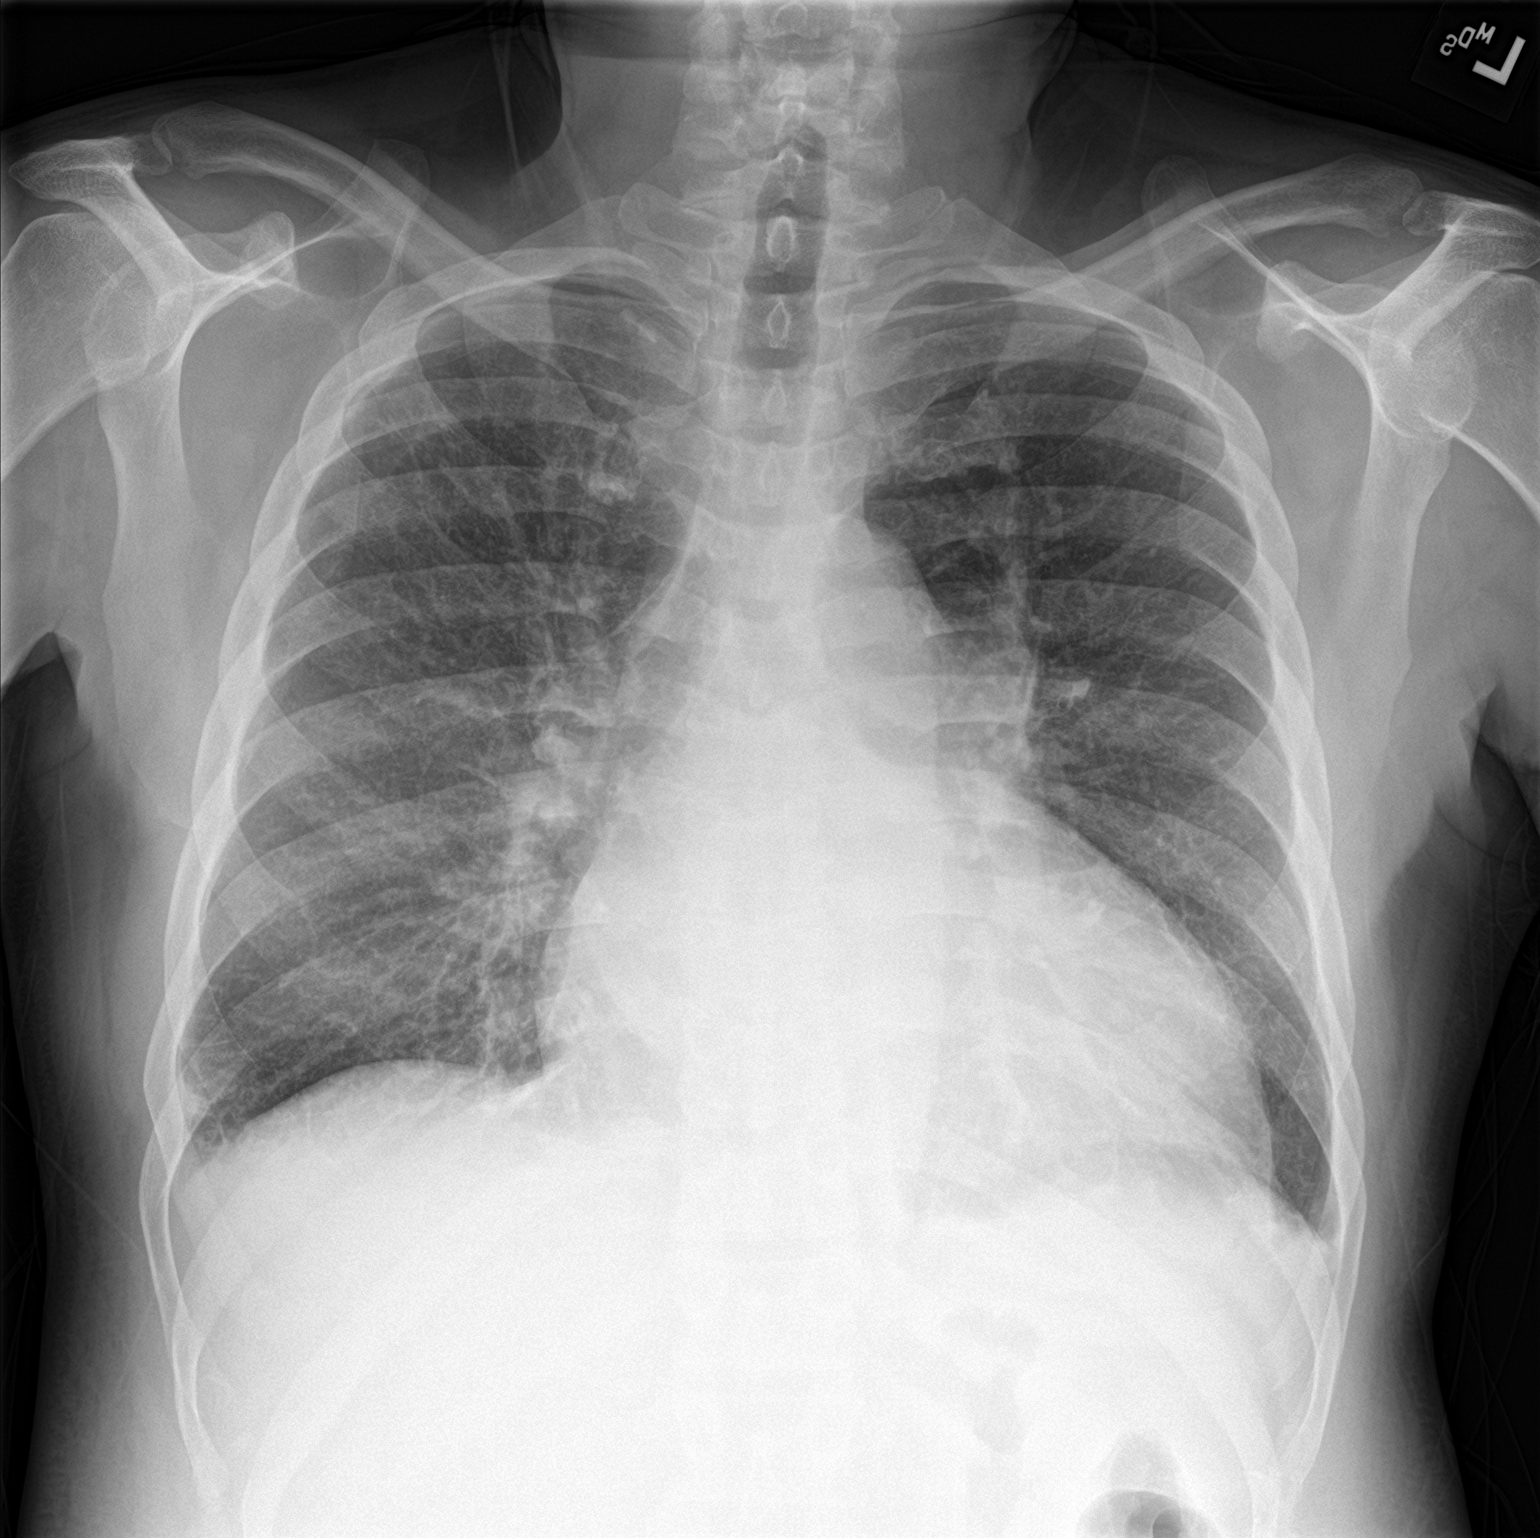

[chest lat]
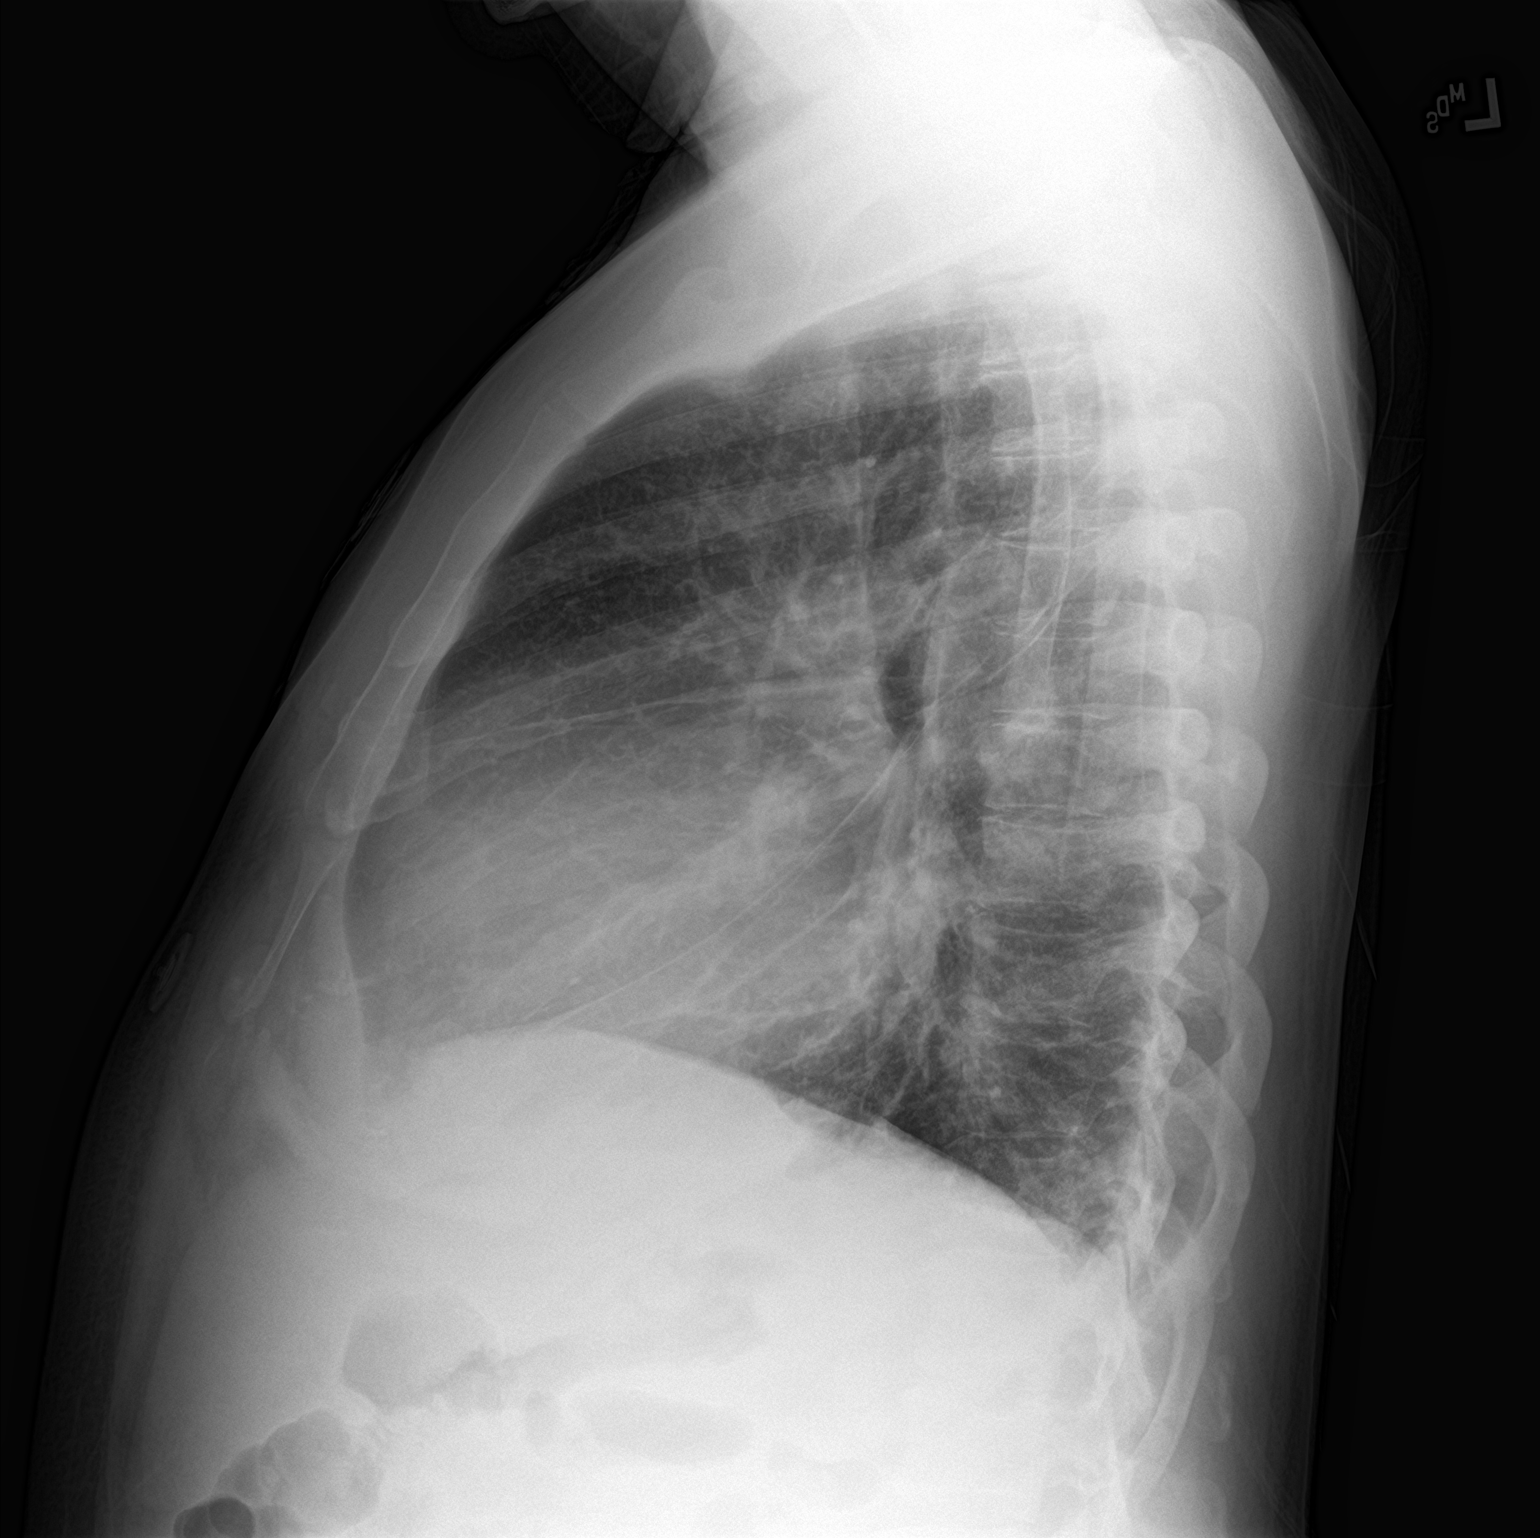

[2 of 2 positions shown; findings below may reference images not displayed]

FINDINGS: Chronic cardiomegaly. Negative aortic and hilar contours. Subtle
Kerley lines bilaterally. No pleural effusion or air bronchogram.
IMPRESSION: Suspect mild CHF.

## 2018-12-26 ENCOUNTER — Other Ambulatory Visit: Payer: Self-pay | Admitting: Cardiology

## 2019-03-27 ENCOUNTER — Other Ambulatory Visit: Payer: Self-pay | Admitting: Cardiology

## 2019-05-03 ENCOUNTER — Encounter (HOSPITAL_COMMUNITY): Payer: Self-pay | Admitting: Emergency Medicine

## 2019-05-03 ENCOUNTER — Emergency Department (HOSPITAL_COMMUNITY)
Admission: EM | Admit: 2019-05-03 | Discharge: 2019-05-04 | Disposition: A | Discharge status: Home / Self Care | Payer: BC Managed Care – PPO | Attending: Emergency Medicine | Admitting: Emergency Medicine

## 2019-05-03 ENCOUNTER — Other Ambulatory Visit: Payer: Self-pay

## 2019-05-03 DIAGNOSIS — I5042 Chronic combined systolic (congestive) and diastolic (congestive) heart failure: Secondary | ICD-10-CM | POA: Diagnosis not present

## 2019-05-03 DIAGNOSIS — M79605 Pain in left leg: Secondary | ICD-10-CM | POA: Insufficient documentation

## 2019-05-03 DIAGNOSIS — M5442 Lumbago with sciatica, left side: Secondary | ICD-10-CM

## 2019-05-03 DIAGNOSIS — F17210 Nicotine dependence, cigarettes, uncomplicated: Secondary | ICD-10-CM | POA: Insufficient documentation

## 2019-05-03 DIAGNOSIS — M545 Low back pain: Secondary | ICD-10-CM | POA: Diagnosis present

## 2019-05-03 DIAGNOSIS — Z7982 Long term (current) use of aspirin: Secondary | ICD-10-CM | POA: Insufficient documentation

## 2019-05-03 DIAGNOSIS — F1210 Cannabis abuse, uncomplicated: Secondary | ICD-10-CM | POA: Diagnosis not present

## 2019-05-03 DIAGNOSIS — I130 Hypertensive heart and chronic kidney disease with heart failure and stage 1 through stage 4 chronic kidney disease, or unspecified chronic kidney disease: Secondary | ICD-10-CM | POA: Diagnosis not present

## 2019-05-03 DIAGNOSIS — Z79899 Other long term (current) drug therapy: Secondary | ICD-10-CM | POA: Insufficient documentation

## 2019-05-03 DIAGNOSIS — N183 Chronic kidney disease, stage 3 unspecified: Secondary | ICD-10-CM | POA: Insufficient documentation

## 2019-05-03 MED ORDER — METHYLPREDNISOLONE 4 MG PO TBPK: ORAL_TABLET | ORAL | 0 refills | Status: DC

## 2019-05-03 MED ORDER — IBUPROFEN 600 MG PO TABS: 600.0000 mg | ORAL_TABLET | Freq: Four times a day (QID) | ORAL | 0 refills | Status: AC | PRN

## 2019-05-03 NOTE — ED Triage Notes (Addendum)
C/o left lower back pain that radiates down L leg since Feb 26th.  States he injured back when he dropped a box of paper after the strap broke at work and tried to catch it before it hit his foot.  HR 140.  Pt states HR is always elevated.

## 2019-05-03 NOTE — ED Notes (Signed)
Patient given discharge instructions patient verbalizes understanding. 

## 2019-05-03 NOTE — ED Provider Notes (Signed)
[facility] Provider Note   CSN: [id number] Arrival date & time: 05/03/19  1726     History Chief Complaint  Patient presents with  . Back Pain    Delorean Knutzen. is a 54 y.o. male history congestive heart failure presented emergency department with lower back pain and sciatica.  Reports that the pain began towards the end of February, when he was bent forward trying to grab heavy pile of papers that it slipped from his hands.  He felt a twinge in his back and began having lower back pain at that time.  He comes to the ED today complaining of radiation of his pain now down his left leg wrapping around towards his left knee.  He says this been new for the past few days.  He feels his pain is getting worse.  He is concerned that he cannot do his duties at work.  No fever, chills, numbness, or objective weakness on exam. No saddle anesthesia, urinary or fecal incontinence or retention. No reported history of immunosuppression, IV drug use, cancer, recent spinal surgery, recent trauma, or falls.   HPI     Past Medical History:  Diagnosis Date  . Asthma   . CHF (congestive heart failure) ([place]) 09/29/2012  . Hypertension   . Seasonal allergies     Patient Active Problem List   Diagnosis Date Noted  . Educated about COVID-19 virus infection 06/11/2018  . Tobacco abuse 11/29/2017  . CKD (chronic kidney disease) stage 3, GFR 30-59 ml/min 03/29/2017  . Non-rheumatic mitral regurgitation 01/22/2017  . NICM (nonischemic cardiomyopathy) ([place]) 09/17/2016  . Prediabetes 08/23/2016  . Healthcare maintenance 08/23/2016  . Dry skin 08/23/2016  . Essential hypertension 09/29/2012  . Chronic combined systolic and diastolic CHF (congestive heart failure) ([place]) 09/29/2012    History reviewed. No pertinent surgical history.     Family History  Problem Relation Age of Onset  . Cancer Mother        Breast cancer  . Diabetes Maternal Grandmother      Social History   Tobacco Use  . Smoking status: Current Every Day Smoker    Packs/day: 1.00    Types: Cigarettes  . Smokeless tobacco: Never Used  Substance Use Topics  . Alcohol use: Yes    Comment: socially  . Drug use: Yes    Types: Marijuana    Home Medications Prior to Admission medications   Medication Sig Start Date End Date Taking? Authorizing Provider  amLODipine (NORVASC) 10 MG tablet TAKE 1 TABLET BY MOUTH EVERY DAY 03/27/19   Minus Breeding, MD  aspirin EC 81 MG tablet Take 1 tablet (81 mg total) by mouth daily. 08/23/16   Minus Breeding, MD  BIDIL 20-37.5 MG tablet TAKE 1 TABLET BY MOUTH THREE TIMES A DAY 12/26/18   Minus Breeding, MD  carvedilol (COREG) 25 MG tablet TAKE 1 TABLET BY MOUTH TWICE A DAY 02/03/18   Minus Breeding, MD  ENTRESTO 97-103 MG TAKE 1 TABLET BY MOUTH TWICE A DAY 08/04/18   Minus Breeding, MD  ibuprofen (ADVIL) 600 MG tablet Take 1 tablet (600 mg total) by mouth every 6 (six) hours as needed for up to 7 days for mild pain or moderate pain. 05/03/19 05/10/19  Wyvonnia Dusky, MD  methylPREDNISolone (MEDROL DOSEPAK) 4 MG TBPK tablet Take as directed on package 05/03/19   Wyvonnia Dusky, MD    Allergies    Penicillins  Review of Systems   Review of  Systems  Constitutional: Negative for chills and fever.  Respiratory: Negative for cough and shortness of breath.   Cardiovascular: Negative for chest pain and palpitations.  Genitourinary: Negative for difficulty urinating and dysuria.  Musculoskeletal: Positive for arthralgias and back pain.  Skin: Negative for color change and rash.  Neurological: Negative for weakness and numbness.  All other systems reviewed and are negative.   Physical Exam Updated Vital Signs BP (!) 161/131 (BP Location: Right Arm)   Pulse (!) 140   Temp 98.3 F (36.8 C) (Oral)   Resp 18   SpO2 98%   Physical Exam Vitals and nursing note reviewed.  Constitutional:      Appearance: He is well-developed.   HENT:     Head: Normocephalic and atraumatic.  Eyes:     Conjunctiva/sclera: Conjunctivae normal.  Cardiovascular:     Rate and Rhythm: Normal rate and regular rhythm.     Heart sounds: No murmur.  Pulmonary:     Effort: Pulmonary effort is normal. No respiratory distress.  Musculoskeletal:     Cervical back: Neck supple.  Skin:    General: Skin is warm and dry.  Neurological:     Mental Status: He is alert.     Comments: Pain with left hip flexion and lying flat (lower back) No spinal midline tenderness Negative straight leg test bilaterally No numbness or sensory deficits of the lower extremities 5/5 strength in lower extremities No saddle anesthesia Ambulating steadily  Psychiatric:        Mood and Affect: Mood normal.        Behavior: Behavior normal.     ED Results / Procedures / Treatments   Labs (all labs ordered are listed, but only abnormal results are displayed) Labs Reviewed - No data to display  EKG EKG Interpretation  Date/Time:  Sunday May 03 2019 17:34:53 EDT Ventricular Rate:  129 PR Interval:  140 QRS Duration: 76 QT Interval:  322 QTC Calculation: 471 R Axis:   1 Text Interpretation: Sinus tachycardia Right atrial enlargement Left ventricular hypertrophy with repolarization abnormality ( R in aVL , Sokolow-Lyon , Cornell product ) Abnormal ECG No STEMI, no sig change from July 2018 ecg Confirmed by Alvester Chou [phone]) on 05/03/2019 5:44:43 PM   Radiology No results found.  Procedures Procedures (including critical care time)  Medications Ordered in ED Medications - No data to display  ED Course  I have reviewed the triage vital signs and the nursing notes.  Pertinent labs & imaging results that were available during my care of the patient were reviewed by me and considered in my medical decision making (see chart for details).  54 year old male presented to emergency department with lower back pain for several weeks, now with new  onset of sciatica down his left leg.  He is not in acute distress on exam.  He is able to ambulate.  He has an overall reassuring neurological exam and I do not suspect is a cord compression or cauda equina syndrome.  Explained I believe he may have a disc herniation causing his sciatica.  We discussed conservative treatment options including a Medrol Dosepak.  I told him a few days of NSAIDs would likely be okay, advised about 7 days of ibuprofen.  Told him is reasonable for him to abstain for work for a day or 2, but he should be able to return to light duties afterwards.  No heavy lifting.  He is agreeable to this work-up.  Will discharge.  Final Clinical Impression(s) / ED Diagnoses Final diagnoses:  Acute left-sided low back pain with left-sided sciatica    Rx / DC Orders ED Discharge Orders         Ordered    methylPREDNISolone (MEDROL DOSEPAK) 4 MG TBPK tablet     05/03/19 1754    ibuprofen (ADVIL) 600 MG tablet  Every 6 hours PRN     05/03/19 1754           Maela Takeda, Kermit Balo, MD 05/04/19 [phone]

## 2019-09-03 ENCOUNTER — Other Ambulatory Visit: Payer: Self-pay | Admitting: Cardiology

## 2023-01-28 ENCOUNTER — Ambulatory Visit (INDEPENDENT_AMBULATORY_CARE_PROVIDER_SITE_OTHER): Payer: BC Managed Care – PPO

## 2023-01-28 ENCOUNTER — Other Ambulatory Visit: Payer: Self-pay

## 2023-01-28 ENCOUNTER — Ambulatory Visit (HOSPITAL_COMMUNITY)
Admission: EM | Admit: 2023-01-28 | Discharge: 2023-01-28 | Disposition: A | Discharge status: Home / Self Care | Payer: BC Managed Care – PPO | Attending: Emergency Medicine | Admitting: Emergency Medicine

## 2023-01-28 ENCOUNTER — Encounter (HOSPITAL_COMMUNITY): Payer: Self-pay | Admitting: *Deleted

## 2023-01-28 DIAGNOSIS — R000 Tachycardia, unspecified: Secondary | ICD-10-CM | POA: Insufficient documentation

## 2023-01-28 DIAGNOSIS — I509 Heart failure, unspecified: Secondary | ICD-10-CM | POA: Insufficient documentation

## 2023-01-28 LAB — COMPREHENSIVE METABOLIC PANEL
ALT: 13 U/L (ref 0–44)
AST: 18 U/L (ref 15–41)
Albumin: 3.1 g/dL — ABNORMAL LOW (ref 3.5–5.0)
Alkaline Phosphatase: 58 U/L (ref 38–126)
Anion gap: 11 (ref 5–15)
BUN: 22 mg/dL — ABNORMAL HIGH (ref 6–20)
CO2: 23 mmol/L (ref 22–32)
Calcium: 8.7 mg/dL — ABNORMAL LOW (ref 8.9–10.3)
Chloride: 104 mmol/L (ref 98–111)
Creatinine, Ser: 1.89 mg/dL — ABNORMAL HIGH (ref 0.61–1.24)
GFR, Estimated: 40 mL/min — ABNORMAL LOW (ref >60)
Glucose, Bld: 90 mg/dL (ref 70–99)
Potassium: 4.2 mmol/L (ref 3.5–5.1)
Sodium: 138 mmol/L (ref 135–145)
Total Bilirubin: 0.7 mg/dL (ref <1.2)
Total Protein: 5.8 g/dL — ABNORMAL LOW (ref 6.5–8.1)

## 2023-01-28 LAB — BRAIN NATRIURETIC PEPTIDE: B Natriuretic Peptide: 1106.2 pg/mL — ABNORMAL HIGH (ref 0.0–100.0)

## 2023-01-28 NOTE — Discharge Instructions (Addendum)
I will call you if there is anything abnormal on your chest x-ray or blood work.  Please monitor symptoms; with any reoccurrence please go to the emergency department.  I would like you to reestablish with cardiology.  Please call the clinic today to make an appointment for follow-up  You can scan the QR code on the last page to get established with a new primary care provider

## 2023-01-28 NOTE — ED Provider Notes (Signed)
[facility]    CSN: [id number] Arrival date & time: 01/28/23  1137     History   Chief Complaint Chief Complaint  Patient presents with   Hypertension    HPI Norman Jones. is a 57 y.o. male.  Patient was at work today when he developed chest pain and shortness of breath. Symptoms lasted about 5 minutes and resolved. He is not currently having pain, tightness, shortness of breath, or palpitations. He feels well, but work wanted him to be seen.  History of HTN, CHF, CKD, nonischemic cardiomyopathy, mitral regurg Used to follow with cardiology.  Has not taken his medications or had a follow up in several years   On and off nasal congestion for 2 weeks or so. Has not had fever or cough.  Past Medical History:  Diagnosis Date   Asthma    CHF (congestive heart failure) ([facility]) 09/29/2012   Hypertension    Seasonal allergies     Patient Active Problem List   Diagnosis Date Noted   Educated about COVID-19 virus infection 06/11/2018   Tobacco abuse 11/29/2017   CKD (chronic kidney disease) stage 3, GFR 30-59 ml/min ([facility]) 03/29/2017   Non-rheumatic mitral regurgitation 01/22/2017   NICM (nonischemic cardiomyopathy) ([facility]) 09/17/2016   Prediabetes 08/23/2016   Healthcare maintenance 08/23/2016   Dry skin 08/23/2016   Essential hypertension 09/29/2012   Chronic combined systolic and diastolic CHF (congestive heart failure) ([facility]) 09/29/2012    History reviewed. No pertinent surgical history.     Home Medications    Prior to Admission medications   Medication Sig Start Date End Date Taking? Authorizing Provider  amLODipine (NORVASC) 10 MG tablet TAKE 1 TABLET BY MOUTH EVERY DAY 03/27/19   Rollene Rotunda, MD  aspirin EC 81 MG tablet Take 1 tablet (81 mg total) by mouth daily. 08/23/16   Rollene Rotunda, MD  BIDIL 20-37.5 MG tablet TAKE 1 TABLET BY MOUTH THREE TIMES A DAY 12/26/18   Rollene Rotunda, MD  carvedilol (COREG) 25 MG tablet TAKE 1 TABLET BY MOUTH TWICE A  DAY 02/03/18   Rollene Rotunda, MD  ENTRESTO 97-103 MG TAKE 1 TABLET BY MOUTH TWICE A DAY 09/03/19   Rollene Rotunda, MD  methylPREDNISolone (MEDROL DOSEPAK) 4 MG TBPK tablet Take as directed on package 05/03/19   Trifan, Kermit Balo, MD    Family History Family History  Problem Relation Age of Onset   Cancer Mother        Breast cancer   Diabetes Maternal Grandmother     Social History Social History   Tobacco Use   Smoking status: Every Day    Current packs/day: 1.00    Types: Cigarettes   Smokeless tobacco: Never  Vaping Use   Vaping status: Never Used  Substance Use Topics   Alcohol use: Yes    Comment: socially   Drug use: Yes    Types: Marijuana     Allergies   Penicillins   Review of Systems Review of Systems Per HPI  Physical Exam Triage Vital Signs ED Triage Vitals  Encounter Vitals Group     BP 01/28/23 1242 (!) 136/92     Systolic BP Percentile --      Diastolic BP Percentile --      Pulse Rate 01/28/23 1242 (!) 111     Resp 01/28/23 1242 20     Temp 01/28/23 1242 (!) 97.4 F (36.3 C)     Temp src --      SpO2 01/28/23 1242  97 %     Weight --      Height --      Head Circumference --      Peak Flow --      Pain Score 01/28/23 1244 0     Pain Loc --      Pain Education --      Exclude from Growth Chart --    No data found.  Updated Vital Signs BP 125/83   Pulse (!) 107   Temp 98.1 F (36.7 C)   Resp 18   SpO2 99%    Physical Exam Vitals and nursing note reviewed.  Constitutional:      General: He is not in acute distress.    Appearance: Normal appearance.  HENT:     Nose: No congestion.     Mouth/Throat:     Mouth: Mucous membranes are moist.     Pharynx: Oropharynx is clear.  Eyes:     Conjunctiva/sclera: Conjunctivae normal.     Pupils: Pupils are equal, round, and reactive to light.  Cardiovascular:     Rate and Rhythm: Regular rhythm. Tachycardia present.     Pulses: Normal pulses.          Radial pulses are 2+ on the  right side and 2+ on the left side.     Heart sounds: Murmur (faint) heard.  Pulmonary:     Effort: Pulmonary effort is normal. No respiratory distress.     Breath sounds: Normal breath sounds. No wheezing or rales.  Abdominal:     Palpations: Abdomen is soft.     Tenderness: There is no abdominal tenderness.  Musculoskeletal:     Cervical back: Normal range of motion.     Right lower leg: No edema.     Left lower leg: No edema.  Neurological:     Mental Status: He is alert and oriented to person, place, and time.     UC Treatments / Results  Labs (all labs ordered are listed, but only abnormal results are displayed) Labs Reviewed  BRAIN NATRIURETIC PEPTIDE - Abnormal; Notable for the following components:      Result Value   B Natriuretic Peptide 1,106.2 (*)    All other components within normal limits  COMPREHENSIVE METABOLIC PANEL - Abnormal; Notable for the following components:   BUN 22 (*)    Creatinine, Ser 1.89 (*)    Calcium 8.7 (*)    Total Protein 5.8 (*)    Albumin 3.1 (*)    GFR, Estimated 40 (*)    All other components within normal limits    EKG  Radiology DG Chest 2 View  Result Date: 01/28/2023 CLINICAL DATA:  Hypertension, chest pain EXAM: CHEST - 2 VIEW COMPARISON:  08/20/2016 FINDINGS: Frontal and lateral views of the chest demonstrate stable borderline enlargement of the cardiac silhouette. Diffuse interstitial prominence could reflect interstitial edema or background scarring. No airspace disease, effusion, or pneumothorax. No acute bony abnormalities. IMPRESSION: 1. Enlarged cardiac silhouette, with diffuse interstitial prominence favoring congestive heart failure and interstitial edema. Electronically Signed   By: Sharlet Salina M.D.   On: 01/28/2023 15:30    Procedures Procedures (including critical care time)  Medications Ordered in UC Medications - No data to display  Initial Impression / Assessment and Plan / UC Course  I have reviewed the  triage vital signs and the nursing notes.  Pertinent labs & imaging results that were available during my care of the patient were reviewed by me  and considered in my medical decision making (see chart for details).  Patient normotensive 136/90 Tachycardiac 111 EKG viewed live on monitor due to poor capture. Sinus tachycardia, ventricular rate of 115 BPM There are no obvious ST changes Large QRS complexes suggestive of LVH  Patient reports HR is always elevated in clinic setting   Chest xray with cardiomegaly, interstitial prominence likely scarring. No acute changes compared to reading from 6 years ago BNP 1,106. Prior lab was 6 years ago, 3,418. Suspect this is chronic and have low concern for acute exacerbation given current lack of symptoms. CMP; BUN 22, Creatinine 1.89 which is up from baseline, however has not had labs in 5 years Heart rate improved to 107 before discharge, patient is still asymptomatic.   Discussed importance of re-establishing with primary care and cardiology. Advised calling cards today to get first available appointment. Strict ED precautions are verbalized. Patient is agreeable to plan  Final Clinical Impressions(s) / UC Diagnoses   Final diagnoses:  Tachycardia  Chronic heart failure, unspecified heart failure type [facility])     Discharge Instructions      I will call you if there is anything abnormal on your chest x-ray or blood work.  Please monitor symptoms; with any reoccurrence please go to the emergency department.  I would like you to reestablish with cardiology.  Please call the clinic today to make an appointment for follow-up  You can scan the QR code on the last page to get established with a new primary care provider      ED Prescriptions   None    PDMP not reviewed this encounter.   Marlow Baars, [place] 01/28/23 1607

## 2023-01-28 NOTE — ED Triage Notes (Signed)
Pt sent home from work with High BP and CP. Pt reports not chest pain now. Pt did not have CP before going to work.

## 2023-01-29 ENCOUNTER — Telehealth (HOSPITAL_COMMUNITY): Payer: Self-pay

## 2023-02-01 ENCOUNTER — Other Ambulatory Visit: Payer: Self-pay

## 2023-02-01 ENCOUNTER — Inpatient Hospital Stay (HOSPITAL_COMMUNITY)
Admission: EM | Admit: 2023-02-01 | Discharge: 2023-02-07 | DRG: 286 | Disposition: A | Discharge status: Home / Self Care | Payer: BC Managed Care – PPO | Attending: Internal Medicine | Admitting: Internal Medicine

## 2023-02-01 ENCOUNTER — Encounter (HOSPITAL_COMMUNITY): Payer: Self-pay | Admitting: Internal Medicine

## 2023-02-01 ENCOUNTER — Emergency Department (HOSPITAL_COMMUNITY): Payer: BC Managed Care – PPO

## 2023-02-01 DIAGNOSIS — I5042 Chronic combined systolic (congestive) and diastolic (congestive) heart failure: Principal | ICD-10-CM

## 2023-02-01 DIAGNOSIS — J45909 Unspecified asthma, uncomplicated: Secondary | ICD-10-CM | POA: Diagnosis present

## 2023-02-01 DIAGNOSIS — E1122 Type 2 diabetes mellitus with diabetic chronic kidney disease: Secondary | ICD-10-CM | POA: Diagnosis present

## 2023-02-01 DIAGNOSIS — K648 Other hemorrhoids: Secondary | ICD-10-CM | POA: Diagnosis present

## 2023-02-01 DIAGNOSIS — Z91199 Patient's noncompliance with other medical treatment and regimen due to unspecified reason: Secondary | ICD-10-CM

## 2023-02-01 DIAGNOSIS — K31819 Angiodysplasia of stomach and duodenum without bleeding: Secondary | ICD-10-CM | POA: Diagnosis present

## 2023-02-01 DIAGNOSIS — I10 Essential (primary) hypertension: Secondary | ICD-10-CM | POA: Diagnosis present

## 2023-02-01 DIAGNOSIS — D649 Anemia, unspecified: Secondary | ICD-10-CM | POA: Diagnosis not present

## 2023-02-01 DIAGNOSIS — Z833 Family history of diabetes mellitus: Secondary | ICD-10-CM

## 2023-02-01 DIAGNOSIS — D123 Benign neoplasm of transverse colon: Secondary | ICD-10-CM

## 2023-02-01 DIAGNOSIS — I130 Hypertensive heart and chronic kidney disease with heart failure and stage 1 through stage 4 chronic kidney disease, or unspecified chronic kidney disease: Secondary | ICD-10-CM | POA: Diagnosis not present

## 2023-02-01 DIAGNOSIS — Z7984 Long term (current) use of oral hypoglycemic drugs: Secondary | ICD-10-CM

## 2023-02-01 DIAGNOSIS — I5043 Acute on chronic combined systolic (congestive) and diastolic (congestive) heart failure: Secondary | ICD-10-CM | POA: Diagnosis present

## 2023-02-01 DIAGNOSIS — F17210 Nicotine dependence, cigarettes, uncomplicated: Secondary | ICD-10-CM | POA: Diagnosis present

## 2023-02-01 DIAGNOSIS — D509 Iron deficiency anemia, unspecified: Secondary | ICD-10-CM

## 2023-02-01 DIAGNOSIS — N179 Acute kidney failure, unspecified: Secondary | ICD-10-CM | POA: Diagnosis present

## 2023-02-01 DIAGNOSIS — K5730 Diverticulosis of large intestine without perforation or abscess without bleeding: Secondary | ICD-10-CM | POA: Diagnosis present

## 2023-02-01 DIAGNOSIS — D124 Benign neoplasm of descending colon: Secondary | ICD-10-CM

## 2023-02-01 DIAGNOSIS — K635 Polyp of colon: Secondary | ICD-10-CM | POA: Insufficient documentation

## 2023-02-01 DIAGNOSIS — I272 Pulmonary hypertension, unspecified: Secondary | ICD-10-CM | POA: Diagnosis present

## 2023-02-01 DIAGNOSIS — R7989 Other specified abnormal findings of blood chemistry: Secondary | ICD-10-CM | POA: Diagnosis not present

## 2023-02-01 DIAGNOSIS — D120 Benign neoplasm of cecum: Secondary | ICD-10-CM

## 2023-02-01 DIAGNOSIS — I081 Rheumatic disorders of both mitral and tricuspid valves: Secondary | ICD-10-CM | POA: Diagnosis present

## 2023-02-01 DIAGNOSIS — R0789 Other chest pain: Secondary | ICD-10-CM | POA: Diagnosis present

## 2023-02-01 DIAGNOSIS — Z803 Family history of malignant neoplasm of breast: Secondary | ICD-10-CM

## 2023-02-01 DIAGNOSIS — N1832 Chronic kidney disease, stage 3b: Secondary | ICD-10-CM | POA: Diagnosis present

## 2023-02-01 DIAGNOSIS — D631 Anemia in chronic kidney disease: Secondary | ICD-10-CM | POA: Diagnosis present

## 2023-02-01 DIAGNOSIS — I428 Other cardiomyopathies: Secondary | ICD-10-CM | POA: Diagnosis present

## 2023-02-01 DIAGNOSIS — K31811 Angiodysplasia of stomach and duodenum with bleeding: Secondary | ICD-10-CM

## 2023-02-01 DIAGNOSIS — Z880 Allergy status to penicillin: Secondary | ICD-10-CM

## 2023-02-01 DIAGNOSIS — F1010 Alcohol abuse, uncomplicated: Secondary | ICD-10-CM | POA: Diagnosis present

## 2023-02-01 DIAGNOSIS — Z79899 Other long term (current) drug therapy: Secondary | ICD-10-CM

## 2023-02-01 DIAGNOSIS — E860 Dehydration: Secondary | ICD-10-CM | POA: Diagnosis present

## 2023-02-01 DIAGNOSIS — Z7982 Long term (current) use of aspirin: Secondary | ICD-10-CM

## 2023-02-01 LAB — PROTIME-INR
INR: 1.2 (ref 0.8–1.2)
Prothrombin Time: 14.9 s (ref 11.4–15.2)

## 2023-02-01 LAB — BASIC METABOLIC PANEL
Anion gap: 9 (ref 5–15)
BUN: 14 mg/dL (ref 6–20)
CO2: 25 mmol/L (ref 22–32)
Calcium: 9 mg/dL (ref 8.9–10.3)
Chloride: 107 mmol/L (ref 98–111)
Creatinine, Ser: 1.8 mg/dL — ABNORMAL HIGH (ref 0.61–1.24)
GFR, Estimated: 43 mL/min — ABNORMAL LOW (ref >60)
Glucose, Bld: 100 mg/dL — ABNORMAL HIGH (ref 70–99)
Potassium: 4.2 mmol/L (ref 3.5–5.1)
Sodium: 141 mmol/L (ref 135–145)

## 2023-02-01 LAB — CBC
HCT: 22.9 % — ABNORMAL LOW (ref 39.0–52.0)
Hemoglobin: 6.3 g/dL — CL (ref 13.0–17.0)
MCH: 19.2 pg — ABNORMAL LOW (ref 26.0–34.0)
MCHC: 27.5 g/dL — ABNORMAL LOW (ref 30.0–36.0)
MCV: 69.8 fL — ABNORMAL LOW (ref 80.0–100.0)
Platelets: 287 10*3/uL (ref 150–400)
RBC: 3.28 MIL/uL — ABNORMAL LOW (ref 4.22–5.81)
RDW: 19.5 % — ABNORMAL HIGH (ref 11.5–15.5)
WBC: 7 10*3/uL (ref 4.0–10.5)
nRBC: 0.3 % — ABNORMAL HIGH (ref 0.0–0.2)

## 2023-02-01 LAB — APTT: aPTT: 30 s (ref 24–36)

## 2023-02-01 LAB — IRON AND TIBC
Iron: 22 ug/dL — ABNORMAL LOW (ref 45–182)
Saturation Ratios: 5 % — ABNORMAL LOW (ref 17.9–39.5)
TIBC: 486 ug/dL — ABNORMAL HIGH (ref 250–450)
UIBC: 464 ug/dL

## 2023-02-01 LAB — ABO/RH: ABO/RH(D): O POS

## 2023-02-01 LAB — POC OCCULT BLOOD, ED: Fecal Occult Bld: NEGATIVE

## 2023-02-01 LAB — BRAIN NATRIURETIC PEPTIDE: B Natriuretic Peptide: 879.5 pg/mL — ABNORMAL HIGH (ref 0.0–100.0)

## 2023-02-01 LAB — RETICULOCYTES
Immature Retic Fract: 32.1 % — ABNORMAL HIGH (ref 2.3–15.9)
RBC.: 3.71 MIL/uL — ABNORMAL LOW (ref 4.22–5.81)
Retic Count, Absolute: 32.6 10*3/uL (ref 19.0–186.0)
Retic Ct Pct: 0.9 % (ref 0.4–3.1)

## 2023-02-01 LAB — TROPONIN I (HIGH SENSITIVITY)
Troponin I (High Sensitivity): 105 ng/L (ref <18)
Troponin I (High Sensitivity): 90 ng/L — ABNORMAL HIGH (ref <18)

## 2023-02-01 LAB — FERRITIN: Ferritin: 9 ng/mL — ABNORMAL LOW (ref 24–336)

## 2023-02-01 LAB — HEMOGLOBIN AND HEMATOCRIT, BLOOD
HCT: 26.4 % — ABNORMAL LOW (ref 39.0–52.0)
Hemoglobin: 7.4 g/dL — ABNORMAL LOW (ref 13.0–17.0)

## 2023-02-01 LAB — MAGNESIUM: Magnesium: 1.9 mg/dL (ref 1.7–2.4)

## 2023-02-01 LAB — PREPARE RBC (CROSSMATCH)

## 2023-02-01 LAB — D-DIMER, QUANTITATIVE: D-Dimer, Quant: 1.18 ug{FEU}/mL — ABNORMAL HIGH (ref 0.00–0.50)

## 2023-02-01 MED ORDER — ASPIRIN 81 MG PO CHEW
324.0000 mg | CHEWABLE_TABLET | Freq: Once | ORAL | Status: AC
  Administered 2023-02-01: 324 mg via ORAL
  Filled 2023-02-01: qty 4

## 2023-02-01 MED ORDER — IOHEXOL 350 MG/ML SOLN
60.0000 mL | Freq: Once | INTRAVENOUS | Status: AC | PRN
  Administered 2023-02-01: 60 mL via INTRAVENOUS

## 2023-02-01 MED ORDER — ACETAMINOPHEN 650 MG RE SUPP: 650.0000 mg | Freq: Four times a day (QID) | RECTAL | Status: DC | PRN

## 2023-02-01 MED ORDER — ONDANSETRON HCL 4 MG/2ML IJ SOLN: 4.0000 mg | Freq: Four times a day (QID) | INTRAMUSCULAR | Status: DC | PRN

## 2023-02-01 MED ORDER — ACETAMINOPHEN 325 MG PO TABS: 650.0000 mg | ORAL_TABLET | Freq: Four times a day (QID) | ORAL | Status: DC | PRN

## 2023-02-01 MED ORDER — SODIUM CHLORIDE 0.9% IV SOLUTION: Freq: Once | INTRAVENOUS | Status: DC

## 2023-02-01 MED ORDER — MELATONIN 3 MG PO TABS
3.0000 mg | ORAL_TABLET | Freq: Every evening | ORAL | Status: DC | PRN
Start: 2023-02-01 — End: 2023-02-07

## 2023-02-01 NOTE — H&P (Signed)
History and Physical      Norman Jones. RUE:[id number] DOB: 1965-09-13 DOA: 02/01/2023; DOS: 02/01/2023  PCP: Patient, No Pcp Per (will further assess) Patient coming from: home   I have personally briefly reviewed patient's old medical records in [facility]  Chief Complaint: Shortness of breath  HPI: Norman Jones. is a 57 y.o. male with medical history significant for chronic systolic/diastolic heart failure with most recent LVEF 45 to 50%, stage IIIb CKD associated baseline creatinine range 1.4-1.9, who is admitted to [facility] on 02/01/2023 with symptomatic anemia after presenting from home to [facility] ED complaining of shortness of breath.   The patient reports 4 to 5 days of intermittent shortness of breath, worse with exertion, in the absence of any orthopnea, PND, or worsening of edema in the lower extremities.  Denies any associated subjective fever, chills, rigors, or generalized myalgias.  No recent cough, hemoptysis, new lower extremity erythema or new calf tenderness.  No wheezing.   He also reports 2 episodes of sharp, nonradiating left-sided chest discomfort over the last day.  But these episodes have occurred at rest at did not intensify with exertion.  Both episodes lasted for less than 10 minutes before spontaneously resolving in the absence of any nitroglycerin.  First episode occurred while at rest on the morning of 02/01/2023, with the second such episode occurring while at rest at work.  Denies any associated palpitations, diaphoresis, nausea, vomiting, dizziness, presyncope, or syncope.  He conveys that it has been several years since he has previously seen a physician.  Most recent prior hemoglobin was found to be 12.4 in July 2018.  Not on any blood thinners as an outpatient.  Denies any recent abdominal pain prn or any recent melena or hematochezia.  No recent hematemesis or gross hematuria.     ED Course:  Vital signs in the ED were notable for the  following: Afebrile; heart rates in the low 100s; systolic blood pressures in the 1 teens to 140s; respiratory rate 14-23, oxygen saturation 99 to 100% on room air.  Labs were notable for the following: BMP notable for the following: Sodium 141, bicarbonate 25, BUN 14, creatinine 1.0 compared to 1.89 on 01/28/2023.  BNP 879 compared to most recent prior value of 1112 924 and relative to prior value of 3400 in July 2018.  High-sensitivity troponin I initially 105, with repeat value trending down to 90, without a prior high sensitive troponin I data points available for point comparison.  D-dimer 1.18.  CBC notable for white cell count 7000, hemoglobin 6.3 associated with microcytic, hypochromic findings as well as elevated RDW and relative to most recent prior hemoglobin did point of 12.4 in July 2018, platelet count 287.  Type and screen ordered.  DRE was performed by EDP which was reported to reveal brown-colored stool, with fecal occult blood test negative.  Per my interpretation, EKG in ED demonstrated the following: Relative to most recent prior EKG from 01/28/2023, today's EKG shows sinus tachycardia with heart rate 114, normal intervals, nonspecific T wave inversion in leads I, aVL, V5, V6, all of which appear unchanged from most recent prior EKG, while showing nonspecific ST elevation limited to V3, which also appears unchanged from most recent prior EKG, without any interval development of new ST elevation, also showing ST depression in V4 through V6, which appears new relative to most recent prior EKG.  Imaging in the ED, per corresponding formal radiology read, was notable for the following: CTA  chest with PE protocol showed mild cardiomegaly with small right pleural effusion, but otherwise showed no evidence of acute cardiopulmonary process, including no evidence of acute pulmonary embolism or any evidence of infiltrative edema, or pneumothorax.  While in the ED, the following were administered:  Full dose aspirin x 1, and transfusion of 1 unit PRBC was initiated.  Subsequently, the patient was admitted to for further evaluation management of symptomatic anemia in the setting of presenting atypical chest pain, with presenting labs also notable for mildly elevated troponin.     Review of Systems: As per HPI otherwise 10 point review of systems negative.   Past Medical History:  Diagnosis Date   Asthma    CHF (congestive heart failure) (HCC) 09/29/2012   Hypertension    Seasonal allergies     No past surgical history on file.  Social History:  reports that he has been smoking cigarettes. He has never used smokeless tobacco. He reports current alcohol use. He reports current drug use. Drug: Marijuana.   Allergies  Allergen Reactions   Penicillins Other (See Comments)    Pt states he was told he was allergic by his mother, unknown reaction.    Family History  Problem Relation Age of Onset   Cancer Mother        Breast cancer   Diabetes Maternal Grandmother     Family history reviewed and not pertinent    Prior to Admission medications   Medication Sig Start Date End Date Taking? Authorizing Provider  aspirin 325 MG tablet Take 650 mg by mouth daily as needed (decongestant).   Yes [provider]     Objective    Physical Exam: Vitals:   02/01/23 1715 02/01/23 1730 02/01/23 1800 02/01/23 1830  BP: 123/89 (!) 116/98 137/87 121/86  Pulse:      Resp: 16 (!) 23 (!) 22 14  Temp:      TempSrc:      SpO2:      Weight:      Height:        General: appears to be stated age; alert, oriented Skin: warm, dry, no rash Head:  AT/[place] Mouth:  Oral mucosa membranes appear moist, normal dentition Neck: supple; trachea midline Heart: Tachycardic, but regular; did not appreciate any M/R/G Lungs: CTAB, did not appreciate any wheezes, rales, or rhonchi Abdomen: + BS; soft, ND, NT Vascular: 2+ pedal pulses b/l; 2+ radial pulses b/l Extremities: no peripheral  edema, no muscle wasting Neuro: strength and sensation intact in upper and lower extremities b/l    Labs on Admission: I have personally reviewed following labs and imaging studies  CBC: Recent Labs  Lab 02/01/23 1119  WBC 7.0  HGB 6.3*  HCT 22.9*  MCV 69.8*  PLT 287   Basic Metabolic Panel: Recent Labs  Lab 01/28/23 1451 02/01/23 1119  NA 138 141  K 4.2 4.2  CL 104 107  CO2 23 25  GLUCOSE 90 100*  BUN 22* 14  CREATININE 1.89* 1.80*  CALCIUM 8.7* 9.0   GFR: Estimated Creatinine Clearance: 42.5 mL/min (A) (by C-G formula based on SCr of 1.8 mg/dL (H)). Liver Function Tests: Recent Labs  Lab 01/28/23 1451  AST 18  ALT 13  ALKPHOS 58  BILITOT 0.7  PROT 5.8*  ALBUMIN 3.1*   No results for input(s): "LIPASE", "AMYLASE" in the last 168 hours. No results for input(s): "AMMONIA" in the last 168 hours. Coagulation Profile: No results for input(s): "INR", "PROTIME" in the last 168  hours. Cardiac Enzymes: No results for input(s): "CKTOTAL", "CKMB", "CKMBINDEX", "TROPONINI" in the last 168 hours. BNP (last 3 results) No results for input(s): "PROBNP" in the last 8760 hours. HbA1C: No results for input(s): "HGBA1C" in the last 72 hours. CBG: No results for input(s): "GLUCAP" in the last 168 hours. Lipid Profile: No results for input(s): "CHOL", "HDL", "LDLCALC", "TRIG", "CHOLHDL", "LDLDIRECT" in the last 72 hours. Thyroid Function Tests: No results for input(s): "TSH", "T4TOTAL", "FREET4", "T3FREE", "THYROIDAB" in the last 72 hours. Anemia Panel: No results for input(s): "VITAMINB12", "FOLATE", "FERRITIN", "TIBC", "IRON", "RETICCTPCT" in the last 72 hours. Urine analysis:    Component Value Date/Time   BILIRUBINUR small 05/29/2012 1944   PROTEINUR 100 05/29/2012 1944   UROBILINOGEN 2.0 05/29/2012 1944   NITRITE neg 05/29/2012 1944   LEUKOCYTESUR Negative 05/29/2012 1944    Radiological Exams on Admission: CT Angio Chest PE W/Cm &/Or Wo Cm Result Date:  02/01/2023 CLINICAL DATA:  Chest pain and shortness of breath. Elevated D-dimer. EXAM: CT ANGIOGRAPHY CHEST WITH CONTRAST TECHNIQUE: Multidetector CT imaging of the chest was performed using the standard protocol during bolus administration of intravenous contrast. Multiplanar CT image reconstructions and MIPs were obtained to evaluate the vascular anatomy. RADIATION DOSE REDUCTION: This exam was performed according to the departmental dose-optimization program which includes automated exposure control, adjustment of the mA and/or kV according to patient size and/or use of iterative reconstruction technique. CONTRAST:  60mL OMNIPAQUE IOHEXOL 350 MG/ML SOLN COMPARISON:  None Available. FINDINGS: Cardiovascular: Satisfactory opacification of pulmonary arteries noted, and no pulmonary emboli identified. No evidence of thoracic aortic aneurysm. Aortic and coronary atherosclerotic calcification incidentally noted. Mild cardiomegaly. Mediastinum/Nodes: No masses or pathologically enlarged lymph nodes identified. Lungs/Pleura: Mild-to-moderate centrilobular emphysema. Mild right upper lobe scarring. No pulmonary mass or infiltrate. Tiny loculated pleural fluid collection noted in the right minor fissure. Upper abdomen: No acute findings. Musculoskeletal: No suspicious bone lesions identified. Review of the MIP images confirms the above findings. IMPRESSION: No evidence of pulmonary embolism. Mild cardiomegaly. Tiny loculated pleural fluid collection in right minor fissure. Aortic Atherosclerosis (ICD10-I70.0) and Emphysema (ICD10-J43.9). Electronically Signed   By: Danae Orleans M.D.   On: 02/01/2023 18:43   DG Chest 2 View Result Date: 02/01/2023 CLINICAL DATA:  Chest pain.  Shortness of breath. EXAM: CHEST - 2 VIEW COMPARISON:  01/28/2023. FINDINGS: Bilateral lung fields are clear. Bilateral costophrenic angles are clear. Stable cardio-mediastinal silhouette. No acute osseous abnormalities. The soft tissues are  within normal limits. IMPRESSION: No active cardiopulmonary disease. Electronically Signed   By: Jules Schick M.D.   On: 02/01/2023 12:04      Assessment/Plan   Principal Problem:   Symptomatic anemia Active Problems:   Chronic combined systolic and diastolic heart failure (HCC)   CKD stage 3b, GFR 30-44 ml/min (HCC)   Atypical chest pain   Elevated troponin     #) Symptomatic anemia: Presenting hemoglobin of 6.3, associated w/ microcytic/hypochromic findings and elevated RDW.  The specific chronicity of this finding is not entirely clear given a greater than 6-year gap in hemoglobin data points relative to most recent prior hemoglobin value of 12.4 in July 2018.  However, given the microcytic nature of his presenting anemia, along with normotensive blood pressures and only mild sinus tachycardia, presentation does not appear to be overly acute in timeframe.  Rather, in the context of absence of hemoglobin data points over the last 6 years, presentation appears most consistent with a timeframe classification of either subacute on chronic versus  subacute anemia, which appears symptomatic in the setting of the patient's shortness of breath and atypical chest discomfort.  No evidence of active bleed per history, and DRE revealed brown-colored stool with fecal occult negative finding.  At this time, differential appears to include iron deficiency anemia versus progression of anemia of chronic disease, noting his underlying history of chronic kidney disease.  Will check iron studies and perform additional diagnostic laboratory evaluation to further assess as further detailed below.  He is in the process of receiving transfusion of 1 unit PRBC, with close monitoring of ensuing hemoglobin trend, vital signs, and response and symptoms to determine need for additional PRBC transfusion.  He would likely also benefit from a nonurgent outpatient colonoscopy.  Plan: Continue transfusion of 1 unit PRBC,  following which have ordered a timed H&H to occur around 2300.  CBC in the morning followed by H&H around 900 to complete an overnight trending of acute 4-hour H&H values.  Monitor on telemetry.  Add on iron studies, B12, folic acid level, reticulocyte count, INR, PTT.  [place] pharmacologic DVT prophylaxis.  SCDs.  Fall precautions.                 #) Elevated troponin: mildly elevated initial troponin of 105, with repeat trending down to 90,. No prior high sensitivity troponin I value available for point of comparison.  Suspect that this mildly elevated troponin is on the basis of supply demand mismatch in the setting of decline in oxygen delivery capacity resulting from diminished oxygen carrying capacity in the setting of his presenting anemia, with suspected further supply/demand mismatch as a result of ensuing compensatory tachycardia resulting in relative decline in diastolic filling time associated with declining coronary artery filling time as opposed to representing a type I process due to acute plaque rupture.  There may also be exacerbating contribution from diminished renal clearance of troponin in the setting of his chronic kidney disease.  EKG shows nonspecific ST depression in V4 through V6, but otherwise shows no evidence of acute ischemic changes relative to most recent prior EKG, including no interval ST elevation, as further detailed above, including no evidence of STEMI.  CTA chest showed a small right pleural effusion, but no additional evidence of acute cardiopulmonary process, including no evidence of acute pulmonary embolism or any evidence of pneumothorax.   He has had 2 episodes of atypical chest pain over the last day, which also appear reassuring from an ACS standpoint, particular given that his troponin is trending down.  Given the timing of the episodes of chest discomfort, would have anticipated further escalation in troponin level if chest pain was on the basis  of ACS.  Overall, ACS is felt to be less likely relative to type 2 supply demand mismatch, as above, but will closely monitor on telemetry overnight while treating suspected underlying symptomatic anemia, as further described above.  It is noted that he received full dose aspirin this evening in the ED.  Plan:  Monitor on telemetry. PRN EKG for development of chest pain. Check serum Mg level and repeat BMP in the morning.  Repeat CBC in the AM. Additional evaluation and management of presenting symptomatic anemia as suspected driving force behind mildly elevated troponin, as above, including PRBC transfusion, as above.  Echocardiogram ordered for the morning.                  #) Atypical chest pain: 2 episodes of nonexertional sharp, nonradiating left-sided chest discomfort over the course  of the last day, with presenting troponin mildly elevated, but trending down, offering reassurance relative to the timeframe of onset of these 2 episodes of chest discomfort that spontaneously resolved in the absence of any nitroglycerin.  No evidence of STEMI on EKG.  As described above, ACS is felt to be less likely.  Suspect contribution from symptomatic anemia, as further detailed above with.  CTA chest showed no evidence of acute pulmonary embolism or any evidence of pneumothorax.  Given his increased shortness of breath over the last few days, felt to be a consequence of symptomatic anemia, differential includes musculoskeletal etiology, including muscular strain relating to this.  Will also add on liver enzymes and lipase to further assess, while pursuing additional evaluation and management of suspected contributory symptomatic anemia.  No additional episodes of chest discomfort while in the ED.  Plan: Monitor on telemetry.  Echocardiogram the morning.  Check liver enzymes, lipase level.  Prn acetaminophen.  Further evaluation management of presenting symptomatic anemia, including PRBC transfusion,  as outlined above.                   #) CKD Stage 3B: Documented history of such, with baseline creatinine 1.4-1.9, with presenting creatinine consistent with this baseline.    Plan: Monitor strict I's and O's and daily weights.  Attempt to avoid nephrotoxic agents.  CMP/magnesium level in the AM.                     #) Chronic systolic/diastolic heart failure: documented history of such, with most recent echocardiogram performed in February 2019, which demonstrated LVEF 45 to 50%, grade 1 diastolic dysfunction, trivial aortic regurgitation, mild to moderate mitral regurgitation mildly dilated left atrium. No clinica, radiographic, laboratory l evidence to suggest acutely decompensated heart failure at this time, with CTA chest showed no evidence of edema or infiltrate, while patient's BMP is trending down relative to most recent prior value of substantially lower than value of 3400 in July 2018.  He is however at risk for development of acutely decompensated heart failure as a consequence of his presenting anemia.  Consequently, will closely monitor for evidence of ensuing development of volume overload as outlined below.  Home diuretic regimen reportedly consists of the following: None. Home cardiac medications also include the following: None.   Plan: monitor strict I's & O's and daily weights. Repeat BMP in AM. Check serum mag level.  Echocardiogram in the morning in the setting of his presenting atypical chest pain with mildly elevated troponin.  Further evaluation management presenting symptomatic anemia, as above.      DVT prophylaxis: SCD's   Code Status: Full code Family Communication: none Disposition Plan: Per Rounding Team Consults called: none;  Admission status: Observation     I SPENT GREATER THAN 75  MINUTES IN CLINICAL CARE TIME/MEDICAL DECISION-MAKING IN COMPLETING THIS ADMISSION.      Chaney Born Reika Callanan DO [facility]  From  7PM - 7AM   02/01/2023, 7:35 PM

## 2023-02-01 NOTE — ED Triage Notes (Addendum)
Pt. Stated, I started having sharpe chest pain while at work and walking having SOB. I also had high BP. I did take medication for the chest pain which was 5 years ago. I did go to the UC on Monday for SOB.

## 2023-02-01 NOTE — ED Provider Notes (Signed)
[facility] EMERGENCY DEPARTMENT AT [facility] Provider Note   CSN: [id number] Arrival date & time: 02/01/23  1027     History  Chief Complaint  Patient presents with   Chest Pain   Shortness of Breath    Norman Jones. is a 57 y.o. male.  Patient is a 57 year old male with a past medical history of nonischemic cardiomyopathy, hypertension and CKD presenting to the emergency department with chest pain and shortness of breath.  Patient states that he has had dyspnea on exertion over the last several days.  He said he was seen at urgent care on Monday and had a normal workup but was recommended to come to the ER if he had worsening symptoms.  He states that today at work he started develop left-sided chest pain.  He states that he had some mild pain when he got up this morning that went away and then the pain returned when he was going to a meeting at work.  He states it lasted for about 5 to 10 minutes and has since resolved.  He states it was nonradiating felt like a pressure type of pain.  He denies any lower extremity swelling.  Denies any history of VTE, any recent hospitalization or surgery, recent long travel in a car or plane, hormone use or cancer history.  The history is provided by the patient.  Chest Pain Associated symptoms: shortness of breath   Shortness of Breath Associated symptoms: chest pain        Home Medications Prior to Admission medications   Medication Sig Start Date End Date Taking? Authorizing Provider  aspirin 325 MG tablet Take 650 mg by mouth daily as needed (decongestant).   Yes [provider]      Allergies    Penicillins    Review of Systems   Review of Systems  Respiratory:  Positive for shortness of breath.   Cardiovascular:  Positive for chest pain.    Physical Exam Updated Vital Signs BP (!) 119/92   Pulse (!) 107   Temp 98.5 F (36.9 C)   Resp (!) 21   Ht 5\' 9"  (1.753 m)   Wt 68 kg   SpO2 99%   BMI 22.15  kg/m  Physical Exam Vitals and nursing note reviewed.  Constitutional:      General: He is not in acute distress.    Appearance: He is well-developed.  HENT:     Head: Normocephalic and atraumatic.  Eyes:     Extraocular Movements: Extraocular movements intact.  Cardiovascular:     Rate and Rhythm: Regular rhythm. Tachycardia present.     Pulses:          Radial pulses are 2+ on the right side and 2+ on the left side.     Heart sounds: Normal heart sounds.  Pulmonary:     Effort: Pulmonary effort is normal.     Breath sounds: Normal breath sounds.  Abdominal:     Palpations: Abdomen is soft.     Tenderness: There is no abdominal tenderness.  Musculoskeletal:        General: Normal range of motion.     Cervical back: Normal range of motion and neck supple.     Right lower leg: No edema.     Left lower leg: No edema.  Skin:    General: Skin is warm and dry.  Neurological:     General: No focal deficit present.     Mental Status: He  is alert and oriented to person, place, and time.  Psychiatric:        Mood and Affect: Mood normal.        Behavior: Behavior normal.     ED Results / Procedures / Treatments   Labs (all labs ordered are listed, but only abnormal results are displayed) Labs Reviewed  BASIC METABOLIC PANEL - Abnormal; Notable for the following components:      Result Value   Glucose, Bld 100 (*)    Creatinine, Ser 1.80 (*)    GFR, Estimated 43 (*)    All other components within normal limits  CBC - Abnormal; Notable for the following components:   RBC 3.28 (*)    Hemoglobin 6.3 (*)    HCT 22.9 (*)    MCV 69.8 (*)    MCH 19.2 (*)    MCHC 27.5 (*)    RDW 19.5 (*)    nRBC 0.3 (*)    All other components within normal limits  BRAIN NATRIURETIC PEPTIDE - Abnormal; Notable for the following components:   B Natriuretic Peptide 879.5 (*)    All other components within normal limits  D-DIMER, QUANTITATIVE - Abnormal; Notable for the following components:    D-Dimer, Quant 1.18 (*)    All other components within normal limits  TROPONIN I (HIGH SENSITIVITY) - Abnormal; Notable for the following components:   Troponin I (High Sensitivity) 105 (*)    All other components within normal limits  TROPONIN I (HIGH SENSITIVITY) - Abnormal; Notable for the following components:   Troponin I (High Sensitivity) 90 (*)    All other components within normal limits  POC OCCULT BLOOD, ED  TYPE AND SCREEN  PREPARE RBC (CROSSMATCH)  ABO/RH    EKG EKG Interpretation Date/Time:  Friday February 01 2023 10:25:22 EST Ventricular Rate:  114 PR Interval:  176 QRS Duration:  88 QT Interval:  336 QTC Calculation: 463 R Axis:   1  Text Interpretation: Sinus tachycardia Possible Left atrial enlargement Left ventricular hypertrophy with repolarization abnormality ( R in aVL , Sokolow-Lyon , Cornell product , Romhilt-Estes ) Abnormal ECG Likely rate related ST depressions septally in V4-V6 though limited secondary to artifact otherwise no significant change was found Confirmed by Elayne Snare ([id number]) on 02/01/2023 11:36:44 AM  Radiology DG Chest 2 View Result Date: 02/01/2023 CLINICAL DATA:  Chest pain.  Shortness of breath. EXAM: CHEST - 2 VIEW COMPARISON:  01/28/2023. FINDINGS: Bilateral lung fields are clear. Bilateral costophrenic angles are clear. Stable cardio-mediastinal silhouette. No acute osseous abnormalities. The soft tissues are within normal limits. IMPRESSION: No active cardiopulmonary disease. Electronically Signed   By: Jules Schick M.D.   On: 02/01/2023 12:04    Procedures .Critical Care  Performed by: Rexford Maus, DO Authorized by: Rexford Maus, DO   Critical care provider statement:    Critical care time (minutes):  30   Critical care was necessary to treat or prevent imminent or life-threatening deterioration of the following conditions:  Circulatory failure   Critical care was time spent personally by me on the  following activities:  Development of treatment plan with patient or surrogate, discussions with consultants, evaluation of patient's response to treatment, examination of patient, ordering and review of laboratory studies, ordering and review of radiographic studies, ordering and performing treatments and interventions, pulse oximetry, re-evaluation of patient's condition and review of old charts     Medications Ordered in ED Medications  0.9 %  sodium chloride infusion (Manually program  via Guardrails IV Fluids) (has no administration in time range)  aspirin chewable tablet 324 mg (324 mg Oral Given 02/01/23 1318)    ED Course/ Medical Decision Making/ A&P Clinical Course as of 02/01/23 1552  Fri Feb 01, 2023  1239 Cr approximately at baseline. New anemia Hgb 6.3 from baseline normal last checked 6 years ago in our system. Will need hemoccult. Troponin 105. Will be given aspirin. Will hold off on heparin pending hemoccult. [VK]  1311 Patient agreeable to blood transfusion.  Denies any recent black or bloody stools or any previous colonoscopies. [VK]  1338 D-dimer elevated, will have CTPE study. [VK]  1552 Patient signed out to Dr. Rodena Jones pending CTPE with plan for admission. [VK]    Clinical Course User Index [VK] Rexford Maus, DO                                 Medical Decision Making This patient presents to the ED with chief complaint(s) of chest pain, SOB with pertinent past medical history of NICM, HTN, CKD which further complicates the presenting complaint. The complaint involves an extensive differential diagnosis and also carries with it a high risk of complications and morbidity.    The differential diagnosis includes ACS, arrhythmia, anemia, pneumonia, pneumothorax, pulmonary edema, pleural effusion, considering PE in the setting of his tachycardia, dehydration, electrolyte abnormality, MSK pain  Additional history obtained: Additional history obtained from  N/A Records reviewed outpatient cardiology records  ED Course and Reassessment: On arrival he is tachycardic and otherwise hemodynamically stable in no acute distress.  EKG showed sinus tachycardia with some mild rate related depressions septally otherwise no significant change from prior.  The patient will of labs including troponin and D-dimer and chest x-ray performed.  He will be closely monitored.  Independent labs interpretation:  The following labs were independently interpreted: acute anemia, elevated d-dimer, elevated troponin downtrending, downtrending BNP  Independent visualization of imaging: - I independently visualized the following imaging with scope of interpretation limited to determining acute life threatening conditions related to emergency care: CXR, which revealed no acute disease     Amount and/or Complexity of Data Reviewed Labs: ordered. Radiology: ordered.  Risk OTC drugs. Prescription drug management.          Final Clinical Impression(s) / ED Diagnoses Final diagnoses:  Symptomatic anemia  Elevated troponin    Rx / DC Orders ED Discharge Orders     None         Rexford Maus, DO 02/01/23 1552

## 2023-02-02 DIAGNOSIS — K2980 Duodenitis without bleeding: Secondary | ICD-10-CM | POA: Diagnosis not present

## 2023-02-02 DIAGNOSIS — D509 Iron deficiency anemia, unspecified: Secondary | ICD-10-CM | POA: Diagnosis present

## 2023-02-02 DIAGNOSIS — K635 Polyp of colon: Secondary | ICD-10-CM | POA: Diagnosis not present

## 2023-02-02 DIAGNOSIS — E1122 Type 2 diabetes mellitus with diabetic chronic kidney disease: Secondary | ICD-10-CM | POA: Diagnosis present

## 2023-02-02 DIAGNOSIS — J45909 Unspecified asthma, uncomplicated: Secondary | ICD-10-CM | POA: Diagnosis present

## 2023-02-02 DIAGNOSIS — I10 Essential (primary) hypertension: Secondary | ICD-10-CM

## 2023-02-02 DIAGNOSIS — E860 Dehydration: Secondary | ICD-10-CM | POA: Diagnosis present

## 2023-02-02 DIAGNOSIS — K2950 Unspecified chronic gastritis without bleeding: Secondary | ICD-10-CM | POA: Diagnosis not present

## 2023-02-02 DIAGNOSIS — I272 Pulmonary hypertension, unspecified: Secondary | ICD-10-CM | POA: Diagnosis present

## 2023-02-02 DIAGNOSIS — F1010 Alcohol abuse, uncomplicated: Secondary | ICD-10-CM | POA: Diagnosis present

## 2023-02-02 DIAGNOSIS — N1832 Chronic kidney disease, stage 3b: Secondary | ICD-10-CM | POA: Diagnosis present

## 2023-02-02 DIAGNOSIS — K31819 Angiodysplasia of stomach and duodenum without bleeding: Secondary | ICD-10-CM | POA: Diagnosis present

## 2023-02-02 DIAGNOSIS — R0789 Other chest pain: Secondary | ICD-10-CM | POA: Diagnosis not present

## 2023-02-02 DIAGNOSIS — B9681 Helicobacter pylori [H. pylori] as the cause of diseases classified elsewhere: Secondary | ICD-10-CM | POA: Diagnosis not present

## 2023-02-02 DIAGNOSIS — I429 Cardiomyopathy, unspecified: Secondary | ICD-10-CM | POA: Diagnosis not present

## 2023-02-02 DIAGNOSIS — I5023 Acute on chronic systolic (congestive) heart failure: Secondary | ICD-10-CM | POA: Diagnosis not present

## 2023-02-02 DIAGNOSIS — I081 Rheumatic disorders of both mitral and tricuspid valves: Secondary | ICD-10-CM | POA: Diagnosis present

## 2023-02-02 DIAGNOSIS — D123 Benign neoplasm of transverse colon: Secondary | ICD-10-CM | POA: Diagnosis present

## 2023-02-02 DIAGNOSIS — Z79899 Other long term (current) drug therapy: Secondary | ICD-10-CM | POA: Diagnosis not present

## 2023-02-02 DIAGNOSIS — I428 Other cardiomyopathies: Secondary | ICD-10-CM | POA: Diagnosis present

## 2023-02-02 DIAGNOSIS — F17210 Nicotine dependence, cigarettes, uncomplicated: Secondary | ICD-10-CM | POA: Diagnosis present

## 2023-02-02 DIAGNOSIS — D124 Benign neoplasm of descending colon: Secondary | ICD-10-CM | POA: Diagnosis present

## 2023-02-02 DIAGNOSIS — I130 Hypertensive heart and chronic kidney disease with heart failure and stage 1 through stage 4 chronic kidney disease, or unspecified chronic kidney disease: Secondary | ICD-10-CM | POA: Diagnosis present

## 2023-02-02 DIAGNOSIS — D649 Anemia, unspecified: Secondary | ICD-10-CM | POA: Diagnosis not present

## 2023-02-02 DIAGNOSIS — R079 Chest pain, unspecified: Secondary | ICD-10-CM | POA: Diagnosis not present

## 2023-02-02 DIAGNOSIS — K648 Other hemorrhoids: Secondary | ICD-10-CM | POA: Diagnosis present

## 2023-02-02 DIAGNOSIS — Z7984 Long term (current) use of oral hypoglycemic drugs: Secondary | ICD-10-CM | POA: Diagnosis not present

## 2023-02-02 DIAGNOSIS — R7989 Other specified abnormal findings of blood chemistry: Secondary | ICD-10-CM | POA: Diagnosis present

## 2023-02-02 DIAGNOSIS — Z7982 Long term (current) use of aspirin: Secondary | ICD-10-CM | POA: Diagnosis not present

## 2023-02-02 DIAGNOSIS — N179 Acute kidney failure, unspecified: Secondary | ICD-10-CM | POA: Diagnosis present

## 2023-02-02 DIAGNOSIS — I5043 Acute on chronic combined systolic (congestive) and diastolic (congestive) heart failure: Secondary | ICD-10-CM | POA: Diagnosis present

## 2023-02-02 DIAGNOSIS — D120 Benign neoplasm of cecum: Secondary | ICD-10-CM | POA: Diagnosis present

## 2023-02-02 DIAGNOSIS — Z880 Allergy status to penicillin: Secondary | ICD-10-CM | POA: Diagnosis not present

## 2023-02-02 DIAGNOSIS — D631 Anemia in chronic kidney disease: Secondary | ICD-10-CM | POA: Diagnosis present

## 2023-02-02 DIAGNOSIS — K5730 Diverticulosis of large intestine without perforation or abscess without bleeding: Secondary | ICD-10-CM | POA: Diagnosis present

## 2023-02-02 DIAGNOSIS — I5042 Chronic combined systolic (congestive) and diastolic (congestive) heart failure: Secondary | ICD-10-CM | POA: Diagnosis not present

## 2023-02-02 LAB — COMPREHENSIVE METABOLIC PANEL
ALT: 18 U/L (ref 0–44)
AST: 26 U/L (ref 15–41)
Albumin: 2.9 g/dL — ABNORMAL LOW (ref 3.5–5.0)
Alkaline Phosphatase: 69 U/L (ref 38–126)
Anion gap: 10 (ref 5–15)
BUN: 23 mg/dL — ABNORMAL HIGH (ref 6–20)
CO2: 23 mmol/L (ref 22–32)
Calcium: 8.7 mg/dL — ABNORMAL LOW (ref 8.9–10.3)
Chloride: 108 mmol/L (ref 98–111)
Creatinine, Ser: 1.98 mg/dL — ABNORMAL HIGH (ref 0.61–1.24)
GFR, Estimated: 38 mL/min — ABNORMAL LOW (ref >60)
Glucose, Bld: 89 mg/dL (ref 70–99)
Potassium: 4.4 mmol/L (ref 3.5–5.1)
Sodium: 141 mmol/L (ref 135–145)
Total Bilirubin: 1.1 mg/dL (ref <1.2)
Total Protein: 5.4 g/dL — ABNORMAL LOW (ref 6.5–8.1)

## 2023-02-02 LAB — CBC WITH DIFFERENTIAL/PLATELET
Abs Immature Granulocytes: 0.03 10*3/uL (ref 0.00–0.07)
Basophils Absolute: 0.1 10*3/uL (ref 0.0–0.1)
Basophils Relative: 1 %
Eosinophils Absolute: 0.2 10*3/uL (ref 0.0–0.5)
Eosinophils Relative: 2 %
HCT: 24.1 % — ABNORMAL LOW (ref 39.0–52.0)
Hemoglobin: 7.1 g/dL — ABNORMAL LOW (ref 13.0–17.0)
Immature Granulocytes: 0 %
Lymphocytes Relative: 11 %
Lymphs Abs: 1 10*3/uL (ref 0.7–4.0)
MCH: 20.4 pg — ABNORMAL LOW (ref 26.0–34.0)
MCHC: 29.5 g/dL — ABNORMAL LOW (ref 30.0–36.0)
MCV: 69.3 fL — ABNORMAL LOW (ref 80.0–100.0)
Monocytes Absolute: 0.9 10*3/uL (ref 0.1–1.0)
Monocytes Relative: 10 %
Neutro Abs: 6.9 10*3/uL (ref 1.7–7.7)
Neutrophils Relative %: 76 %
Platelets: 254 10*3/uL (ref 150–400)
RBC: 3.48 MIL/uL — ABNORMAL LOW (ref 4.22–5.81)
RDW: 20.4 % — ABNORMAL HIGH (ref 11.5–15.5)
WBC: 9.1 10*3/uL (ref 4.0–10.5)
nRBC: 0.6 % — ABNORMAL HIGH (ref 0.0–0.2)

## 2023-02-02 LAB — URINALYSIS, ROUTINE W REFLEX MICROSCOPIC
Bilirubin Urine: NEGATIVE
Glucose, UA: NEGATIVE mg/dL
Hgb urine dipstick: NEGATIVE
Ketones, ur: NEGATIVE mg/dL
Leukocytes,Ua: NEGATIVE
Nitrite: NEGATIVE
Protein, ur: NEGATIVE mg/dL
Specific Gravity, Urine: 1.036 — ABNORMAL HIGH (ref 1.005–1.030)
pH: 6 (ref 5.0–8.0)

## 2023-02-02 LAB — PREPARE RBC (CROSSMATCH)

## 2023-02-02 LAB — MAGNESIUM: Magnesium: 1.9 mg/dL (ref 1.7–2.4)

## 2023-02-02 LAB — PROTEIN / CREATININE RATIO, URINE
Creatinine, Urine: 183 mg/dL
Protein Creatinine Ratio: 0.11 mg/mg{creat} (ref 0.00–0.15)
Total Protein, Urine: 21 mg/dL

## 2023-02-02 LAB — HEMOGLOBIN AND HEMATOCRIT, BLOOD
HCT: 25.3 % — ABNORMAL LOW (ref 39.0–52.0)
Hemoglobin: 7.1 g/dL — ABNORMAL LOW (ref 13.0–17.0)

## 2023-02-02 LAB — LIPASE, BLOOD: Lipase: 37 U/L (ref 11–51)

## 2023-02-02 LAB — FOLATE: Folate: 11.4 ng/mL (ref >5.9)

## 2023-02-02 LAB — VITAMIN B12: Vitamin B-12: 304 pg/mL (ref 180–914)

## 2023-02-02 MED ORDER — FUROSEMIDE 10 MG/ML IJ SOLN
40.0000 mg | Freq: Once | INTRAMUSCULAR | Status: AC
  Administered 2023-02-02: 40 mg via INTRAVENOUS
  Filled 2023-02-02: qty 4

## 2023-02-02 MED ORDER — CARVEDILOL 3.125 MG PO TABS
3.1250 mg | ORAL_TABLET | Freq: Two times a day (BID) | ORAL | Status: DC
  Administered 2023-02-02: 3.125 mg via ORAL
  Filled 2023-02-02: qty 1

## 2023-02-02 MED ORDER — SODIUM CHLORIDE 0.9% IV SOLUTION: Freq: Once | INTRAVENOUS | Status: AC

## 2023-02-02 NOTE — Plan of Care (Signed)

## 2023-02-02 NOTE — Assessment & Plan Note (Signed)
02-02-2023 admission EKG shows LVH. See "atypical CP". Awaiting echo  02-03-2023 awaiting echo  02-04-2023 echo shows LVEF 20-25%. Cards consult pending.  02-05-2023 going for cath today.

## 2023-02-02 NOTE — Hospital Course (Addendum)
HPI:  Norman Jones. is a 57 y.o. male with medical history significant for chronic systolic/diastolic heart failure with most recent LVEF 45 to 50%, stage IIIb CKD associated baseline creatinine range 1.4-1.9, who is admitted to [facility] on 02/01/2023 with symptomatic anemia after presenting from home to [facility] ED complaining of shortness of breath.    The patient reports 4 to 5 days of intermittent shortness of breath, worse with exertion, in the absence of any orthopnea, PND, or worsening of edema in the lower extremities.  Denies any associated subjective fever, chills, rigors, or generalized myalgias.  No recent cough, hemoptysis, new lower extremity erythema or new calf tenderness.  No wheezing.    He also reports 2 episodes of sharp, nonradiating left-sided chest discomfort over the last day.  But these episodes have occurred at rest at did not intensify with exertion.  Both episodes lasted for less than 10 minutes before spontaneously resolving in the absence of any nitroglycerin.  First episode occurred while at rest on the morning of 02/01/2023, with the second such episode occurring while at rest at work.  Denies any associated palpitations, diaphoresis, nausea, vomiting, dizziness, presyncope, or syncope.   He conveys that it has been several years since he has previously seen a physician.  Most recent prior hemoglobin was found to be 12.4 in July 2018.  Not on any blood thinners as an outpatient.  Denies any recent abdominal pain prn or any recent melena or hematochezia.  No recent hematemesis or gross hematuria.  Significant Events: Admitted 02/01/2023 for symptomatic anemia   Significant Labs: Admission HgB 6.3, MCV 69.8, BUN 14, Scr 1.8 BNP 879 Iron 22(low), TIBC 486(high), %sat 5(low), ferritin 9(low) B12 304(nl) Folate 11.4(nl)  Significant Imaging Studies: Admission CTPA negative for PE Echo shows LVEF 20-25%  Antibiotic Therapy: Anti-infectives (From admission,  onward)    None       Procedures: 02-05-2023 RHC shows normal hemodynamics. Cardiac output 5.4 L/min, wedge 12 02-06-2023 EGD/colonoscopy  Consultants: Cardiology GI

## 2023-02-02 NOTE — Progress Notes (Signed)
PROGRESS NOTE    Norman Jones.  ZOX:[id number] DOB: 1965-05-14 DOA: 02/01/2023 PCP: Patient, No Pcp Per  Subjective: Pt seen and examined. Pt states he was diagnosed with CHF about 1 years ago. Seen by [facility] cardiology. Pt remembers having stress echo. He was told that his CHF was from HTN.  Pt stopped taking Entresto about 5 years ago due to cost. Has not seen any providers since then.  Noticed 2-3 weeks ago, increasing DOE. 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 He has to walk a lot in-between buildings. He noted increasing breathlessness for last 2-3 weeks. No melena or BRBPR.  He has not noticed any LE edema.  Pt states he has health insurance now with Rx coverage. He is agreeable to seeing new PCP, cards, nephrology and GI as suggested.  Pt noted to be tachycardic this AM. Pt is asymptomatic.   Hospital Course: HPI:  Norman Jones. is a 57 y.o. male with medical history significant for chronic systolic/diastolic heart failure with most recent LVEF 45 to 50%, stage IIIb CKD associated baseline creatinine range 1.4-1.9, who is admitted to [facility] on 02/01/2023 with symptomatic anemia after presenting from home to [facility] ED complaining of shortness of breath.    The patient reports 4 to 5 days of intermittent shortness of breath, worse with exertion, in the absence of any orthopnea, PND, or worsening of edema in the lower extremities.  Denies any associated subjective fever, chills, rigors, or generalized myalgias.  No recent cough, hemoptysis, new lower extremity erythema or new calf tenderness.  No wheezing.    He also reports 2 episodes of sharp, nonradiating left-sided chest discomfort over the last day.  But these episodes have occurred at rest at did not intensify with exertion.  Both episodes lasted for less than 10 minutes before spontaneously resolving in the absence of any nitroglycerin.  First episode occurred while at rest on the morning of  02/01/2023, with the second such episode occurring while at rest at work.  Denies any associated palpitations, diaphoresis, nausea, vomiting, dizziness, presyncope, or syncope.   He conveys that it has been several years since he has previously seen a physician.  Most recent prior hemoglobin was found to be 12.4 in July 2018.  Not on any blood thinners as an outpatient.  Denies any recent abdominal pain prn or any recent melena or hematochezia.  No recent hematemesis or gross hematuria.  Significant Events: Admitted 02/01/2023 for symptomatic anemia   Significant Labs: Admission HgB 6.3, MCV 69.8, BUN 14, Scr 1.8 BNP 879 Iron 22(low), TIBC 486(high), %sat 5(low), ferritin 9(low) B12 304(nl) Folate 11.4(nl)  Significant Imaging Studies: Admission CTPA negative for PE  Antibiotic Therapy: Anti-infectives (From admission, onward)    None       Procedures:   Consultants:     Assessment and Plan: * Symptomatic anemia 02-02-2023 transfused with 1 unit PRBC last night. Labs reveal iron deficiency. He will need outpatient GI evaluation at least colonoscopy. Check UA to ensure no hematuria. Repeat HgB 7.1 this AM. Will give 1 more unit or PRBC today. Give 40 mg IV lasix prior to transfusion.   Chronic combined systolic and diastolic heart failure (HCC) 02-02-2023 prior echo by Dr. 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 Awaiting repeat echo. Start coreg due to elevated HR and BP.  Don't this he is in acute CHF. No peripheral edema. No rales. RA sats 100%.  Hold on entresto for  now.  Iron deficiency anemia 02-02-2023 labs shows iron deficiency. He will need temporary po iron therapy at discharge and outpatient referral to GI for at least colonoscopy. Hemoccult negative at time of admission.  Iron/TIBC/Ferritin/ %Sat    Component Value Date/Time   IRON 22 (L) 02/01/2023 2304   TIBC 486 (H) 02/01/2023 2304   FERRITIN 9 (L) 02/01/2023 2304   IRONPCTSAT 5 (L) 02/01/2023 2304      Elevated troponin 02-02-2023 admission EKG shows LVH. See "atypical CP". Awaiting echo  Atypical chest pain 02-02-2023 troponin elevated at admission but flat. Awaiting echo. Admission EKG shows LVH.    CKD stage 3b, GFR 30-44 ml/min (HCC) 02-02-2023 last Scr was 04-2017 of 1.42. monitor Scr. Will need referral to outpatient nephrology. Check spot protein/creatinine ratio. UA today shows no protein in his urine.  Essential hypertension 02-02-2023 start coreg 3.125 mg bid.   DVT prophylaxis: SCDs Start: 02/01/23 1933     Code Status: Full Code Family Communication: no family at bedside. Pt is decisional Disposition Plan: return home Reason for continuing need for hospitalization: PRBC transfusion. Starting coreg. Awaiting echo.  Objective: Vitals:   02/01/23 2049 02/02/23 0055 02/02/23 0300 02/02/23 0753  BP: (!) 133/94 139/68 (!) 147/91 (!) 150/107  Pulse: (!) 108 (!) 106 (!) 106 (!) 111  Resp: 18 18 18 18   Temp: 97.6 F (36.4 C) 97.8 F (36.6 C) 98.7 F (37.1 C) 98.1 F (36.7 C)  TempSrc: Oral Oral Axillary   SpO2: 100% 97% 100% 100%  Weight:   63.4 kg   Height:       No intake or output data in the 24 hours ending 02/02/23 0924 Filed Weights   02/01/23 1109 02/02/23 0300  Weight: 68 kg 63.4 kg    Examination:  Physical Exam Vitals and nursing note reviewed.  Constitutional:      General: He is not in acute distress.    Appearance: He is normal weight. He is not toxic-appearing or diaphoretic.  HENT:     Head: Normocephalic and atraumatic.     Nose: Nose normal.  Eyes:     General: No scleral icterus. Cardiovascular:     Rate and Rhythm: Regular rhythm. Tachycardia present.  Pulmonary:     Effort: Pulmonary effort is normal.     Breath sounds: Normal breath sounds.  Abdominal:     General: Abdomen is flat. Bowel sounds are normal. There is no distension.     Palpations: Abdomen is soft.     Tenderness: There is no abdominal tenderness.   Musculoskeletal:     Right lower leg: No edema.     Left lower leg: No edema.  Skin:    General: Skin is warm and dry.     Capillary Refill: Capillary refill takes less than 2 seconds.  Neurological:     General: No focal deficit present.     Mental Status: He is alert and oriented to person, place, and time.     Data Reviewed: I have personally reviewed following labs and imaging studies  CBC: Recent Labs  Lab 02/01/23 1119 02/01/23 2304 02/02/23 0609  WBC 7.0  --  9.1  NEUTROABS  --   --  6.9  HGB 6.3* 7.4* 7.1*  HCT 22.9* 26.4* 24.1*  MCV 69.8*  --  69.3*  PLT 287  --  254   Basic Metabolic Panel: Recent Labs  Lab 01/28/23 1451 02/01/23 1119 02/01/23 2304 02/02/23 0609  NA 138 141  --  141  K 4.2 4.2  --  4.4  CL 104 107  --  108  CO2 23 25  --  23  GLUCOSE 90 100*  --  89  BUN 22* 14  --  23*  CREATININE 1.89* 1.80*  --  1.98*  CALCIUM 8.7* 9.0  --  8.7*  MG  --   --  1.9 1.9   GFR: Estimated Creatinine Clearance: 36 mL/min (A) (by C-G formula based on SCr of 1.98 mg/dL (H)). Liver Function Tests: Recent Labs  Lab 01/28/23 1451 02/02/23 0609  AST 18 26  ALT 13 18  ALKPHOS 58 69  BILITOT 0.7 1.1  PROT 5.8* 5.4*  ALBUMIN 3.1* 2.9*   Recent Labs  Lab 02/02/23 0609  LIPASE 37    Coagulation Profile: Recent Labs  Lab 02/01/23 2304  INR 1.2   BNP (last 3 results) Recent Labs    01/28/23 1451 02/01/23 1253  BNP 1,106.2* 879.5*   Anemia Panel: Recent Labs    02/01/23 2304  VITAMINB12 304  FOLATE 11.4  FERRITIN 9*  TIBC 486*  IRON 22*  RETICCTPCT 0.9    Radiology Studies: CT Angio Chest PE W/Cm &/Or Wo Cm Result Date: 02/01/2023 CLINICAL DATA:  Chest pain and shortness of breath. Elevated D-dimer. EXAM: CT ANGIOGRAPHY CHEST WITH CONTRAST TECHNIQUE: Multidetector CT imaging of the chest was performed using the standard protocol during bolus administration of intravenous contrast. Multiplanar CT image reconstructions and MIPs were  obtained to evaluate the vascular anatomy. RADIATION DOSE REDUCTION: This exam was performed according to the departmental dose-optimization program which includes automated exposure control, adjustment of the mA and/or kV according to patient size and/or use of iterative reconstruction technique. CONTRAST:  60mL OMNIPAQUE IOHEXOL 350 MG/ML SOLN COMPARISON:  None Available. FINDINGS: Cardiovascular: Satisfactory opacification of pulmonary arteries noted, and no pulmonary emboli identified. No evidence of thoracic aortic aneurysm. Aortic and coronary atherosclerotic calcification incidentally noted. Mild cardiomegaly. Mediastinum/Nodes: No masses or pathologically enlarged lymph nodes identified. Lungs/Pleura: Mild-to-moderate centrilobular emphysema. Mild right upper lobe scarring. No pulmonary mass or infiltrate. Tiny loculated pleural fluid collection noted in the right minor fissure. Upper abdomen: No acute findings. Musculoskeletal: No suspicious bone lesions identified. Review of the MIP images confirms the above findings. IMPRESSION: No evidence of pulmonary embolism. Mild cardiomegaly. Tiny loculated pleural fluid collection in right minor fissure. Aortic Atherosclerosis (ICD10-I70.0) and Emphysema (ICD10-J43.9). Electronically Signed   By: Danae Orleans M.D.   On: 02/01/2023 18:43   DG Chest 2 View Result Date: 02/01/2023 CLINICAL DATA:  Chest pain.  Shortness of breath. EXAM: CHEST - 2 VIEW COMPARISON:  01/28/2023. FINDINGS: Bilateral lung fields are clear. Bilateral costophrenic angles are clear. Stable cardio-mediastinal silhouette. No acute osseous abnormalities. The soft tissues are within normal limits. IMPRESSION: No active cardiopulmonary disease. Electronically Signed   By: Jules Schick M.D.   On: 02/01/2023 12:04    Scheduled Meds:  sodium chloride   Intravenous Once   sodium chloride   Intravenous Once   carvedilol  3.125 mg Oral BID WC   Continuous Infusions:   LOS: 0 days   Time  spent: 45 minutes  Carollee Herter, DO  [facility]  02/02/2023, 9:24 AM

## 2023-02-02 NOTE — Assessment & Plan Note (Signed)
02-02-2023 labs shows iron deficiency. He will need temporary po iron therapy at discharge and outpatient referral to GI for at least colonoscopy. Hemoccult negative at time of admission.  Iron/TIBC/Ferritin/ %Sat    Component Value Date/Time   IRON 22 (L) 02/01/2023 2304   TIBC 486 (H) 02/01/2023 2304   FERRITIN 9 (L) 02/01/2023 2304   IRONPCTSAT 5 (L) 02/01/2023 2304   02-05-2023 cards wants inpatient GI evaluation for anemia. GI has been consulted. 02-06-2023 for EGD/colon today.  02-07-2023 EGD shows 1 small gastric angioectasia. Cauterized. Colonic polyps removed.  GI will set him up for IV iron.

## 2023-02-02 NOTE — Assessment & Plan Note (Addendum)
02-02-2023 prior echo by Dr. 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 Awaiting repeat echo. Start coreg due to elevated HR and BP.  Don't this he is in acute CHF. No peripheral edema. No rales. RA sats 100%.  Hold on entresto for now.  02-03-2023 awaiting echo. Increase coreg to 6.25 mg bid.  02-04-2023 echo shows LVEF 20-25%. Similar to echo in 2018. In 2019,(while on GDMT) his LVEF had improved to 45-50%. Pt stopped taking CHF meds due to cost in 2019-2020 and lost to f/u.  Will need CHF consult today. Pt is euvolemic. IV lasix yesterday did not produce much of a diuresis. Scr is up to 2.54 now.  Doubt his renal function will tolerate Entresto at this time.  He may need close f/u with CHF clinic for med titration. HR improved with coreg 6.25 mg bid  02-05-2023 going to cardiac cath today.  02-06-2023 RHC yesterday showed normal hemodynamics. PCW was 12. BNP yesterday was 1010. This is probably his baseline. Dry weight around 132-134 lbs. Awaiting CHF/cards team to decide on discharge GDMT meds.  02-07-2023 cardiac MRI today. Results pending. I have reached out of CHF team to order his DC meds.  Cardiology starting farxiga and losartan. They elected not to start coreg due to chf exacerbation. They will f/u with patient in clinic next month for GDMT.

## 2023-02-02 NOTE — Assessment & Plan Note (Addendum)
02-02-2023 transfused with 1 unit PRBC last night. Labs reveal iron deficiency. He will need outpatient GI evaluation at least colonoscopy. Check UA to ensure no hematuria. Repeat HgB 7.1 this AM. Will give 1 more unit or PRBC today. Give 40 mg IV lasix prior to transfusion.  02-03-2023 HgB 9.4 g/dl after 2 unit PRBC transfusion. Outpatient GI evaluation.  02-04-2023 will refer to outpatient GI for evaluation.  02-05-2023 cards wants inpatient GI evaluation for anemia instead of outpatient evaluation that I had planned. GI has been consulted.  02-06-2023 for EGD/colonoscopy today.   02-07-2023 has small gastric angioectasia cauterized, multiple colonic polyps removed. No source of GI bleeding.

## 2023-02-02 NOTE — Assessment & Plan Note (Addendum)
02-02-2023 last Scr was 04-2017 of 1.42. monitor Scr. Will need referral to outpatient nephrology. Check spot protein/creatinine ratio. UA today shows no protein in his urine.  02-03-2023 awaiting this AM CMP. Yesterday urine protein/urine Scr ratio was normal. No proteinuria. Spot Urine Protein: Urine Creatinine Ratio Lab Results  Component Value Date/Time   LABCREA 183 02/02/2023 08:15 AM   TOTPROTUR 21 02/02/2023 08:15 AM   PROTCRRATIO 0.11 02/02/2023 08:15 AM   02-05-2023 Scr 2.29. stable.  02-06-2023 Scr down to 1.99, BUN 27

## 2023-02-02 NOTE — Assessment & Plan Note (Signed)
02-02-2023 troponin elevated at admission but flat. Awaiting echo. Admission EKG shows LVH.

## 2023-02-02 NOTE — Assessment & Plan Note (Signed)
02-02-2023 start coreg 3.125 mg bid.  02-03-2023 increase coreg to 6.25 mg bid.  02-04-2023 BP and HR stable on coreg 6.25 mg bid.  02-05-2023 stable. On coreg 6.25 mg bid. 02-06-2023 stable. 02-07-2023 CHF team stopped his Coreg on 02-05-2023. CHF team will manage his DC HTN meds.

## 2023-02-02 NOTE — Subjective & Objective (Addendum)
Pt seen and examined. Pt tolerated 2nd unit PRBC transfusion without difficulty. No SOB Still awaiting echo. I asked pt to walk the unit hallways today to see how his endurance levels are now that he has received 2 units of PRBC.  Pt had noted his endurance level/DOE going downhill. This was the original reason for evaluation at the hospital.

## 2023-02-03 ENCOUNTER — Inpatient Hospital Stay (HOSPITAL_COMMUNITY): Payer: BC Managed Care – PPO

## 2023-02-03 DIAGNOSIS — R7989 Other specified abnormal findings of blood chemistry: Secondary | ICD-10-CM

## 2023-02-03 DIAGNOSIS — R079 Chest pain, unspecified: Secondary | ICD-10-CM | POA: Diagnosis not present

## 2023-02-03 DIAGNOSIS — I10 Essential (primary) hypertension: Secondary | ICD-10-CM | POA: Diagnosis not present

## 2023-02-03 DIAGNOSIS — D649 Anemia, unspecified: Secondary | ICD-10-CM | POA: Diagnosis not present

## 2023-02-03 DIAGNOSIS — N1832 Chronic kidney disease, stage 3b: Secondary | ICD-10-CM | POA: Diagnosis not present

## 2023-02-03 DIAGNOSIS — I5042 Chronic combined systolic (congestive) and diastolic (congestive) heart failure: Secondary | ICD-10-CM | POA: Diagnosis not present

## 2023-02-03 LAB — ECHOCARDIOGRAM COMPLETE
Area-P 1/2: 6.96 cm2
Height: 69 in
MV M vel: 5.12 m/s
MV Peak grad: 104.9 mm[Hg]
Radius: 0.7 cm
S' Lateral: 6.7 cm
Single Plane A2C EF: 30.5 %
Weight: 2183.44 [oz_av]

## 2023-02-03 LAB — TYPE AND SCREEN
ABO/RH(D): O POS
Antibody Screen: NEGATIVE
Unit division: 0
Unit division: 0

## 2023-02-03 LAB — COMPREHENSIVE METABOLIC PANEL
ALT: 25 U/L (ref 0–44)
AST: 36 U/L (ref 15–41)
Albumin: 3 g/dL — ABNORMAL LOW (ref 3.5–5.0)
Alkaline Phosphatase: 71 U/L (ref 38–126)
Anion gap: 11 (ref 5–15)
BUN: 26 mg/dL — ABNORMAL HIGH (ref 6–20)
CO2: 23 mmol/L (ref 22–32)
Calcium: 9 mg/dL (ref 8.9–10.3)
Chloride: 105 mmol/L (ref 98–111)
Creatinine, Ser: 2.24 mg/dL — ABNORMAL HIGH (ref 0.61–1.24)
GFR, Estimated: 33 mL/min — ABNORMAL LOW (ref >60)
Glucose, Bld: 94 mg/dL (ref 70–99)
Potassium: 4.3 mmol/L (ref 3.5–5.1)
Sodium: 139 mmol/L (ref 135–145)
Total Bilirubin: 1.3 mg/dL — ABNORMAL HIGH (ref <1.2)
Total Protein: 5.5 g/dL — ABNORMAL LOW (ref 6.5–8.1)

## 2023-02-03 LAB — BRAIN NATRIURETIC PEPTIDE: B Natriuretic Peptide: 2756.5 pg/mL — ABNORMAL HIGH (ref 0.0–100.0)

## 2023-02-03 LAB — BPAM RBC
Blood Product Expiration Date: 202412282359
Blood Product Expiration Date: 202501012359
ISSUE DATE / TIME: 202412141039
ISSUE DATE / TIME: 202412131642
Unit Type and Rh: 5100
Unit Type and Rh: 5100

## 2023-02-03 LAB — CBC
HCT: 32.1 % — ABNORMAL LOW (ref 39.0–52.0)
Hemoglobin: 9.4 g/dL — ABNORMAL LOW (ref 13.0–17.0)
MCH: 20.7 pg — ABNORMAL LOW (ref 26.0–34.0)
MCHC: 29.3 g/dL — ABNORMAL LOW (ref 30.0–36.0)
MCV: 70.7 fL — ABNORMAL LOW (ref 80.0–100.0)
Platelets: 288 10*3/uL (ref 150–400)
RBC: 4.54 MIL/uL (ref 4.22–5.81)
RDW: 21.5 % — ABNORMAL HIGH (ref 11.5–15.5)
WBC: 10 10*3/uL (ref 4.0–10.5)
nRBC: 0.6 % — ABNORMAL HIGH (ref 0.0–0.2)

## 2023-02-03 MED ORDER — FUROSEMIDE 10 MG/ML IJ SOLN
40.0000 mg | Freq: Every day | INTRAMUSCULAR | Status: DC
  Administered 2023-02-03 – 2023-02-04 (×2): 40 mg via INTRAVENOUS
  Filled 2023-02-03 (×2): qty 4

## 2023-02-03 MED ORDER — CARVEDILOL 6.25 MG PO TABS
6.2500 mg | ORAL_TABLET | Freq: Two times a day (BID) | ORAL | Status: DC
  Administered 2023-02-03 – 2023-02-05 (×4): 6.25 mg via ORAL
  Filled 2023-02-03 (×5): qty 1

## 2023-02-03 MED ORDER — FUROSEMIDE 40 MG PO TABS: 40.0000 mg | ORAL_TABLET | Freq: Every day | ORAL | Status: DC

## 2023-02-03 NOTE — Progress Notes (Signed)
   Called pt and updated him on echo and decreased LVEF. Will give 1 dose of IV lasix as BNP has dramatically increased since PRBC transfusion. Scr has bumped to 2.24 from 1.98. cards will be consulted.  Carollee Herter, DO [facility]

## 2023-02-03 NOTE — Plan of Care (Signed)

## 2023-02-03 NOTE — Progress Notes (Signed)
PROGRESS NOTE    Norman Jones.  JXB:[id number] DOB: [birth year] DOA: 02/01/2023 PCP: Patient, No Pcp Per  Subjective: Pt seen and examined. Pt tolerated 2nd unit PRBC transfusion without difficulty. No SOB Still awaiting echo. I asked pt to walk the unit hallways today to see how his endurance levels are now that he has received 2 units of PRBC.  Pt had noted his endurance level/DOE going downhill. This was the original reason for evaluation at the hospital.   Hospital Course: HPI:  Norman Jones. is a 57 y.o. male with medical history significant for chronic systolic/diastolic heart failure with most recent LVEF 45 to 50%, stage IIIb CKD associated baseline creatinine range 1.4-1.9, who is admitted to [facility] on 02/01/2023 with symptomatic anemia after presenting from home to [facility] ED complaining of shortness of breath.    The patient reports 4 to 5 days of intermittent shortness of breath, worse with exertion, in the absence of any orthopnea, PND, or worsening of edema in the lower extremities.  Denies any associated subjective fever, chills, rigors, or generalized myalgias.  No recent cough, hemoptysis, new lower extremity erythema or new calf tenderness.  No wheezing.    He also reports 2 episodes of sharp, nonradiating left-sided chest discomfort over the last day.  But these episodes have occurred at rest at did not intensify with exertion.  Both episodes lasted for less than 10 minutes before spontaneously resolving in the absence of any nitroglycerin.  First episode occurred while at rest on the morning of 02/01/2023, with the second such episode occurring while at rest at work.  Denies any associated palpitations, diaphoresis, nausea, vomiting, dizziness, presyncope, or syncope.   He conveys that it has been several years since he has previously seen a physician.  Most recent prior hemoglobin was found to be 12.4 in July 2018.  Not on any blood thinners as an outpatient.   Denies any recent abdominal pain prn or any recent melena or hematochezia.  No recent hematemesis or gross hematuria.  Significant Events: Admitted 02/01/2023 for symptomatic anemia   Significant Labs: Admission HgB 6.3, MCV 69.8, BUN 14, Scr 1.8 BNP 879 Iron 22(low), TIBC 486(high), %sat 5(low), ferritin 9(low) B12 304(nl) Folate 11.4(nl)  Significant Imaging Studies: Admission CTPA negative for PE  Antibiotic Therapy: Anti-infectives (From admission, onward)    None       Procedures:   Consultants:     Assessment and Plan: * Symptomatic anemia 02-02-2023 transfused with 1 unit PRBC last night. Labs reveal iron deficiency. He will need outpatient GI evaluation at least colonoscopy. Check UA to ensure no hematuria. Repeat HgB 7.1 this AM. Will give 1 more unit or PRBC today. Give 40 mg IV lasix prior to transfusion.  02-03-2023 HgB 9.4 g/dl after 2 unit PRBC transfusion. Outpatient GI evaluation.  Chronic combined systolic and diastolic heart failure (HCC) 02-02-2023 prior echo by Dr. 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 Awaiting repeat echo. Start coreg due to elevated HR and BP.  Don't this he is in acute CHF. No peripheral edema. No rales. RA sats 100%.  Hold on entresto for now.  02-03-2023 awaiting echo. Increase coreg to 6.25 mg bid.  Iron deficiency anemia 02-02-2023 labs shows iron deficiency. He will need temporary po iron therapy at discharge and outpatient referral to GI for at least colonoscopy. Hemoccult negative at time of admission.  Iron/TIBC/Ferritin/ %Sat    Component Value Date/Time   IRON 22 (L) 02/01/2023 2304  TIBC 486 (H) 02/01/2023 2304   FERRITIN 9 (L) 02/01/2023 2304   IRONPCTSAT 5 (L) 02/01/2023 2304     Elevated troponin 02-02-2023 admission EKG shows LVH. See "atypical CP". Awaiting echo  02-03-2023 awaiting echo  Atypical chest pain 02-02-2023 troponin elevated at admission but flat. Awaiting echo. Admission EKG shows  LVH.    CKD stage 3b, GFR 30-44 ml/min (HCC) 02-02-2023 last Scr was 04-2017 of 1.42. monitor Scr. Will need referral to outpatient nephrology. Check spot protein/creatinine ratio. UA today shows no protein in his urine.  02-03-2023 awaiting this AM CMP. Yesterday urine protein/urine Scr ratio was normal. No proteinuria. Spot Urine Protein: Urine Creatinine Ratio Lab Results  Component Value Date/Time   LABCREA 183 02/02/2023 08:15 AM   TOTPROTUR 21 02/02/2023 08:15 AM   PROTCRRATIO 0.11 02/02/2023 08:15 AM     Essential hypertension 02-02-2023 start coreg 3.125 mg bid.  02-03-2023 increase coreg to 6.25 mg bid.       DVT prophylaxis: SCDs Start: 02/01/23 1933    Code Status: Full Code Family Communication: no family at bedside. He is decisional. Disposition Plan: return home Reason for continuing need for hospitalization: awaiting echo. Titrating coreg  Objective: Vitals:   02/02/23 1640 02/02/23 1956 02/03/23 0336 02/03/23 0752  BP: (!) 132/95 (!) 125/93 (!) 133/95 (!) 130/95  Pulse: (!) 109 (!) 101 (!) 104 95  Resp: 18 18 18 18   Temp:  97.6 F (36.4 C) (!) 97.5 F (36.4 C) 98.2 F (36.8 C)  TempSrc:  Oral Oral Oral  SpO2: 100% 100% 100% 100%  Weight:   61.9 kg   Height:        Intake/Output Summary (Last 24 hours) at 02/03/2023 0904 Last data filed at 02/03/2023 0800 Gross per 24 hour  Intake 1320.17 ml  Output --  Net 1320.17 ml   Filed Weights   02/01/23 1109 02/02/23 0300 02/03/23 0336  Weight: 68 kg 63.4 kg 61.9 kg    Examination:  Physical Exam Vitals and nursing note reviewed.  Constitutional:      General: He is not in acute distress.    Appearance: He is normal weight. He is not toxic-appearing or diaphoretic.  HENT:     Head: Normocephalic and atraumatic.  Eyes:     General: No scleral icterus. Cardiovascular:     Rate and Rhythm: Regular rhythm. Tachycardia present.     Pulses: Normal pulses.  Pulmonary:     Effort: Pulmonary  effort is normal.     Breath sounds: Normal breath sounds.  Abdominal:     General: Abdomen is flat. Bowel sounds are normal. There is no distension.     Palpations: Abdomen is soft.  Musculoskeletal:     Right lower leg: No edema.     Left lower leg: No edema.  Skin:    General: Skin is warm and dry.     Capillary Refill: Capillary refill takes less than 2 seconds.  Neurological:     General: No focal deficit present.     Mental Status: He is alert and oriented to person, place, and time.     Data Reviewed: I have personally reviewed following labs and imaging studies  CBC: Recent Labs  Lab 02/01/23 1119 02/01/23 2304 02/02/23 0609 02/02/23 0844 02/03/23 0737  WBC 7.0  --  9.1  --  10.0  NEUTROABS  --   --  6.9  --   --   HGB 6.3* 7.4* 7.1* 7.1* 9.4*  HCT 22.9* 26.4*  24.1* 25.3* 32.1*  MCV 69.8*  --  69.3*  --  70.7*  PLT 287  --  254  --  288   Basic Metabolic Panel: Recent Labs  Lab 01/28/23 1451 02/01/23 1119 02/01/23 2304 02/02/23 0609  NA 138 141  --  141  K 4.2 4.2  --  4.4  CL 104 107  --  108  CO2 23 25  --  23  GLUCOSE 90 100*  --  89  BUN 22* 14  --  23*  CREATININE 1.89* 1.80*  --  1.98*  CALCIUM 8.7* 9.0  --  8.7*  MG  --   --  1.9 1.9   GFR: Estimated Creatinine Clearance: 35.2 mL/min (A) (by C-G formula based on SCr of 1.98 mg/dL (H)). Liver Function Tests: Recent Labs  Lab 01/28/23 1451 02/02/23 0609  AST 18 26  ALT 13 18  ALKPHOS 58 69  BILITOT 0.7 1.1  PROT 5.8* 5.4*  ALBUMIN 3.1* 2.9*   Recent Labs  Lab 02/02/23 0609  LIPASE 37    Coagulation Profile: Recent Labs  Lab 02/01/23 2304  INR 1.2   BNP (last 3 results) Recent Labs    01/28/23 1451 02/01/23 1253  BNP 1,106.2* 879.5*   Anemia Panel: Recent Labs    02/01/23 2304  VITAMINB12 304  FOLATE 11.4  FERRITIN 9*  TIBC 486*  IRON 22*  RETICCTPCT 0.9   Radiology Studies: CT Angio Chest PE W/Cm &/Or Wo Cm Result Date: 02/01/2023 CLINICAL DATA:  Chest pain  and shortness of breath. Elevated D-dimer. EXAM: CT ANGIOGRAPHY CHEST WITH CONTRAST TECHNIQUE: Multidetector CT imaging of the chest was performed using the standard protocol during bolus administration of intravenous contrast. Multiplanar CT image reconstructions and MIPs were obtained to evaluate the vascular anatomy. RADIATION DOSE REDUCTION: This exam was performed according to the departmental dose-optimization program which includes automated exposure control, adjustment of the mA and/or kV according to patient size and/or use of iterative reconstruction technique. CONTRAST:  60mL OMNIPAQUE IOHEXOL 350 MG/ML SOLN COMPARISON:  None Available. FINDINGS: Cardiovascular: Satisfactory opacification of pulmonary arteries noted, and no pulmonary emboli identified. No evidence of thoracic aortic aneurysm. Aortic and coronary atherosclerotic calcification incidentally noted. Mild cardiomegaly. Mediastinum/Nodes: No masses or pathologically enlarged lymph nodes identified. Lungs/Pleura: Mild-to-moderate centrilobular emphysema. Mild right upper lobe scarring. No pulmonary mass or infiltrate. Tiny loculated pleural fluid collection noted in the right minor fissure. Upper abdomen: No acute findings. Musculoskeletal: No suspicious bone lesions identified. Review of the MIP images confirms the above findings. IMPRESSION: No evidence of pulmonary embolism. Mild cardiomegaly. Tiny loculated pleural fluid collection in right minor fissure. Aortic Atherosclerosis (ICD10-I70.0) and Emphysema (ICD10-J43.9). Electronically Signed   By: Danae Orleans M.D.   On: 02/01/2023 18:43   DG Chest 2 View Result Date: 02/01/2023 CLINICAL DATA:  Chest pain.  Shortness of breath. EXAM: CHEST - 2 VIEW COMPARISON:  01/28/2023. FINDINGS: Bilateral lung fields are clear. Bilateral costophrenic angles are clear. Stable cardio-mediastinal silhouette. No acute osseous abnormalities. The soft tissues are within normal limits. IMPRESSION: No active  cardiopulmonary disease. Electronically Signed   By: Jules Schick M.D.   On: 02/01/2023 12:04    Scheduled Meds:  carvedilol  6.25 mg Oral BID WC   Continuous Infusions:   LOS: 1 day   Time spent: 40 minutes  Carollee Herter, DO  [facility]  02/03/2023, 9:04 AM

## 2023-02-03 NOTE — Progress Notes (Signed)
  Echocardiogram 2D Echocardiogram has been performed.  Delcie Roch 02/03/2023, 11:18 AM

## 2023-02-03 NOTE — Plan of Care (Signed)

## 2023-02-03 NOTE — Progress Notes (Signed)
  Echocardiogram 2D Echocardiogram has been performed.  Norman Jones 02/03/2023, 11:15 AM

## 2023-02-04 ENCOUNTER — Other Ambulatory Visit (HOSPITAL_COMMUNITY): Payer: Self-pay

## 2023-02-04 ENCOUNTER — Encounter (HOSPITAL_COMMUNITY): Payer: Self-pay | Admitting: Internal Medicine

## 2023-02-04 DIAGNOSIS — D649 Anemia, unspecified: Secondary | ICD-10-CM | POA: Diagnosis not present

## 2023-02-04 DIAGNOSIS — I429 Cardiomyopathy, unspecified: Secondary | ICD-10-CM | POA: Diagnosis not present

## 2023-02-04 DIAGNOSIS — I5042 Chronic combined systolic (congestive) and diastolic (congestive) heart failure: Secondary | ICD-10-CM | POA: Diagnosis not present

## 2023-02-04 DIAGNOSIS — N1832 Chronic kidney disease, stage 3b: Secondary | ICD-10-CM | POA: Diagnosis not present

## 2023-02-04 DIAGNOSIS — I10 Essential (primary) hypertension: Secondary | ICD-10-CM | POA: Diagnosis not present

## 2023-02-04 LAB — BASIC METABOLIC PANEL
Anion gap: 9 (ref 5–15)
BUN: 32 mg/dL — ABNORMAL HIGH (ref 6–20)
CO2: 26 mmol/L (ref 22–32)
Calcium: 8.7 mg/dL — ABNORMAL LOW (ref 8.9–10.3)
Chloride: 102 mmol/L (ref 98–111)
Creatinine, Ser: 2.54 mg/dL — ABNORMAL HIGH (ref 0.61–1.24)
GFR, Estimated: 28 mL/min — ABNORMAL LOW (ref >60)
Glucose, Bld: 99 mg/dL (ref 70–99)
Potassium: 4.2 mmol/L (ref 3.5–5.1)
Sodium: 137 mmol/L (ref 135–145)

## 2023-02-04 MED ORDER — SODIUM CHLORIDE 0.9% FLUSH
10.0000 mL | Freq: Two times a day (BID) | INTRAVENOUS | Status: DC
  Administered 2023-02-04 – 2023-02-05 (×2): 10 mL via INTRAVENOUS

## 2023-02-04 NOTE — H&P (View-Only) (Signed)
Cardiology Consultation   Patient ID: Norman Jones. MRN: [id number]; DOB: 26-Apr-1965  Admit date: 02/01/2023 Date of Consult: 02/04/2023  PCP:  Patient, No Pcp Per   [facility] Providers Cardiologist:  Rollene Rotunda, MD        Patient Profile:   Norman Jones. is a 57 y.o. male with a history of non-ischemic cardiomyopathy/ chronic HFmrEF with EF as low as 20-25% in 2018 bu improved to 45-50% on last Echo in 03/2017, hypertension, CKD stage IIIb, and asthma who is being seen 02/04/2023 for the evaluation of CHF and severe mitral regurgitation at the request of Dr. Imogene Burn.    History of Present Illness:   Mr. Norman Jones is a 57 year old male with the above history who is followed by Dr. Antoine Poche has not been seen since 2020.  He has a history of nonischemic cardiomyopathy dating back to 2014.  Echo at that time showed LVEF of 27% with severe global hypokinesis and grade 1 diastolic dysfunction mild MR.  Myoview was negative.  He subsequently did well but then lost his job and could not afford his medications.  He was in the ED in 08/2016 with CHF.  Echo showed LVEF of 20-25% with diffuse hypokinesis, mildly dilated RV with severe RV dysfunction, moderate to severe MR, and moderate TR.  He was restarted on GDMT and repeat echo in 03/2017 showed significant improvement of LVEF to 45-50% with diffuse hypokinesis and grade 1 diastolic dysfunction as well as mild to moderate MR.  He was last seen by Dr. Antoine Poche in 05/2018 for a virtual visit at which time he was doing well with no chest pain or new CHF symptoms.  He has been lost to follow-up since then.  He was seen at an Urgent Care on 01/28/2023 for evaluation of chest pain and shortness of breath that started at work.  He was noted to be hypertensive with BP of 136/92 and tachycardic.  KG showed sinus tachycardia, rate 115 bpm, with severe LVH with reseated repolarization changes.  BNP was elevated at 1, 106.  X-ray showed  cardiomegaly with interstitial prominence likely scarring.  He device to get reestablished with PCP and Cardiology.  Patient presented to the ED on 02/01/2023 for further evaluation of chest pain and shortness of breath.  Upon arrival to the ED, he was mildly hypertensive and tachycardic.  EKG again showed sinus tachycardia, rate 114 bpm, with severe LVH and associated repolarization changes.  High-sensitivity troponin elevated at 105 >> 90.  BNP 879.  D-dimer elevated at 1.18.  Chest x-ray showed no acute findings.  Chest CTA showed mild cardiomegaly but no evidence of PE. WBC 7.0, Hgb 6.3, Plts 287. Na 141, K 4.2, Glucose 100, BUN 14, Cr 1.80.  Hemoccult was negative.  He was admitted for diabetic anemia and has received 2 units of PRBCs.   Echo on 02/03/2023 showed LVEF of 20-25% with global hypokinesis and mild LVH, and grade 3 diastolic dysfunction as well as normal RV size and function with severely elevated PASP, biatrial enlargement (left > right), and severe MR (possible etiology malcoaptation from dilated LV).  He was started on IV Lasix and cardiology consulted for further evaluation.  At the time of this evaluation, patient is resting comfortably in no acute distress.  He states he has not seen a doctor been taking any medications for a couple of years.  He was previously on Entresto was doing very well on this but it became too expensive.  He  states he was in his usual state of health until 01/28/2023 when he started to notice some dyspnea on exertion while at work.  He works Consulting civil engineer at [employer] in [place] and noticed he was getting short of breath when walking from building to building.  This minded him of how he felt when he was originally diagnosed with CHF.  This is why he decided to go to the Urgent Care that day and symptoms continued which is what ultimately prompted him to come to the ED. There  mention some chest pain from  but patient denies this with me.  He describes a feeling like  he could not take a deep breath but no chest pain. No shortness of breath at rest. No orthopnea, PND, or edema. No weight gain. No palpitations, lightheadedness, dizziness, or syncope. He describes some nasal congestion which is not uncommon this time of year but no other URI symptoms. No GI symptoms. No abnormal bleeding in urine or stools.  He has a long smoking history. He has smoked for about 40 years and currently smokes 1 pack every 2-3 days. He also has significant alcohol use and reports drinking a Fifth of Bourbon every 1.5 weeks. He also endorses marijuana uses but no other drug use (no cocaine or amphetamines).   Past Medical History:  Diagnosis Date   Asthma    CHF (congestive heart failure) (HCC) 09/29/2012   Hypertension    Seasonal allergies     History reviewed. No pertinent surgical history.   Home Medications:  Prior to Admission medications   Medication Sig Start Date End Date Taking? Authorizing Provider  aspirin 325 MG tablet Take 650 mg by mouth daily as needed (decongestant).   Yes [provider]    Inpatient Medications: Scheduled Meds:  carvedilol  6.25 mg Oral BID WC   Continuous Infusions:  PRN Meds: acetaminophen **OR** acetaminophen, melatonin, ondansetron (ZOFRAN) IV  Allergies:    Allergies  Allergen Reactions   Penicillins Other (See Comments)    Pt states he was told he was allergic by his mother, unknown reaction.    Social History:   Social History   Socioeconomic History   Marital status: Single    Spouse name: Not on file   Number of children: 0   Years of education: Not on file   Highest education level: Bachelor's degree (e.g., BA, AB, BS)  Occupational History   Occupation: IT Support  Tobacco Use   Smoking status: Every Day    Current packs/day: 0.50    Average packs/day: 0.5 packs/day for 39.0 years (19.5 ttl pk-yrs)    Types: Cigarettes    Start date: 02/04/1984   Smokeless tobacco: Never  Vaping Use   Vaping  status: Never Used  Substance and Sexual Activity   Alcohol use: Yes    Comment: socially   Drug use: Yes    Types: Marijuana    Comment: 3 x per week   Sexual activity: Yes    Birth control/protection: None  Other Topics Concern   Not on file  Social History Narrative   Marital status: divorced      Children: none      Employment: Scientist, product/process development; works from home      Tobacco: 1 ppd      Alcohol:  3 ounces on weekends      Drugs:  None   Social Drivers of Corporate investment banker Strain: Low Risk  (02/04/2023)   Overall Physicist, medical Strain (  CARDIA)    Difficulty of Paying Living Expenses: Not very hard  Food Insecurity: No Food Insecurity (02/01/2023)   Hunger Vital Sign    Worried About Running Out of Food in the Last Year: Never true    Ran Out of Food in the Last Year: Never true  Transportation Needs: No Transportation Needs (02/04/2023)   PRAPARE - Administrator, Civil Service (Medical): No    Lack of Transportation (Non-Medical): No  Physical Activity: Not on file  Stress: Not on file  Social Connections: Not on file  Intimate Partner Violence: Not At Risk (02/01/2023)   Humiliation, Afraid, Rape, and Kick questionnaire    Fear of Current or Ex-Partner: No    Emotionally Abused: No    Physically Abused: No    Sexually Abused: No    Family History:   Family History  Problem Relation Age of Onset   Cancer Mother        Breast cancer   Diabetes Maternal Grandmother      ROS:  Please see the history of present illness.  Review of Systems  Constitutional:  Negative for fever.  HENT:  Positive for congestion.   Respiratory:  Positive for shortness of breath.   Cardiovascular:  Negative for chest pain, palpitations, orthopnea, leg swelling and PND.  Gastrointestinal:  Negative for blood in stool, melena, nausea and vomiting.  Genitourinary:  Negative for hematuria.  Musculoskeletal:  Negative for myalgias.  Neurological:  Negative for  dizziness and loss of consciousness.  Endo/Heme/Allergies:  Does not bruise/bleed easily.  Psychiatric/Behavioral:  Positive for substance abuse.    Physical Exam/Data:   Vitals:   02/03/23 1659 02/03/23 1928 02/04/23 0336 02/04/23 0813  BP: 123/84 95/68 (!) 128/95 133/85  Pulse: 96 93 89 91  Resp: 18 18 18 20   Temp:  97.6 F (36.4 C) 98 F (36.7 C) 98.5 F (36.9 C)  TempSrc:  Oral Oral Oral  SpO2: 100% 100% 100% 93%  Weight:   61 kg   Height:        Intake/Output Summary (Last 24 hours) at 02/04/2023 1430 Last data filed at 02/04/2023 0846 Gross per 24 hour  Intake 120 ml  Output --  Net 120 ml      02/04/2023    3:36 AM 02/03/2023    3:36 AM 02/02/2023    3:00 AM  Last 3 Weights  Weight (lbs) 134 lb 7.7 oz 136 lb 7.4 oz 139 lb 12.4 oz  Weight (kg) 61 kg 61.9 kg 63.4 kg     Body mass index is 19.86 kg/m.  General: 57 y.o. thin African-American male resting comfortably in no acute distress. HEENT: Normocephalic and atraumatic. Sclera clear. EOMs intact. Neck: Supple. No significant JVD noted with patient sitting at 90 degrees. Heart: RRR. Distinct S1 and S2. No murmurs, gallops, or rubs. Radial and distal pedal pulses 2+ and equal bilaterally. Lungs: No increased work of breathing. Clear to ausculation bilaterally. No wheezes, rhonchi, or rales.  Extremities: No lower extremity edema.    Skin: Warm and dry. Neuro: Alert and oriented x3. No focal deficits. Psych: Normal affect. Responds appropriately.   EKG:  The EKG was personally reviewed and demonstrates:  Ainus tachycardia, rate 114 bpm, with severe LVH and associated repolarization changes.  Telemetry:  Telemetry was personally reviewed and demonstrates:  Not currently on telemetry.  Relevant CV Studies:  Echocardiogram 02/03/2023: Impressions:  1. Severe MR (possible etiology malcoaptation from dilated LV).   2. Left ventricular  ejection fraction, by estimation, is 20 to 25%. The  left ventricle has  severely decreased function. The left ventricle  demonstrates global hypokinesis. The left ventricular internal cavity size  was severely dilated. There is mild  left ventricular hypertrophy. Left ventricular diastolic parameters are  consistent with Grade III diastolic dysfunction (restrictive).   3. Right ventricular systolic function is normal. The right ventricular  size is normal. There is severely elevated pulmonary artery systolic  pressure.   4. Left atrial size was severely dilated.   5. Right atrial size was moderately dilated.   6. PISA 0.7 cm. The mitral valve is normal in structure. Severe mitral  valve regurgitation. No evidence of mitral stenosis.   7. Tricuspid valve regurgitation is moderate.   8. The aortic valve is normal in structure. Aortic valve regurgitation is  mild. No aortic stenosis is present.   9. The inferior vena cava is dilated in size with <50% respiratory  variability, suggesting right atrial pressure of 15 mmHg.   Comparison(s): No significant change from prior study. Prior images  reviewed side by side.    Laboratory Data:  High Sensitivity Troponin:   Recent Labs  Lab 02/01/23 1119 02/01/23 1319  TROPONINIHS 105* 90*     Chemistry Recent Labs  Lab 02/01/23 2304 02/02/23 0609 02/03/23 0737 02/04/23 0622  NA  --  141 139 137  K  --  4.4 4.3 4.2  CL  --  108 105 102  CO2  --  23 23 26   GLUCOSE  --  89 94 99  BUN  --  23* 26* 32*  CREATININE  --  1.98* 2.24* 2.54*  CALCIUM  --  8.7* 9.0 8.7*  MG 1.9 1.9  --   --   GFRNONAA  --  38* 33* 28*  ANIONGAP  --  10 11 9     Recent Labs  Lab 01/28/23 1451 02/02/23 0609 02/03/23 0737  PROT 5.8* 5.4* 5.5*  ALBUMIN 3.1* 2.9* 3.0*  AST 18 26 36  ALT 13 18 25   ALKPHOS 58 69 71  BILITOT 0.7 1.1 1.3*   Lipids No results for input(s): "CHOL", "TRIG", "HDL", "LABVLDL", "LDLCALC", "CHOLHDL" in the last 168 hours.  Hematology Recent Labs  Lab 02/01/23 1119 02/01/23 2304 02/02/23 0609  02/02/23 0844 02/03/23 0737  WBC 7.0  --  9.1  --  10.0  RBC 3.28* 3.71* 3.48*  --  4.54  HGB 6.3* 7.4* 7.1* 7.1* 9.4*  HCT 22.9* 26.4* 24.1* 25.3* 32.1*  MCV 69.8*  --  69.3*  --  70.7*  MCH 19.2*  --  20.4*  --  20.7*  MCHC 27.5*  --  29.5*  --  29.3*  RDW 19.5*  --  20.4*  --  21.5*  PLT 287  --  254  --  288   Thyroid No results for input(s): "TSH", "FREET4" in the last 168 hours.  BNP Recent Labs  Lab 01/28/23 1451 02/01/23 1253 02/03/23 0737  BNP 1,106.2* 879.5* 2,756.5*    DDimer  Recent Labs  Lab 02/01/23 1253  DDIMER 1.18*     Radiology/Studies:  ECHOCARDIOGRAM COMPLETE Result Date: 02/03/2023    ECHOCARDIOGRAM REPORT   Patient Name:   Hollan Sanangelo. Date of Exam: 02/03/2023 Medical Rec #:  [id number]        Height:       69.0 in Accession #:    [id number]       Weight:       136.5  lb Date of Birth:  19-Jan-1966        BSA:          1.756 m Patient Age:    57 years         BP:           130/95 mmHg Patient Gender: M                HR:           100 bpm. Exam Location:  Inpatient Procedure: 2D Echo, Color Doppler and Cardiac Doppler Indications:    elevated troponin  History:        Patient has prior history of Echocardiogram examinations, most                 recent 03/25/2017. Cardiomyopathy, chronic kidney disease,                 Signs/Symptoms:Shortness of Breath; Risk Factors:Current Smoker                 and Hypertension.  Sonographer:    Delcie Roch RDCS Referring Phys: [id number] JUSTIN B HOWERTER IMPRESSIONS  1. Severe MR (possible etiology malcoaptation from dilated LV).  2. Left ventricular ejection fraction, by estimation, is 20 to 25%. The left ventricle has severely decreased function. The left ventricle demonstrates global hypokinesis. The left ventricular internal cavity size was severely dilated. There is mild left ventricular hypertrophy. Left ventricular diastolic parameters are consistent with Grade III diastolic dysfunction (restrictive).  3. Right  ventricular systolic function is normal. The right ventricular size is normal. There is severely elevated pulmonary artery systolic pressure.  4. Left atrial size was severely dilated.  5. Right atrial size was moderately dilated.  6. PISA 0.7 cm. The mitral valve is normal in structure. Severe mitral valve regurgitation. No evidence of mitral stenosis.  7. Tricuspid valve regurgitation is moderate.  8. The aortic valve is normal in structure. Aortic valve regurgitation is mild. No aortic stenosis is present.  9. The inferior vena cava is dilated in size with <50% respiratory variability, suggesting right atrial pressure of 15 mmHg. Comparison(s): No significant change from prior study. Prior images reviewed side by side. FINDINGS  Left Ventricle: Left ventricular ejection fraction, by estimation, is 20 to 25%. The left ventricle has severely decreased function. The left ventricle demonstrates global hypokinesis. The left ventricular internal cavity size was severely dilated. There is mild left ventricular hypertrophy. Left ventricular diastolic parameters are consistent with Grade III diastolic dysfunction (restrictive). Right Ventricle: The right ventricular size is normal. No increase in right ventricular wall thickness. Right ventricular systolic function is normal. There is severely elevated pulmonary artery systolic pressure. The tricuspid regurgitant velocity is 3.60 m/s, and with an assumed right atrial pressure of 10 mmHg, the estimated right ventricular systolic pressure is 61.8 mmHg. Left Atrium: Left atrial size was severely dilated. Right Atrium: Right atrial size was moderately dilated. Pericardium: There is no evidence of pericardial effusion. Mitral Valve: PISA 0.7 cm. The mitral valve is normal in structure. Severe mitral valve regurgitation. No evidence of mitral valve stenosis. Tricuspid Valve: The tricuspid valve is normal in structure. Tricuspid valve regurgitation is moderate . No evidence of  tricuspid stenosis. Aortic Valve: The aortic valve is normal in structure. Aortic valve regurgitation is mild. No aortic stenosis is present. Pulmonic Valve: The pulmonic valve was normal in structure. Pulmonic valve regurgitation is mild. No evidence of pulmonic stenosis. Aorta: The aortic root is normal in size  and structure. Venous: The inferior vena cava is dilated in size with less than 50% respiratory variability, suggesting right atrial pressure of 15 mmHg. IAS/Shunts: No atrial level shunt detected by color flow Doppler. Additional Comments: Severe MR (possible etiology malcoaptation from dilated LV).  LEFT VENTRICLE PLAX 2D LVIDd:         7.60 cm      Diastology LVIDs:         6.70 cm      LV e' medial:    6.64 cm/s LV PW:         1.20 cm      LV E/e' medial:  22.7 LV IVS:        1.10 cm      LV e' lateral:   9.25 cm/s LVOT diam:     2.10 cm      LV E/e' lateral: 16.3 LV SV:         33 LV SV Index:   19 LVOT Area:     3.46 cm  LV Volumes (MOD) LV vol d, MOD A2C: 226.0 ml LV vol s, MOD A2C: 157.0 ml LV SV MOD A2C:     69.0 ml RIGHT VENTRICLE             IVC RV Basal diam:  3.40 cm     IVC diam: 2.50 cm RV S prime:     10.20 cm/s TAPSE (M-mode): 1.8 cm LEFT ATRIUM           Index        RIGHT ATRIUM           Index LA diam:      5.00 cm 2.85 cm/m   RA Area:     21.30 cm LA Vol (A4C): 91.5 ml 52.10 ml/m  RA Volume:   71.00 ml  40.43 ml/m  AORTIC VALVE LVOT Vmax:   76.40 cm/s LVOT Vmean:  46.600 cm/s LVOT VTI:    0.094 m  AORTA Ao Root diam: 3.70 cm Ao Asc diam:  3.60 cm MITRAL VALVE                  TRICUSPID VALVE MV Area (PHT): 6.96 cm       TR Peak grad:   51.8 mmHg MV Decel Time: 109 msec       TR Vmax:        360.00 cm/s MR Peak grad:    104.9 mmHg MR Mean grad:    65.0 mmHg    SHUNTS MR Vmax:         512.00 cm/s  Systemic VTI:  0.09 m MR Vmean:        371.0 cm/s   Systemic Diam: 2.10 cm MR PISA:         3.08 cm MR PISA Eff ROA: 23 mm MR PISA Radius:  0.70 cm MV E velocity: 151.00 cm/s MV A  velocity: 77.10 cm/s MV E/A ratio:  1.96 Donato Schultz MD Electronically signed by Donato Schultz MD Signature Date/Time: 02/03/2023/11:56:49 AM    Final    CT Angio Chest PE W/Cm &/Or Wo Cm Result Date: 02/01/2023 CLINICAL DATA:  Chest pain and shortness of breath. Elevated D-dimer. EXAM: CT ANGIOGRAPHY CHEST WITH CONTRAST TECHNIQUE: Multidetector CT imaging of the chest was performed using the standard protocol during bolus administration of intravenous contrast. Multiplanar CT image reconstructions and MIPs were obtained to evaluate the vascular anatomy. RADIATION DOSE REDUCTION: This exam was performed according to the departmental dose-optimization  program which includes automated exposure control, adjustment of the mA and/or kV according to patient size and/or use of iterative reconstruction technique. CONTRAST:  60mL OMNIPAQUE IOHEXOL 350 MG/ML SOLN COMPARISON:  None Available. FINDINGS: Cardiovascular: Satisfactory opacification of pulmonary arteries noted, and no pulmonary emboli identified. No evidence of thoracic aortic aneurysm. Aortic and coronary atherosclerotic calcification incidentally noted. Mild cardiomegaly. Mediastinum/Nodes: No masses or pathologically enlarged lymph nodes identified. Lungs/Pleura: Mild-to-moderate centrilobular emphysema. Mild right upper lobe scarring. No pulmonary mass or infiltrate. Tiny loculated pleural fluid collection noted in the right minor fissure. Upper abdomen: No acute findings. Musculoskeletal: No suspicious bone lesions identified. Review of the MIP images confirms the above findings. IMPRESSION: No evidence of pulmonary embolism. Mild cardiomegaly. Tiny loculated pleural fluid collection in right minor fissure. Aortic Atherosclerosis (ICD10-I70.0) and Emphysema (ICD10-J43.9). Electronically Signed   By: Danae Orleans M.D.   On: 02/01/2023 18:43   DG Chest 2 View Result Date: 02/01/2023 CLINICAL DATA:  Chest pain.  Shortness of breath. EXAM: CHEST - 2 VIEW  COMPARISON:  01/28/2023. FINDINGS: Bilateral lung fields are clear. Bilateral costophrenic angles are clear. Stable cardio-mediastinal silhouette. No acute osseous abnormalities. The soft tissues are within normal limits. IMPRESSION: No active cardiopulmonary disease. Electronically Signed   By: Jules Schick M.D.   On: 02/01/2023 12:04     Assessment and Plan:   Non-Ischemic Cardiomyopathy Chronic Combined CHF Patient has a long history of cardiomyopathy back to 2014.  EF at that time 27%. Myoview was negative for ischemia.  EF has fluctuated over the years based on medication compliance.  Most recent Echo in 03/2017 showed LVEF of 45-50%.  He has been lost to follow-up since 2020.  He now presents with dyspnea on exertion was found to be severely anemic with hemoglobin of 6.3.  However, work-up also revealed a severe drop in his EF.  Echo showed LVEF of 20-25% with global hypokinesis and mild LVH, and grade 3 diastolic dysfunction as well as normal RV size and function with severely elevated PASP, biatrial enlargement (left > right), and severe MR (possible etiology malcoaptation from dilated LV).  He has received 3 doses of IV Lasix.  No documented urinary output.  Weight down 5 pounds since admission.  Renal function has worsened with diuresis to this was stopped today. - Does not look significantly volume overloaded on exam. - Will discuss additional diuresis with MD. - Continue Coreg 6.25mg  twice daily. - Unable to add any additional GDMT right now due to renal function and soft BP. - Monitor daily weights, strict I/Os, and renal function.  - Will plan for RHC tomorrow to help guide additional diuresis. - Will also ask [facility] team to see while herer. - Etiology unclear. Possible due to alcohol abuse and discussed the importance of cessation. He has never had definitive evaluation of his coronaries but Myoview in 2014 was negative. He is not currently a good cardiac catheterization  candidate given anemia and renal function. Would try to optimize GDMT and then repeat Echo in a couple of months. If EF does not improve, will need repeat ischemic evaluation (cardiac cath vs. cardiac PET).  Severe Mitral Regurgitation Noted on Echo this admission. - Suspect secondary to severe LV dilatation.  - Would plan to optimize GDMT as above and then repeat Echo in a couple of months. If no improvement in MR, will likely need Structural Heart Evaluation.   Hypertension BP initially mildly elevated in the 130s/ 90s but now soft at times with systolic  BP in the 90s. - Continue medications for CHF as above.  CKD Stage IIIb Creatinine 1.89 on admission. Last known baseline from 2019 was around 1.4 to 1.5.  - Creatinine peaked at 2.54 today after diuresis. - Continue to monitor closely.   Symptomatic Anemia Iron Deficiency Anemia Hemoglobin 6.3 on admission. Last known hemoglobin 12.4 in 2018. Hemoccult negative. Iron low at 22 and ferritin low at 9. TIBC elevated. Folate and Vitamin B12 normal. Retic count normal. S/p 2 units of PRBCs.  - Hemoglobin 7.1 today. Goal >8.0 given CHF. - Plan right now is for outpatient GI outpatient per primary team notes.  However, would consider inpatient evaluation. Also would consider giving iron. Will defer to primary team.  Alcohol Abuse Patient reports drinking a Fifth of Bourbon every 1.5 weeks. He denies any history of alcohol withdrawal.  - Discussed importance of complete cessation.  Tobacco Abuse Patient has around a 40 year smoking history and currently smoke 1 pack every 3-4 days. - Discussed importance of complete cessation.   Risk Assessment/Risk Scores:    New York Heart Association (NYHA) Functional Class NYHA Class II   For questions or updates, please contact [facility] Please consult [email or web address] for contact info under    Signed, Corrin Parker, PA-C  02/04/2023 2:30 PM   ATTENDING  ATTESTATION:  After conducting a review of all available clinical information with the care team, interviewing the patient, and performing a physical exam, I agree with the findings and plan described in this note.   GEN: No acute distress.   HEENT:  MMM, JVD ~8cm, no scleral icterus Cardiac: RRR, no murmurs, rubs, or gallops.  Respiratory: Clear to auscultation bilaterally. GI: Soft, nontender, non-distended  MS: No edema; No deformity. Neuro:  Nonfocal  Vasc:  +2 radial pulses  The patient is a very pleasant 57 year old African-American male with a history of nonischemic cardiomyopathy with previous EF of 20 to 25% which improved to nearly 50% on TTE in 2019, hypertension, CKD stage IIIb who is here with acute on chronic systolic heart failure.  The patient has been off of his medications for some time.  He tells me that he could not afford the medications.  Most recently he developed some increasing shortness of breath.  This ultimately led to an admission here at [facility].  His hemoglobin was found to be 6.3 and he received 2 units of packed red blood cells.  Echocardiogram demonstrated an ejection fraction of 20 to 25% with left ventricular hypertrophy and severe functional mitral regurgitation.  He also had evidence of biatrial enlargement.  He was given Lasix however his creatinine has increased to 2.5.  He believes the shortness is improved but not entirely better.  He endorses early satiety.  He denies any chest pain.  I am concerned about his ongoing anemia which has not adequately responded to transfusion.  It is not exactly clear to me whether the patient remains volume overloaded or not.  His JVP does not look to be severely elevated.  However he reports abdominal fullness.  I will refer him for right heart catheterization without coronary angiography to definitively evaluate his volume status, cardiac output and index.  He is warm and well-perfused so I do not think he is in  cardiogenic shock.  If he is in a low output state then inotrope aided diuresis can be helpful.  His blood pressure is also somewhat marginal though acceptable for this degree of LV impairment though it  limits our ability to pursue GDMT.  We will have our advanced heart failure colleagues see the patient as well.  Further evaluation of the patient's anemia should be pursued during this admission.  I suspect given the patient's longstanding alcohol history that this is continuing to his cardiomyopathy along with medical noncompliance.  His medications have been too costly for him.  Alverda Skeans, MD Pager [phone]

## 2023-02-04 NOTE — Plan of Care (Signed)

## 2023-02-04 NOTE — Progress Notes (Signed)
   Heart Failure Stewardship Pharmacist Progress Note   PCP: Patient, No Pcp Per PCP-Cardiologist: Rollene Rotunda, MD    HPI:  57 yo M with PMH of CHF, CKD IIIb. Has been several years since he saw medical providers.   Presented to the ED on 12/13 with chest pain and shortness of breath. Was seen in urgent care on 12/9 for the same. Reported taking aspirin 650 mg PRN as a decongestant. Hgb 6.3 on arrival. CXR with no active cardiopulmonary disease. CTA negative for PE, mild cardiomegaly. BNP elevated 2756.5. ECHO 12/15 with LVEF 20-25% (was 45-50% in 2019 and 20-25% in 2018, 27% in 2014), global hypokinesis, mild LVH, G3DD, RV normal, severely elevated PA pressure, severe MR, moderate TR, mild AR.   Denies shortness of breath and states he has not had LE edema in about 5 years. States that he was on Entresto in the past and was feeling great. He stopped this a while ago due to cost. Reported that he had a copay card for about a year but then the cost went up to $300 again and couldn't afford it. Discussed copay options with [facility] and he is hopeful he can get restarted on this again.    Current HF Medications: Diuretic: furosemide 40 mg IV daily Beta Blocker: carvedilol 6.25 mg BID  Prior to admission HF Medications: None  Pertinent Lab Values: Serum creatinine 2.54, BUN 32, Potassium 4.2, Sodium 137, BNP 2756.5, Magnesium 1.9  Vital Signs: Weight: 134 lbs (admission weight: 139 lbs) Blood pressure: 130/80s  Heart rate: 90s  I/O: incomplete  Medication Assistance / Insurance Benefits Check: Does the patient have prescription insurance?  Yes Type of insurance plan: Financial risk analyst  Outpatient Pharmacy:  Prior to admission [facility] Is the patient willing to use [facility] TOC pharmacy at discharge? Yes Is the patient willing to transition their outpatient pharmacy to utilize a [facility] outpatient pharmacy?   No    Assessment: 1. Acute on chronic  systolic CHF (LVEF 20-25%), due to NICM. NYHA class II symptoms. - On furosemide 40 mg IV daily. Does not appear grossly volume overloaded on exam. Creatinine also increased from 2.24 to 2.54. May be able to transition to PO. - Continue carvedilol 6.25 mg BID - Consider restarting Entresto 24/26 mg BID tomorrow if creatinine improved - Consider Farxiga and spironolactone pending improvement in renal function. eGFR <30. - Advised to stop taking aspirin 650 mg PRN for congestion. Will discuss with cardiology about needing daily aspirin 81 mg or not needing aspirin at all.   Plan: 1) Medication changes recommended at this time: - Stop IV lasix - Start Entresto 24/26 mg BID tomorrow if creatinine improved  2) Patient assistance: - Can reactive copay card for Entresto - I'm wondering if a new prescription was sent in for the [facility] and the copay card was not linked to the new script. Sherryll Burger copay [phone] - copay card lowers to $10 - Farxiga copay [phone] - copay card lowers to $0  3)  Education  - Initial education completed - Full education to be completed prior to discharge  Sharen Hones, PharmD, BCPS Heart Failure Stewardship Pharmacist Phone [phone]

## 2023-02-04 NOTE — Progress Notes (Signed)
PROGRESS NOTE    [name]  WUJ:[id number] DOB: [birth year] DOA: 02/01/2023 PCP: Patient, No Pcp Per  Subjective: Pt seen and examined. Pt dressed in street clothes. Standing up and moving around in his room. He looks remarkably better. No dyspnea. Did not urinate as much yesterday with 40 MG IV lasix as he did prior to his PRBC transfusion.  Pt aware that his echo showed decreased LVEF of 20-25%. Similar to his echo in 08-2016 when he was first diagnosed with NICM.  Cardiology consult pending.  Scr increased to 2.54 after IV lasix yesterday. Scr was 2.24 prior to IV lasix.  Scr 1.41 in 2014. No other prior lab work.     Hospital Course: HPI:  Devynn Chess. is a 57 y.o. male with medical history significant for chronic systolic/diastolic heart failure with most recent LVEF 45 to 50%, stage IIIb CKD associated baseline creatinine range 1.4-1.9, who is admitted to [facility] on 02/01/2023 with symptomatic anemia after presenting from home to [facility] ED complaining of shortness of breath.    The patient reports 4 to 5 days of intermittent shortness of breath, worse with exertion, in the absence of any orthopnea, PND, or worsening of edema in the lower extremities.  Denies any associated subjective fever, chills, rigors, or generalized myalgias.  No recent cough, hemoptysis, new lower extremity erythema or new calf tenderness.  No wheezing.    He also reports 2 episodes of sharp, nonradiating left-sided chest discomfort over the last day.  But these episodes have occurred at rest at did not intensify with exertion.  Both episodes lasted for less than 10 minutes before spontaneously resolving in the absence of any nitroglycerin.  First episode occurred while at rest on the morning of 02/01/2023, with the second such episode occurring while at rest at work.  Denies any associated palpitations, diaphoresis, nausea, vomiting, dizziness, presyncope, or syncope.   He conveys that it  has been several years since he has previously seen a physician.  Most recent prior hemoglobin was found to be 12.4 in July 2018.  Not on any blood thinners as an outpatient.  Denies any recent abdominal pain prn or any recent melena or hematochezia.  No recent hematemesis or gross hematuria.  Significant Events: Admitted 02/01/2023 for symptomatic anemia   Significant Labs: Admission HgB 6.3, MCV 69.8, BUN 14, Scr 1.8 BNP 879 Iron 22(low), TIBC 486(high), %sat 5(low), ferritin 9(low) B12 304(nl) Folate 11.4(nl)  Significant Imaging Studies: Admission CTPA negative for PE  Antibiotic Therapy: Anti-infectives (From admission, onward)    None       Procedures:   Consultants:     Assessment and Plan: * Symptomatic anemia 02-02-2023 transfused with 1 unit PRBC last night. Labs reveal iron deficiency. He will need outpatient GI evaluation at least colonoscopy. Check UA to ensure no hematuria. Repeat HgB 7.1 this AM. Will give 1 more unit or PRBC today. Give 40 mg IV lasix prior to transfusion.  02-03-2023 HgB 9.4 g/dl after 2 unit PRBC transfusion. Outpatient GI evaluation.  02-04-2023 will refer to outpatient GI for evaluation.  Chronic combined systolic and diastolic heart failure (HCC) 02-02-2023 prior echo by Dr. 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 Awaiting repeat echo. Start coreg due to elevated HR and BP.  Don't this he is in acute CHF. No peripheral edema. No rales. RA sats 100%.  Hold on entresto for now.  02-03-2023 awaiting echo. Increase coreg to 6.25 mg bid.  02-04-2023 echo shows  LVEF 20-25%. Similar to echo in 2018. In 2019,(while on GDMT) his LVEF had improved to 45-50%. Pt stopped taking CHF meds due to cost in 2019-2020 and lost to f/u.  Will need CHF consult today. Pt is euvolemic. IV lasix yesterday did not produce much of a diuresis. Scr is up to 2.54 now.  Doubt his renal function will tolerate Entresto at this time.  He may need close f/u with CHF  clinic for med titration. HR improved with coreg 6.25 mg bid  Iron deficiency anemia 02-02-2023 labs shows iron deficiency. He will need temporary po iron therapy at discharge and outpatient referral to GI for at least colonoscopy. Hemoccult negative at time of admission.  Iron/TIBC/Ferritin/ %Sat    Component Value Date/Time   IRON 22 (L) 02/01/2023 2304   TIBC 486 (H) 02/01/2023 2304   FERRITIN 9 (L) 02/01/2023 2304   IRONPCTSAT 5 (L) 02/01/2023 2304     Elevated troponin 02-02-2023 admission EKG shows LVH. See "atypical CP". Awaiting echo  02-03-2023 awaiting echo  02-04-2023 echo shows LVEF 20-25%. Cards consult pending.  Atypical chest pain 02-02-2023 troponin elevated at admission but flat. Awaiting echo. Admission EKG shows LVH.    CKD stage 3b, GFR 30-44 ml/min (HCC) 02-02-2023 last Scr was 04-2017 of 1.42. monitor Scr. Will need referral to outpatient nephrology. Check spot protein/creatinine ratio. UA today shows no protein in his urine.  02-03-2023 awaiting this AM CMP. Yesterday urine protein/urine Scr ratio was normal. No proteinuria. Spot Urine Protein: Urine Creatinine Ratio Lab Results  Component Value Date/Time   LABCREA 183 02/02/2023 08:15 AM   TOTPROTUR 21 02/02/2023 08:15 AM   PROTCRRATIO 0.11 02/02/2023 08:15 AM     Essential hypertension 02-02-2023 start coreg 3.125 mg bid.  02-03-2023 increase coreg to 6.25 mg bid.  02-04-2023 BP and HR stable on coreg 6.25 mg bid.   DVT prophylaxis: SCDs Start: 02/01/23 1933    Code Status: Full Code Family Communication: no family at bedside Disposition Plan: return home Reason for continuing need for hospitalization: awaiting cardiology consult.  Objective: Vitals:   02/03/23 1659 02/03/23 1928 02/04/23 0336 02/04/23 0813  BP: 123/84 95/68 (!) 128/95 133/85  Pulse: 96 93 89 91  Resp: 18 18 18 20   Temp:  97.6 F (36.4 C) 98 F (36.7 C) 98.5 F (36.9 C)  TempSrc:  Oral Oral Oral  SpO2: 100%  100% 100% 93%  Weight:   61 kg   Height:        Intake/Output Summary (Last 24 hours) at 02/04/2023 1018 Last data filed at 02/04/2023 0846 Gross per 24 hour  Intake 120 ml  Output --  Net 120 ml   Filed Weights   02/02/23 0300 02/03/23 0336 02/04/23 0336  Weight: 63.4 kg 61.9 kg 61 kg    Examination:  Physical Exam Vitals and nursing note reviewed.  Constitutional:      General: He is not in acute distress.    Appearance: He is normal weight. He is not toxic-appearing or diaphoretic.  HENT:     Head: Normocephalic and atraumatic.  Eyes:     General: No scleral icterus. Cardiovascular:     Rate and Rhythm: Normal rate and regular rhythm.     Heart sounds: Murmur heard.  Pulmonary:     Effort: Pulmonary effort is normal. No respiratory distress.     Breath sounds: Normal breath sounds. No wheezing or rales.  Abdominal:     General: Abdomen is flat. Bowel sounds are normal.  Palpations: Abdomen is soft.  Musculoskeletal:     Right lower leg: No edema.     Left lower leg: No edema.  Skin:    General: Skin is warm and dry.     Capillary Refill: Capillary refill takes less than 2 seconds.  Neurological:     Mental Status: He is alert and oriented to person, place, and time.    Data Reviewed: I have personally reviewed following labs and imaging studies  CBC: Recent Labs  Lab 02/01/23 1119 02/01/23 2304 02/02/23 0609 02/02/23 0844 02/03/23 0737  WBC 7.0  --  9.1  --  10.0  NEUTROABS  --   --  6.9  --   --   HGB 6.3* 7.4* 7.1* 7.1* 9.4*  HCT 22.9* 26.4* 24.1* 25.3* 32.1*  MCV 69.8*  --  69.3*  --  70.7*  PLT 287  --  254  --  288   Basic Metabolic Panel: Recent Labs  Lab 01/28/23 1451 02/01/23 1119 02/01/23 2304 02/02/23 0609 02/03/23 0737 02/04/23 0622  NA 138 141  --  141 139 137  K 4.2 4.2  --  4.4 4.3 4.2  CL 104 107  --  108 105 102  CO2 23 25  --  23 23 26   GLUCOSE 90 100*  --  89 94 99  BUN 22* 14  --  23* 26* 32*  CREATININE 1.89*  1.80*  --  1.98* 2.24* 2.54*  CALCIUM 8.7* 9.0  --  8.7* 9.0 8.7*  MG  --   --  1.9 1.9  --   --    GFR: Estimated Creatinine Clearance: 27 mL/min (A) (by C-G formula based on SCr of 2.54 mg/dL (H)). Liver Function Tests: Recent Labs  Lab 01/28/23 1451 02/02/23 0609 02/03/23 0737  AST 18 26 36  ALT 13 18 25   ALKPHOS 58 69 71  BILITOT 0.7 1.1 1.3*  PROT 5.8* 5.4* 5.5*  ALBUMIN 3.1* 2.9* 3.0*   Recent Labs  Lab 02/02/23 0609  LIPASE 37    Coagulation Profile: Recent Labs  Lab 02/01/23 2304  INR 1.2   BNP (last 3 results) Recent Labs    01/28/23 1451 02/01/23 1253 02/03/23 0737  BNP 1,106.2* 879.5* 2,756.5*   Anemia Panel: Recent Labs    02/01/23 2304  VITAMINB12 304  FOLATE 11.4  FERRITIN 9*  TIBC 486*  IRON 22*  RETICCTPCT 0.9   Radiology Studies: ECHOCARDIOGRAM COMPLETE Result Date: 02/03/2023    ECHOCARDIOGRAM REPORT   Patient Name:   Yoav Socarras. Date of Exam: 02/03/2023 Medical Rec #:  [id number]        Height:       69.0 in Accession #:    [id number]       Weight:       136.5 lb Date of Birth:  [birth year]        BSA:          1.756 m Patient Age:    57 years         BP:           130/95 mmHg Patient Gender: M                HR:           100 bpm. Exam Location:  Inpatient Procedure: 2D Echo, Color Doppler and Cardiac Doppler Indications:    elevated troponin  History:        Patient has prior history of Echocardiogram examinations,  most                 recent 03/25/2017. Cardiomyopathy, chronic kidney disease,                 Signs/Symptoms:Shortness of Breath; Risk Factors:Current Smoker                 and Hypertension.  Sonographer:    Delcie Roch RDCS Referring Phys: [id number] JUSTIN B HOWERTER IMPRESSIONS  1. Severe MR (possible etiology malcoaptation from dilated LV).  2. Left ventricular ejection fraction, by estimation, is 20 to 25%. The left ventricle has severely decreased function. The left ventricle demonstrates global hypokinesis. The left  ventricular internal cavity size was severely dilated. There is mild left ventricular hypertrophy. Left ventricular diastolic parameters are consistent with Grade III diastolic dysfunction (restrictive).  3. Right ventricular systolic function is normal. The right ventricular size is normal. There is severely elevated pulmonary artery systolic pressure.  4. Left atrial size was severely dilated.  5. Right atrial size was moderately dilated.  6. PISA 0.7 cm. The mitral valve is normal in structure. Severe mitral valve regurgitation. No evidence of mitral stenosis.  7. Tricuspid valve regurgitation is moderate.  8. The aortic valve is normal in structure. Aortic valve regurgitation is mild. No aortic stenosis is present.  9. The inferior vena cava is dilated in size with <50% respiratory variability, suggesting right atrial pressure of 15 mmHg. Comparison(s): No significant change from prior study. Prior images reviewed side by side. FINDINGS  Left Ventricle: Left ventricular ejection fraction, by estimation, is 20 to 25%. The left ventricle has severely decreased function. The left ventricle demonstrates global hypokinesis. The left ventricular internal cavity size was severely dilated. There is mild left ventricular hypertrophy. Left ventricular diastolic parameters are consistent with Grade III diastolic dysfunction (restrictive). Right Ventricle: The right ventricular size is normal. No increase in right ventricular wall thickness. Right ventricular systolic function is normal. There is severely elevated pulmonary artery systolic pressure. The tricuspid regurgitant velocity is 3.60 m/s, and with an assumed right atrial pressure of 10 mmHg, the estimated right ventricular systolic pressure is 61.8 mmHg. Left Atrium: Left atrial size was severely dilated. Right Atrium: Right atrial size was moderately dilated. Pericardium: There is no evidence of pericardial effusion. Mitral Valve: PISA 0.7 cm. The mitral valve is  normal in structure. Severe mitral valve regurgitation. No evidence of mitral valve stenosis. Tricuspid Valve: The tricuspid valve is normal in structure. Tricuspid valve regurgitation is moderate . No evidence of tricuspid stenosis. Aortic Valve: The aortic valve is normal in structure. Aortic valve regurgitation is mild. No aortic stenosis is present. Pulmonic Valve: The pulmonic valve was normal in structure. Pulmonic valve regurgitation is mild. No evidence of pulmonic stenosis. Aorta: The aortic root is normal in size and structure. Venous: The inferior vena cava is dilated in size with less than 50% respiratory variability, suggesting right atrial pressure of 15 mmHg. IAS/Shunts: No atrial level shunt detected by color flow Doppler. Additional Comments: Severe MR (possible etiology malcoaptation from dilated LV).  LEFT VENTRICLE PLAX 2D LVIDd:         7.60 cm      Diastology LVIDs:         6.70 cm      LV e' medial:    6.64 cm/s LV PW:         1.20 cm      LV E/e' medial:  22.7 LV IVS:  1.10 cm      LV e' lateral:   9.25 cm/s LVOT diam:     2.10 cm      LV E/e' lateral: 16.3 LV SV:         33 LV SV Index:   19 LVOT Area:     3.46 cm  LV Volumes (MOD) LV vol d, MOD A2C: 226.0 ml LV vol s, MOD A2C: 157.0 ml LV SV MOD A2C:     69.0 ml RIGHT VENTRICLE             IVC RV Basal diam:  3.40 cm     IVC diam: 2.50 cm RV S prime:     10.20 cm/s TAPSE (M-mode): 1.8 cm LEFT ATRIUM           Index        RIGHT ATRIUM           Index LA diam:      5.00 cm 2.85 cm/m   RA Area:     21.30 cm LA Vol (A4C): 91.5 ml 52.10 ml/m  RA Volume:   71.00 ml  40.43 ml/m  AORTIC VALVE LVOT Vmax:   76.40 cm/s LVOT Vmean:  46.600 cm/s LVOT VTI:    0.094 m  AORTA Ao Root diam: 3.70 cm Ao Asc diam:  3.60 cm MITRAL VALVE                  TRICUSPID VALVE MV Area (PHT): 6.96 cm       TR Peak grad:   51.8 mmHg MV Decel Time: 109 msec       TR Vmax:        360.00 cm/s MR Peak grad:    104.9 mmHg MR Mean grad:    65.0 mmHg    SHUNTS MR  Vmax:         512.00 cm/s  Systemic VTI:  0.09 m MR Vmean:        371.0 cm/s   Systemic Diam: 2.10 cm MR PISA:         3.08 cm MR PISA Eff ROA: 23 mm MR PISA Radius:  0.70 cm MV E velocity: 151.00 cm/s MV A velocity: 77.10 cm/s MV E/A ratio:  1.96 Donato Schultz MD Electronically signed by Donato Schultz MD Signature Date/Time: 02/03/2023/11:56:49 AM    Final     Scheduled Meds:  carvedilol  6.25 mg Oral BID WC   Continuous Infusions:   LOS: 2 days   Time spent: 40 minutes  Carollee Herter, DO  [facility]  02/04/2023, 10:18 AM

## 2023-02-04 NOTE — Progress Notes (Signed)
Heart Failure Nurse Navigator Progress Note  PCP: Patient, No Pcp Per PCP-Cardiologist: None Admission Diagnosis: symptomatic anemia, elevated troponins Admitted from: Home  Presentation:   Norman Jones. presented with high blood pressure and sharp chest pain, shortness of breath, reported has not been to a doctor , nor taken any medications for a few years, has a 39 smoking history, currently smokes about 3/4 a pack per day. BP 136/92, HR 111, BNP 879, Troponin 105, Hgb 6.3, CXR with cardiomegaly,EKG with tachycardia, CTA negative for PE,   Patient was educated on the sign and symptoms of heart failure, daily weights, when to call his doctor or go to the ED, Diet/ fluid restrictions 9 reported to drinking multiple Cheer wine soda's daily, education on taking all medications as prescribed and attending all medical appointments. Patient verbalized his understanding, a HF TOC appointment was scheduled for 02/18/2023 @ 11 am.   ECHO/ LVEF: 20-25%  Clinical Course:  Past Medical History:  Diagnosis Date   Asthma    CHF (congestive heart failure) (HCC) 09/29/2012   Hypertension    Seasonal allergies      Social History   Socioeconomic History   Marital status: Single    Spouse name: Not on file   Number of children: Not on file   Years of education: Not on file   Highest education level: Not on file  Occupational History   Occupation: IT Support  Tobacco Use   Smoking status: Every Day    Current packs/day: 1.00    Types: Cigarettes   Smokeless tobacco: Never  Vaping Use   Vaping status: Never Used  Substance and Sexual Activity   Alcohol use: Yes    Comment: socially   Drug use: Yes    Types: Marijuana   Sexual activity: Yes    Birth control/protection: None  Other Topics Concern   Not on file  Social History Narrative   Marital status: divorced      Children: none      Employment: Scientist, product/process development; works from home      Tobacco: 1 ppd      Alcohol:  3 ounces on  weekends      Drugs:  None   Social Drivers of Corporate investment banker Strain: Not on file  Food Insecurity: No Food Insecurity (02/01/2023)   Hunger Vital Sign    Worried About Running Out of Food in the Last Year: Never true    Ran Out of Food in the Last Year: Never true  Transportation Needs: No Transportation Needs (02/01/2023)   PRAPARE - Administrator, Civil Service (Medical): No    Lack of Transportation (Non-Medical): No  Physical Activity: Not on file  Stress: Not on file  Social Connections: Not on file   Education Assessment and Provision:  Detailed education and instructions provided on heart failure disease management including the following:  Signs and symptoms of Heart Failure When to call the physician Importance of daily weights Low sodium diet Fluid restriction Medication management Anticipated future follow-up appointments  Patient education given on each of the above topics.  Patient acknowledges understanding via teach back method and acceptance of all instructions.  Education Materials:  "Living Better With Heart Failure" Booklet, HF zone tool, & Daily Weight Tracker Tool.  Patient has scale at home: Yes Patient has pill box at home: No, stated he would purchase one.     High Risk Criteria for Readmission and/or Poor Patient Outcomes: Heart failure  hospital admissions (last 6 months): 1  No Show rate: 8% Difficult social situation: No Demonstrates medication adherence: No, hasn't taken any in years.  Primary Language: English Literacy level: Reading, writing, and comprehension  Barriers of Care:   Medication/ appointment compliance Diet/ fluid restrictions ( soda)  Daily weights  Considerations/Referrals:   Referral made to Heart Failure Pharmacist Stewardship: yes Referral made to Heart Failure CSW/NCM TOC: No Referral made to Heart & Vascular TOC clinic: Yes, 02/18/2023 @ 11 am  Items for Follow-up on  DC/TOC: Medication/appointment compliance Diet/ fluid restrictions ( soda)  Daily weights Continued HF education   Rhae Hammock, BSN, RN Heart Failure Print production planner Chat Only

## 2023-02-04 NOTE — Consult Note (Addendum)
Cardiology Consultation   Patient ID: Norman Jones. MRN: [id number]; DOB: 26-Apr-1965  Admit date: 02/01/2023 Date of Consult: 02/04/2023  PCP:  Patient, No Pcp Per   [facility] Providers Cardiologist:  Rollene Rotunda, MD        Patient Profile:   Norman Jones. is a 57 y.o. male with a history of non-ischemic cardiomyopathy/ chronic HFmrEF with EF as low as 20-25% in 2018 bu improved to 45-50% on last Echo in 03/2017, hypertension, CKD stage IIIb, and asthma who is being seen 02/04/2023 for the evaluation of CHF and severe mitral regurgitation at the request of Dr. Imogene Burn.    History of Present Illness:   Norman Jones is a 57 year old male with the above history who is followed by Dr. Antoine Poche has not been seen since 2020.  He has a history of nonischemic cardiomyopathy dating back to 2014.  Echo at that time showed LVEF of 27% with severe global hypokinesis and grade 1 diastolic dysfunction mild MR.  Myoview was negative.  He subsequently did well but then lost his job and could not afford his medications.  He was in the ED in 08/2016 with CHF.  Echo showed LVEF of 20-25% with diffuse hypokinesis, mildly dilated RV with severe RV dysfunction, moderate to severe MR, and moderate TR.  He was restarted on GDMT and repeat echo in 03/2017 showed significant improvement of LVEF to 45-50% with diffuse hypokinesis and grade 1 diastolic dysfunction as well as mild to moderate MR.  He was last seen by Dr. Antoine Poche in 05/2018 for a virtual visit at which time he was doing well with no chest pain or new CHF symptoms.  He has been lost to follow-up since then.  He was seen at an Urgent Care on 01/28/2023 for evaluation of chest pain and shortness of breath that started at work.  He was noted to be hypertensive with BP of 136/92 and tachycardic.  KG showed sinus tachycardia, rate 115 bpm, with severe LVH with reseated repolarization changes.  BNP was elevated at 1, 106.  X-ray showed  cardiomegaly with interstitial prominence likely scarring.  He device to get reestablished with PCP and Cardiology.  Patient presented to the ED on 02/01/2023 for further evaluation of chest pain and shortness of breath.  Upon arrival to the ED, he was mildly hypertensive and tachycardic.  EKG again showed sinus tachycardia, rate 114 bpm, with severe LVH and associated repolarization changes.  High-sensitivity troponin elevated at 105 >> 90.  BNP 879.  D-dimer elevated at 1.18.  Chest x-ray showed no acute findings.  Chest CTA showed mild cardiomegaly but no evidence of PE. WBC 7.0, Hgb 6.3, Plts 287. Na 141, K 4.2, Glucose 100, BUN 14, Cr 1.80.  Hemoccult was negative.  He was admitted for diabetic anemia and has received 2 units of PRBCs.   Echo on 02/03/2023 showed LVEF of 20-25% with global hypokinesis and mild LVH, and grade 3 diastolic dysfunction as well as normal RV size and function with severely elevated PASP, biatrial enlargement (left > right), and severe MR (possible etiology malcoaptation from dilated LV).  He was started on IV Lasix and cardiology consulted for further evaluation.  At the time of this evaluation, patient is resting comfortably in no acute distress.  He states he has not seen a doctor been taking any medications for a couple of years.  He was previously on Entresto was doing very well on this but it became too expensive.  He  states he was in his usual state of health until 01/28/2023 when he started to notice some dyspnea on exertion while at work.  He works Consulting civil engineer at [employer] in [place] and noticed he was getting short of breath when walking from building to building.  This minded him of how he felt when he was originally diagnosed with CHF.  This is why he decided to go to the Urgent Care that day and symptoms continued which is what ultimately prompted him to come to the ED. There  mention some chest pain from  but patient denies this with me.  He describes a feeling like  he could not take a deep breath but no chest pain. No shortness of breath at rest. No orthopnea, PND, or edema. No weight gain. No palpitations, lightheadedness, dizziness, or syncope. He describes some nasal congestion which is not uncommon this time of year but no other URI symptoms. No GI symptoms. No abnormal bleeding in urine or stools.  He has a long smoking history. He has smoked for about 40 years and currently smokes 1 pack every 2-3 days. He also has significant alcohol use and reports drinking a Fifth of Bourbon every 1.5 weeks. He also endorses marijuana uses but no other drug use (no cocaine or amphetamines).   Past Medical History:  Diagnosis Date   Asthma    CHF (congestive heart failure) (HCC) 09/29/2012   Hypertension    Seasonal allergies     History reviewed. No pertinent surgical history.   Home Medications:  Prior to Admission medications   Medication Sig Start Date End Date Taking? Authorizing Provider  aspirin 325 MG tablet Take 650 mg by mouth daily as needed (decongestant).   Yes [provider]    Inpatient Medications: Scheduled Meds:  carvedilol  6.25 mg Oral BID WC   Continuous Infusions:  PRN Meds: acetaminophen **OR** acetaminophen, melatonin, ondansetron (ZOFRAN) IV  Allergies:    Allergies  Allergen Reactions   Penicillins Other (See Comments)    Pt states he was told he was allergic by his mother, unknown reaction.    Social History:   Social History   Socioeconomic History   Marital status: Single    Spouse name: Not on file   Number of children: 0   Years of education: Not on file   Highest education level: Bachelor's degree (e.g., BA, AB, BS)  Occupational History   Occupation: IT Support  Tobacco Use   Smoking status: Every Day    Current packs/day: 0.50    Average packs/day: 0.5 packs/day for 39.0 years (19.5 ttl pk-yrs)    Types: Cigarettes    Start date: 02/04/1984   Smokeless tobacco: Never  Vaping Use   Vaping  status: Never Used  Substance and Sexual Activity   Alcohol use: Yes    Comment: socially   Drug use: Yes    Types: Marijuana    Comment: 3 x per week   Sexual activity: Yes    Birth control/protection: None  Other Topics Concern   Not on file  Social History Narrative   Marital status: divorced      Children: none      Employment: Scientist, product/process development; works from home      Tobacco: 1 ppd      Alcohol:  3 ounces on weekends      Drugs:  None   Social Drivers of Corporate investment banker Strain: Low Risk  (02/04/2023)   Overall Physicist, medical Strain (  CARDIA)    Difficulty of Paying Living Expenses: Not very hard  Food Insecurity: No Food Insecurity (02/01/2023)   Hunger Vital Sign    Worried About Running Out of Food in the Last Year: Never true    Ran Out of Food in the Last Year: Never true  Transportation Needs: No Transportation Needs (02/04/2023)   PRAPARE - Administrator, Civil Service (Medical): No    Lack of Transportation (Non-Medical): No  Physical Activity: Not on file  Stress: Not on file  Social Connections: Not on file  Intimate Partner Violence: Not At Risk (02/01/2023)   Humiliation, Afraid, Rape, and Kick questionnaire    Fear of Current or Ex-Partner: No    Emotionally Abused: No    Physically Abused: No    Sexually Abused: No    Family History:   Family History  Problem Relation Age of Onset   Cancer Mother        Breast cancer   Diabetes Maternal Grandmother      ROS:  Please see the history of present illness.  Review of Systems  Constitutional:  Negative for fever.  HENT:  Positive for congestion.   Respiratory:  Positive for shortness of breath.   Cardiovascular:  Negative for chest pain, palpitations, orthopnea, leg swelling and PND.  Gastrointestinal:  Negative for blood in stool, melena, nausea and vomiting.  Genitourinary:  Negative for hematuria.  Musculoskeletal:  Negative for myalgias.  Neurological:  Negative for  dizziness and loss of consciousness.  Endo/Heme/Allergies:  Does not bruise/bleed easily.  Psychiatric/Behavioral:  Positive for substance abuse.    Physical Exam/Data:   Vitals:   02/03/23 1659 02/03/23 1928 02/04/23 0336 02/04/23 0813  BP: 123/84 95/68 (!) 128/95 133/85  Pulse: 96 93 89 91  Resp: 18 18 18 20   Temp:  97.6 F (36.4 C) 98 F (36.7 C) 98.5 F (36.9 C)  TempSrc:  Oral Oral Oral  SpO2: 100% 100% 100% 93%  Weight:   61 kg   Height:        Intake/Output Summary (Last 24 hours) at 02/04/2023 1430 Last data filed at 02/04/2023 0846 Gross per 24 hour  Intake 120 ml  Output --  Net 120 ml      02/04/2023    3:36 AM 02/03/2023    3:36 AM 02/02/2023    3:00 AM  Last 3 Weights  Weight (lbs) 134 lb 7.7 oz 136 lb 7.4 oz 139 lb 12.4 oz  Weight (kg) 61 kg 61.9 kg 63.4 kg     Body mass index is 19.86 kg/m.  General: 57 y.o. thin African-American male resting comfortably in no acute distress. HEENT: Normocephalic and atraumatic. Sclera clear. EOMs intact. Neck: Supple. No significant JVD noted with patient sitting at 90 degrees. Heart: RRR. Distinct S1 and S2. No murmurs, gallops, or rubs. Radial and distal pedal pulses 2+ and equal bilaterally. Lungs: No increased work of breathing. Clear to ausculation bilaterally. No wheezes, rhonchi, or rales.  Extremities: No lower extremity edema.    Skin: Warm and dry. Neuro: Alert and oriented x3. No focal deficits. Psych: Normal affect. Responds appropriately.   EKG:  The EKG was personally reviewed and demonstrates:  Ainus tachycardia, rate 114 bpm, with severe LVH and associated repolarization changes.  Telemetry:  Telemetry was personally reviewed and demonstrates:  Not currently on telemetry.  Relevant CV Studies:  Echocardiogram 02/03/2023: Impressions:  1. Severe MR (possible etiology malcoaptation from dilated LV).   2. Left ventricular  ejection fraction, by estimation, is 20 to 25%. The  left ventricle has  severely decreased function. The left ventricle  demonstrates global hypokinesis. The left ventricular internal cavity size  was severely dilated. There is mild  left ventricular hypertrophy. Left ventricular diastolic parameters are  consistent with Grade III diastolic dysfunction (restrictive).   3. Right ventricular systolic function is normal. The right ventricular  size is normal. There is severely elevated pulmonary artery systolic  pressure.   4. Left atrial size was severely dilated.   5. Right atrial size was moderately dilated.   6. PISA 0.7 cm. The mitral valve is normal in structure. Severe mitral  valve regurgitation. No evidence of mitral stenosis.   7. Tricuspid valve regurgitation is moderate.   8. The aortic valve is normal in structure. Aortic valve regurgitation is  mild. No aortic stenosis is present.   9. The inferior vena cava is dilated in size with <50% respiratory  variability, suggesting right atrial pressure of 15 mmHg.   Comparison(s): No significant change from prior study. Prior images  reviewed side by side.    Laboratory Data:  High Sensitivity Troponin:   Recent Labs  Lab 02/01/23 1119 02/01/23 1319  TROPONINIHS 105* 90*     Chemistry Recent Labs  Lab 02/01/23 2304 02/02/23 0609 02/03/23 0737 02/04/23 0622  NA  --  141 139 137  K  --  4.4 4.3 4.2  CL  --  108 105 102  CO2  --  23 23 26   GLUCOSE  --  89 94 99  BUN  --  23* 26* 32*  CREATININE  --  1.98* 2.24* 2.54*  CALCIUM  --  8.7* 9.0 8.7*  MG 1.9 1.9  --   --   GFRNONAA  --  38* 33* 28*  ANIONGAP  --  10 11 9     Recent Labs  Lab 01/28/23 1451 02/02/23 0609 02/03/23 0737  PROT 5.8* 5.4* 5.5*  ALBUMIN 3.1* 2.9* 3.0*  AST 18 26 36  ALT 13 18 25   ALKPHOS 58 69 71  BILITOT 0.7 1.1 1.3*   Lipids No results for input(s): "CHOL", "TRIG", "HDL", "LABVLDL", "LDLCALC", "CHOLHDL" in the last 168 hours.  Hematology Recent Labs  Lab 02/01/23 1119 02/01/23 2304 02/02/23 0609  02/02/23 0844 02/03/23 0737  WBC 7.0  --  9.1  --  10.0  RBC 3.28* 3.71* 3.48*  --  4.54  HGB 6.3* 7.4* 7.1* 7.1* 9.4*  HCT 22.9* 26.4* 24.1* 25.3* 32.1*  MCV 69.8*  --  69.3*  --  70.7*  MCH 19.2*  --  20.4*  --  20.7*  MCHC 27.5*  --  29.5*  --  29.3*  RDW 19.5*  --  20.4*  --  21.5*  PLT 287  --  254  --  288   Thyroid No results for input(s): "TSH", "FREET4" in the last 168 hours.  BNP Recent Labs  Lab 01/28/23 1451 02/01/23 1253 02/03/23 0737  BNP 1,106.2* 879.5* 2,756.5*    DDimer  Recent Labs  Lab 02/01/23 1253  DDIMER 1.18*     Radiology/Studies:  ECHOCARDIOGRAM COMPLETE Result Date: 02/03/2023    ECHOCARDIOGRAM REPORT   Patient Name:   Hollan Sanangelo. Date of Exam: 02/03/2023 Medical Rec #:  [id number]        Height:       69.0 in Accession #:    [id number]       Weight:       136.5  lb Date of Birth:  19-Jan-1966        BSA:          1.756 m Patient Age:    57 years         BP:           130/95 mmHg Patient Gender: M                HR:           100 bpm. Exam Location:  Inpatient Procedure: 2D Echo, Color Doppler and Cardiac Doppler Indications:    elevated troponin  History:        Patient has prior history of Echocardiogram examinations, most                 recent 03/25/2017. Cardiomyopathy, chronic kidney disease,                 Signs/Symptoms:Shortness of Breath; Risk Factors:Current Smoker                 and Hypertension.  Sonographer:    Delcie Roch RDCS Referring Phys: [id number] JUSTIN B HOWERTER IMPRESSIONS  1. Severe MR (possible etiology malcoaptation from dilated LV).  2. Left ventricular ejection fraction, by estimation, is 20 to 25%. The left ventricle has severely decreased function. The left ventricle demonstrates global hypokinesis. The left ventricular internal cavity size was severely dilated. There is mild left ventricular hypertrophy. Left ventricular diastolic parameters are consistent with Grade III diastolic dysfunction (restrictive).  3. Right  ventricular systolic function is normal. The right ventricular size is normal. There is severely elevated pulmonary artery systolic pressure.  4. Left atrial size was severely dilated.  5. Right atrial size was moderately dilated.  6. PISA 0.7 cm. The mitral valve is normal in structure. Severe mitral valve regurgitation. No evidence of mitral stenosis.  7. Tricuspid valve regurgitation is moderate.  8. The aortic valve is normal in structure. Aortic valve regurgitation is mild. No aortic stenosis is present.  9. The inferior vena cava is dilated in size with <50% respiratory variability, suggesting right atrial pressure of 15 mmHg. Comparison(s): No significant change from prior study. Prior images reviewed side by side. FINDINGS  Left Ventricle: Left ventricular ejection fraction, by estimation, is 20 to 25%. The left ventricle has severely decreased function. The left ventricle demonstrates global hypokinesis. The left ventricular internal cavity size was severely dilated. There is mild left ventricular hypertrophy. Left ventricular diastolic parameters are consistent with Grade III diastolic dysfunction (restrictive). Right Ventricle: The right ventricular size is normal. No increase in right ventricular wall thickness. Right ventricular systolic function is normal. There is severely elevated pulmonary artery systolic pressure. The tricuspid regurgitant velocity is 3.60 m/s, and with an assumed right atrial pressure of 10 mmHg, the estimated right ventricular systolic pressure is 61.8 mmHg. Left Atrium: Left atrial size was severely dilated. Right Atrium: Right atrial size was moderately dilated. Pericardium: There is no evidence of pericardial effusion. Mitral Valve: PISA 0.7 cm. The mitral valve is normal in structure. Severe mitral valve regurgitation. No evidence of mitral valve stenosis. Tricuspid Valve: The tricuspid valve is normal in structure. Tricuspid valve regurgitation is moderate . No evidence of  tricuspid stenosis. Aortic Valve: The aortic valve is normal in structure. Aortic valve regurgitation is mild. No aortic stenosis is present. Pulmonic Valve: The pulmonic valve was normal in structure. Pulmonic valve regurgitation is mild. No evidence of pulmonic stenosis. Aorta: The aortic root is normal in size  and structure. Venous: The inferior vena cava is dilated in size with less than 50% respiratory variability, suggesting right atrial pressure of 15 mmHg. IAS/Shunts: No atrial level shunt detected by color flow Doppler. Additional Comments: Severe MR (possible etiology malcoaptation from dilated LV).  LEFT VENTRICLE PLAX 2D LVIDd:         7.60 cm      Diastology LVIDs:         6.70 cm      LV e' medial:    6.64 cm/s LV PW:         1.20 cm      LV E/e' medial:  22.7 LV IVS:        1.10 cm      LV e' lateral:   9.25 cm/s LVOT diam:     2.10 cm      LV E/e' lateral: 16.3 LV SV:         33 LV SV Index:   19 LVOT Area:     3.46 cm  LV Volumes (MOD) LV vol d, MOD A2C: 226.0 ml LV vol s, MOD A2C: 157.0 ml LV SV MOD A2C:     69.0 ml RIGHT VENTRICLE             IVC RV Basal diam:  3.40 cm     IVC diam: 2.50 cm RV S prime:     10.20 cm/s TAPSE (M-mode): 1.8 cm LEFT ATRIUM           Index        RIGHT ATRIUM           Index LA diam:      5.00 cm 2.85 cm/m   RA Area:     21.30 cm LA Vol (A4C): 91.5 ml 52.10 ml/m  RA Volume:   71.00 ml  40.43 ml/m  AORTIC VALVE LVOT Vmax:   76.40 cm/s LVOT Vmean:  46.600 cm/s LVOT VTI:    0.094 m  AORTA Ao Root diam: 3.70 cm Ao Asc diam:  3.60 cm MITRAL VALVE                  TRICUSPID VALVE MV Area (PHT): 6.96 cm       TR Peak grad:   51.8 mmHg MV Decel Time: 109 msec       TR Vmax:        360.00 cm/s MR Peak grad:    104.9 mmHg MR Mean grad:    65.0 mmHg    SHUNTS MR Vmax:         512.00 cm/s  Systemic VTI:  0.09 m MR Vmean:        371.0 cm/s   Systemic Diam: 2.10 cm MR PISA:         3.08 cm MR PISA Eff ROA: 23 mm MR PISA Radius:  0.70 cm MV E velocity: 151.00 cm/s MV A  velocity: 77.10 cm/s MV E/A ratio:  1.96 Donato Schultz MD Electronically signed by Donato Schultz MD Signature Date/Time: 02/03/2023/11:56:49 AM    Final    CT Angio Chest PE W/Cm &/Or Wo Cm Result Date: 02/01/2023 CLINICAL DATA:  Chest pain and shortness of breath. Elevated D-dimer. EXAM: CT ANGIOGRAPHY CHEST WITH CONTRAST TECHNIQUE: Multidetector CT imaging of the chest was performed using the standard protocol during bolus administration of intravenous contrast. Multiplanar CT image reconstructions and MIPs were obtained to evaluate the vascular anatomy. RADIATION DOSE REDUCTION: This exam was performed according to the departmental dose-optimization  program which includes automated exposure control, adjustment of the mA and/or kV according to patient size and/or use of iterative reconstruction technique. CONTRAST:  60mL OMNIPAQUE IOHEXOL 350 MG/ML SOLN COMPARISON:  None Available. FINDINGS: Cardiovascular: Satisfactory opacification of pulmonary arteries noted, and no pulmonary emboli identified. No evidence of thoracic aortic aneurysm. Aortic and coronary atherosclerotic calcification incidentally noted. Mild cardiomegaly. Mediastinum/Nodes: No masses or pathologically enlarged lymph nodes identified. Lungs/Pleura: Mild-to-moderate centrilobular emphysema. Mild right upper lobe scarring. No pulmonary mass or infiltrate. Tiny loculated pleural fluid collection noted in the right minor fissure. Upper abdomen: No acute findings. Musculoskeletal: No suspicious bone lesions identified. Review of the MIP images confirms the above findings. IMPRESSION: No evidence of pulmonary embolism. Mild cardiomegaly. Tiny loculated pleural fluid collection in right minor fissure. Aortic Atherosclerosis (ICD10-I70.0) and Emphysema (ICD10-J43.9). Electronically Signed   By: Danae Orleans M.D.   On: 02/01/2023 18:43   DG Chest 2 View Result Date: 02/01/2023 CLINICAL DATA:  Chest pain.  Shortness of breath. EXAM: CHEST - 2 VIEW  COMPARISON:  01/28/2023. FINDINGS: Bilateral lung fields are clear. Bilateral costophrenic angles are clear. Stable cardio-mediastinal silhouette. No acute osseous abnormalities. The soft tissues are within normal limits. IMPRESSION: No active cardiopulmonary disease. Electronically Signed   By: Jules Schick M.D.   On: 02/01/2023 12:04     Assessment and Plan:   Non-Ischemic Cardiomyopathy Chronic Combined CHF Patient has a long history of cardiomyopathy back to 2014.  EF at that time 27%. Myoview was negative for ischemia.  EF has fluctuated over the years based on medication compliance.  Most recent Echo in 03/2017 showed LVEF of 45-50%.  He has been lost to follow-up since 2020.  He now presents with dyspnea on exertion was found to be severely anemic with hemoglobin of 6.3.  However, work-up also revealed a severe drop in his EF.  Echo showed LVEF of 20-25% with global hypokinesis and mild LVH, and grade 3 diastolic dysfunction as well as normal RV size and function with severely elevated PASP, biatrial enlargement (left > right), and severe MR (possible etiology malcoaptation from dilated LV).  He has received 3 doses of IV Lasix.  No documented urinary output.  Weight down 5 pounds since admission.  Renal function has worsened with diuresis to this was stopped today. - Does not look significantly volume overloaded on exam. - Will discuss additional diuresis with MD. - Continue Coreg 6.25mg  twice daily. - Unable to add any additional GDMT right now due to renal function and soft BP. - Monitor daily weights, strict I/Os, and renal function.  - Will plan for RHC tomorrow to help guide additional diuresis. - Will also ask [facility] team to see while herer. - Etiology unclear. Possible due to alcohol abuse and discussed the importance of cessation. He has never had definitive evaluation of his coronaries but Myoview in 2014 was negative. He is not currently a good cardiac catheterization  candidate given anemia and renal function. Would try to optimize GDMT and then repeat Echo in a couple of months. If EF does not improve, will need repeat ischemic evaluation (cardiac cath vs. cardiac PET).  Severe Mitral Regurgitation Noted on Echo this admission. - Suspect secondary to severe LV dilatation.  - Would plan to optimize GDMT as above and then repeat Echo in a couple of months. If no improvement in MR, will likely need Structural Heart Evaluation.   Hypertension BP initially mildly elevated in the 130s/ 90s but now soft at times with systolic  BP in the 90s. - Continue medications for CHF as above.  CKD Stage IIIb Creatinine 1.89 on admission. Last known baseline from 2019 was around 1.4 to 1.5.  - Creatinine peaked at 2.54 today after diuresis. - Continue to monitor closely.   Symptomatic Anemia Iron Deficiency Anemia Hemoglobin 6.3 on admission. Last known hemoglobin 12.4 in 2018. Hemoccult negative. Iron low at 22 and ferritin low at 9. TIBC elevated. Folate and Vitamin B12 normal. Retic count normal. S/p 2 units of PRBCs.  - Hemoglobin 7.1 today. Goal >8.0 given CHF. - Plan right now is for outpatient GI outpatient per primary team notes.  However, would consider inpatient evaluation. Also would consider giving iron. Will defer to primary team.  Alcohol Abuse Patient reports drinking a Fifth of Bourbon every 1.5 weeks. He denies any history of alcohol withdrawal.  - Discussed importance of complete cessation.  Tobacco Abuse Patient has around a 40 year smoking history and currently smoke 1 pack every 3-4 days. - Discussed importance of complete cessation.   Risk Assessment/Risk Scores:    New York Heart Association (NYHA) Functional Class NYHA Class II   For questions or updates, please contact [facility] Please consult [email or web address] for contact info under    Signed, Corrin Parker, PA-C  02/04/2023 2:30 PM   ATTENDING  ATTESTATION:  After conducting a review of all available clinical information with the care team, interviewing the patient, and performing a physical exam, I agree with the findings and plan described in this note.   GEN: No acute distress.   HEENT:  MMM, JVD ~8cm, no scleral icterus Cardiac: RRR, no murmurs, rubs, or gallops.  Respiratory: Clear to auscultation bilaterally. GI: Soft, nontender, non-distended  MS: No edema; No deformity. Neuro:  Nonfocal  Vasc:  +2 radial pulses  The patient is a very pleasant 57 year old African-American male with a history of nonischemic cardiomyopathy with previous EF of 20 to 25% which improved to nearly 50% on TTE in 2019, hypertension, CKD stage IIIb who is here with acute on chronic systolic heart failure.  The patient has been off of his medications for some time.  He tells me that he could not afford the medications.  Most recently he developed some increasing shortness of breath.  This ultimately led to an admission here at [facility].  His hemoglobin was found to be 6.3 and he received 2 units of packed red blood cells.  Echocardiogram demonstrated an ejection fraction of 20 to 25% with left ventricular hypertrophy and severe functional mitral regurgitation.  He also had evidence of biatrial enlargement.  He was given Lasix however his creatinine has increased to 2.5.  He believes the shortness is improved but not entirely better.  He endorses early satiety.  He denies any chest pain.  I am concerned about his ongoing anemia which has not adequately responded to transfusion.  It is not exactly clear to me whether the patient remains volume overloaded or not.  His JVP does not look to be severely elevated.  However he reports abdominal fullness.  I will refer him for right heart catheterization without coronary angiography to definitively evaluate his volume status, cardiac output and index.  He is warm and well-perfused so I do not think he is in  cardiogenic shock.  If he is in a low output state then inotrope aided diuresis can be helpful.  His blood pressure is also somewhat marginal though acceptable for this degree of LV impairment though it  limits our ability to pursue GDMT.  We will have our advanced heart failure colleagues see the patient as well.  Further evaluation of the patient's anemia should be pursued during this admission.  I suspect given the patient's longstanding alcohol history that this is continuing to his cardiomyopathy along with medical noncompliance.  His medications have been too costly for him.  Alverda Skeans, MD Pager [phone]

## 2023-02-05 ENCOUNTER — Encounter (HOSPITAL_COMMUNITY)
Admission: EM | Disposition: A | Discharge status: Home / Self Care | Payer: Self-pay | Source: Home / Self Care | Attending: Internal Medicine

## 2023-02-05 ENCOUNTER — Ambulatory Visit (HOSPITAL_COMMUNITY): Admit: 2023-02-05 | Payer: BC Managed Care – PPO | Admitting: Cardiology

## 2023-02-05 DIAGNOSIS — I5042 Chronic combined systolic (congestive) and diastolic (congestive) heart failure: Secondary | ICD-10-CM | POA: Diagnosis not present

## 2023-02-05 DIAGNOSIS — I5023 Acute on chronic systolic (congestive) heart failure: Secondary | ICD-10-CM | POA: Diagnosis not present

## 2023-02-05 DIAGNOSIS — D509 Iron deficiency anemia, unspecified: Secondary | ICD-10-CM | POA: Diagnosis not present

## 2023-02-05 DIAGNOSIS — N1832 Chronic kidney disease, stage 3b: Secondary | ICD-10-CM | POA: Diagnosis not present

## 2023-02-05 DIAGNOSIS — I10 Essential (primary) hypertension: Secondary | ICD-10-CM | POA: Diagnosis not present

## 2023-02-05 DIAGNOSIS — D649 Anemia, unspecified: Secondary | ICD-10-CM | POA: Diagnosis not present

## 2023-02-05 DIAGNOSIS — I5043 Acute on chronic combined systolic (congestive) and diastolic (congestive) heart failure: Secondary | ICD-10-CM

## 2023-02-05 HISTORY — PX: RIGHT HEART CATH: CATH118263

## 2023-02-05 LAB — BASIC METABOLIC PANEL
Anion gap: 10 (ref 5–15)
BUN: 34 mg/dL — ABNORMAL HIGH (ref 6–20)
CO2: 22 mmol/L (ref 22–32)
Calcium: 8.5 mg/dL — ABNORMAL LOW (ref 8.9–10.3)
Chloride: 103 mmol/L (ref 98–111)
Creatinine, Ser: 2.29 mg/dL — ABNORMAL HIGH (ref 0.61–1.24)
GFR, Estimated: 32 mL/min — ABNORMAL LOW (ref >60)
Glucose, Bld: 91 mg/dL (ref 70–99)
Potassium: 3.9 mmol/L (ref 3.5–5.1)
Sodium: 135 mmol/L (ref 135–145)

## 2023-02-05 LAB — POCT I-STAT EG7
Acid-base deficit: 1 mmol/L (ref 0.0–2.0)
Bicarbonate: 23.7 mmol/L (ref 20.0–28.0)
Calcium, Ion: 1.15 mmol/L (ref 1.15–1.40)
HCT: 28 % — ABNORMAL LOW (ref 39.0–52.0)
Hemoglobin: 9.5 g/dL — ABNORMAL LOW (ref 13.0–17.0)
O2 Saturation: 65 %
Potassium: 3.8 mmol/L (ref 3.5–5.1)
Sodium: 137 mmol/L (ref 135–145)
TCO2: 25 mmol/L (ref 22–32)
pCO2, Ven: 37.8 mm[Hg] — ABNORMAL LOW (ref 44–60)
pH, Ven: 7.404 (ref 7.25–7.43)
pO2, Ven: 34 mm[Hg] (ref 32–45)

## 2023-02-05 LAB — POCT I-STAT 7, (LYTES, BLD GAS, ICA,H+H)
Acid-base deficit: 8 mmol/L — ABNORMAL HIGH (ref 0.0–2.0)
Bicarbonate: 17.7 mmol/L — ABNORMAL LOW (ref 20.0–28.0)
Calcium, Ion: 0.87 mmol/L — CL (ref 1.15–1.40)
HCT: 22 % — ABNORMAL LOW (ref 39.0–52.0)
Hemoglobin: 7.5 g/dL — ABNORMAL LOW (ref 13.0–17.0)
O2 Saturation: 100 %
Patient temperature: 36.4
Potassium: 5.7 mmol/L — ABNORMAL HIGH (ref 3.5–5.1)
Sodium: 132 mmol/L — ABNORMAL LOW (ref 135–145)
TCO2: 19 mmol/L — ABNORMAL LOW (ref 22–32)
pCO2 arterial: 33.3 mm[Hg] (ref 32–48)
pH, Arterial: 7.331 — ABNORMAL LOW (ref 7.35–7.45)
pO2, Arterial: 539 mm[Hg] — ABNORMAL HIGH (ref 83–108)

## 2023-02-05 LAB — CBC
HCT: 31.3 % — ABNORMAL LOW (ref 39.0–52.0)
HCT: 31.4 % — ABNORMAL LOW (ref 39.0–52.0)
Hemoglobin: 9 g/dL — ABNORMAL LOW (ref 13.0–17.0)
Hemoglobin: 9.2 g/dL — ABNORMAL LOW (ref 13.0–17.0)
MCH: 20.8 pg — ABNORMAL LOW (ref 26.0–34.0)
MCH: 21.1 pg — ABNORMAL LOW (ref 26.0–34.0)
MCHC: 28.8 g/dL — ABNORMAL LOW (ref 30.0–36.0)
MCHC: 29.3 g/dL — ABNORMAL LOW (ref 30.0–36.0)
MCV: 72.3 fL — ABNORMAL LOW (ref 80.0–100.0)
MCV: 71.9 fL — ABNORMAL LOW (ref 80.0–100.0)
Platelets: 255 10*3/uL (ref 150–400)
Platelets: 272 10*3/uL (ref 150–400)
RBC: 4.33 MIL/uL (ref 4.22–5.81)
RBC: 4.37 MIL/uL (ref 4.22–5.81)
RDW: 22.5 % — ABNORMAL HIGH (ref 11.5–15.5)
RDW: 22.6 % — ABNORMAL HIGH (ref 11.5–15.5)
WBC: 9.7 10*3/uL (ref 4.0–10.5)
WBC: 8.2 10*3/uL (ref 4.0–10.5)
nRBC: 0.2 % (ref 0.0–0.2)
nRBC: 0.2 % (ref 0.0–0.2)

## 2023-02-05 LAB — BRAIN NATRIURETIC PEPTIDE: B Natriuretic Peptide: 1010.9 pg/mL — ABNORMAL HIGH (ref 0.0–100.0)

## 2023-02-05 LAB — MAGNESIUM: Magnesium: 2.1 mg/dL (ref 1.7–2.4)

## 2023-02-05 LAB — CREATININE, SERUM
Creatinine, Ser: 2.2 mg/dL — ABNORMAL HIGH (ref 0.61–1.24)
GFR, Estimated: 34 mL/min — ABNORMAL LOW (ref >60)

## 2023-02-05 SURGERY — RIGHT HEART CATH

## 2023-02-05 MED ORDER — FENTANYL CITRATE (PF) 100 MCG/2ML IJ SOLN
INTRAMUSCULAR | Status: DC | PRN
  Administered 2023-02-05: 25 ug via INTRAVENOUS

## 2023-02-05 MED ORDER — PEG-KCL-NACL-NASULF-NA ASC-C 100 G PO SOLR: 1.0000 | Freq: Once | ORAL | Status: DC

## 2023-02-05 MED ORDER — LIDOCAINE HCL (PF) 1 % IJ SOLN
INTRAMUSCULAR | Status: DC | PRN
  Administered 2023-02-05: 2 mL

## 2023-02-05 MED ORDER — PEG-KCL-NACL-NASULF-NA ASC-C 100 G PO SOLR
0.5000 | Freq: Once | ORAL | Status: AC
  Administered 2023-02-05: 100 g via ORAL
  Filled 2023-02-05: qty 1

## 2023-02-05 MED ORDER — HEPARIN (PORCINE) IN NACL 1000-0.9 UT/500ML-% IV SOLN
INTRAVENOUS | Status: DC | PRN
  Administered 2023-02-05: 500 mL

## 2023-02-05 MED ORDER — SODIUM CHLORIDE 0.9% FLUSH
3.0000 mL | Freq: Two times a day (BID) | INTRAVENOUS | Status: DC
  Administered 2023-02-05: 3 mL via INTRAVENOUS

## 2023-02-05 MED ORDER — SODIUM CHLORIDE 0.9% FLUSH: 3.0000 mL | INTRAVENOUS | Status: DC | PRN

## 2023-02-05 MED ORDER — PEG-KCL-NACL-NASULF-NA ASC-C 100 G PO SOLR
0.5000 | Freq: Once | ORAL | Status: AC
  Administered 2023-02-05: 100 g via ORAL
  Filled 2023-02-05 (×2): qty 1

## 2023-02-05 MED ORDER — FENTANYL CITRATE (PF) 100 MCG/2ML IJ SOLN
INTRAMUSCULAR | Status: AC
  Filled 2023-02-05: qty 2

## 2023-02-05 MED ORDER — SODIUM CHLORIDE 0.9 % IV SOLN: 250.0000 mL | INTRAVENOUS | Status: DC | PRN

## 2023-02-05 MED ORDER — HYDRALAZINE HCL 20 MG/ML IJ SOLN: 10.0000 mg | INTRAMUSCULAR | Status: DC | PRN

## 2023-02-05 MED ORDER — LIDOCAINE HCL (PF) 1 % IJ SOLN
INTRAMUSCULAR | Status: AC
  Filled 2023-02-05: qty 30

## 2023-02-05 MED ORDER — MIDAZOLAM HCL 2 MG/2ML IJ SOLN
INTRAMUSCULAR | Status: AC
  Filled 2023-02-05: qty 2

## 2023-02-05 MED ORDER — MIDAZOLAM HCL 2 MG/2ML IJ SOLN
INTRAMUSCULAR | Status: DC | PRN
  Administered 2023-02-05: 1 mg via INTRAVENOUS

## 2023-02-05 MED ORDER — ENOXAPARIN SODIUM 40 MG/0.4ML IJ SOSY: 40.0000 mg | PREFILLED_SYRINGE | INTRAMUSCULAR | Status: DC

## 2023-02-05 SURGICAL SUPPLY — 6 items
CATH SWAN GANZ 7F STRAIGHT (CATHETERS) IMPLANT
GLIDESHEATH SLENDER 7FR .021G (SHEATH) IMPLANT
PACK CARDIAC CATHETERIZATION (CUSTOM PROCEDURE TRAY) ×2 IMPLANT
TRANSDUCER W/STOPCOCK (MISCELLANEOUS) IMPLANT
TUBING ART PRESS 72 MALE/FEM (TUBING) IMPLANT
WIRE EMERALD 3MM-J .025X260CM (WIRE) IMPLANT

## 2023-02-05 NOTE — Consult Note (Addendum)
Consultation Note   Referring Provider:  [facility] PCP: Patient, No Pcp Per Primary Gastroenterologist: Unassigned        Reason for Consultation: Iron deficiency anemia  DOA: 02/01/2023         Hospital Day: 5   ASSESSMENT    Brief Narrative:  57 y.o. year old male with nonischemic cardiomyopathy, chronic combined CHF , severe mitral regurgitation,  hypertension, CKD, tobacco abuse, THC use,   Chronic microcytosis, now with severe iron deficiency anemia and FOBT negative Presenting hemoglobin 6.3.  No overt GI bleeding. No recent labs but in 2018 his hemoglobin was 12.4  Nonischemic cardiomyopathy / severe MR / CHF with EF of 20-25% Diuretics stopped due to worsening renal failure. Plan is for eventual RHC when renal function improved and anemia evaluated.    CKD 3b Creatinin5 1.89 on admission, up to 2.29 today  Etoh abuse without evidence for cirrhosis.  Albumin 3, normal INR, normal platelets  Principal Problem:   Symptomatic anemia Active Problems:   Essential hypertension   Chronic combined systolic and diastolic heart failure (HCC)   CKD stage 3b, GFR 30-44 ml/min (HCC)   Atypical chest pain   Elevated troponin   Iron deficiency anemia     PLAN:   --EGD and colonoscopy if Cardiology feels he is safe for sedation / procedure. The risks and benefits of EGD and colonoscopy with possible biopsies / polypectomy were discussed with the patient who agrees to proceed.  --Keep on clears only for now.   HPI   Norman Jones was admitted several days ago with squeezing chest pain  /chronic heart failure.   Found to be severely anemic with iron deficiency .  Hemoglobin 6.3.  No significant shortness of breath other than with exertion . Norman Jones has not had any overt GI blood loss.  No black stools.  No abdominal pain.  No nausea, vomiting or other GI symptoms.  He used to take NSAIDs but none in several months.  He does not donate  blood.  No epistaxis.  No hematuria.  No family history of anemia as far as he knows.  Norman Jones tells me he has bad habits such as drinking too much alcohol.  He drinks a pint  of Etoh / day.    Notable labs / Imaging / Events this admission  :  Hemoglobin 6.3 >> now 9.4 after 2 units of RBCs Platelets 288 Ferritin 9 INR 1.2 Albumin 3.0, T. bili 1.3, remainder of LFTs normal BUN 34, creatinine 2.29 Chest x-ray - no active cardiopulmonary disease Chest CTA-no PE BNP 2756 High-sensitivity troponin 90   Previous GI Evaluations   none  Labs and Imaging: Recent Labs    02/03/23 0737  WBC 10.0  HGB 9.4*  HCT 32.1*  PLT 288   Recent Labs    02/03/23 0737 02/04/23 0622 02/05/23 0547  NA 139 137 135  K 4.3 4.2 3.9  CL 105 102 103  CO2 23 26 22   GLUCOSE 94 99 91  BUN 26* 32* 34*  CREATININE 2.24* 2.54* 2.29*  CALCIUM 9.0 8.7* 8.5*   Recent Labs    02/03/23 0737  PROT 5.5*  ALBUMIN 3.0*  AST 36  ALT 25  ALKPHOS 71  BILITOT  1.3*   No results for input(s): "HEPBSAG", "HCVAB", "HEPAIGM", "HEPBIGM" in the last 72 hours. No results for input(s): "LABPROT", "INR" in the last 72 hours.   Echocardiogram 02/03/2023: Impressions:  1. Severe MR (possible etiology malcoaptation from dilated LV).   2. Left ventricular ejection fraction, by estimation, is 20 to 25%. The  left ventricle has severely decreased function. The left ventricle  demonstrates global hypokinesis. The left ventricular internal cavity size  was severely dilated. There is mild  left ventricular hypertrophy. Left ventricular diastolic parameters are  consistent with Grade III diastolic dysfunction (restrictive).   3. Right ventricular systolic function is normal. The right ventricular  size is normal. There is severely elevated pulmonary artery systolic  pressure.   4. Left atrial size was severely dilated.   5. Right atrial size was moderately dilated.   6. PISA 0.7 cm. The mitral valve is normal in  structure. Severe mitral  valve regurgitation. No evidence of mitral stenosis.   7. Tricuspid valve regurgitation is moderate.   8. The aortic valve is normal in structure. Aortic valve regurgitation is  mild. No aortic stenosis is present.   9. The inferior vena cava is dilated in size with <50% respiratory  variability, suggesting right atrial pressure of 15 mmHg.   Comparison(s): No significant change from prior study. Prior images  reviewed side by side.     Past Medical History:  Diagnosis Date   Asthma    CHF (congestive heart failure) (HCC) 09/29/2012   Hypertension    Seasonal allergies     History reviewed. No pertinent surgical history.  Family History  Problem Relation Age of Onset   Cancer Mother        Breast cancer   Diabetes Maternal Grandmother     Prior to Admission medications   Medication Sig Start Date End Date Taking? Authorizing Provider  aspirin 325 MG tablet Take 650 mg by mouth daily as needed (decongestant).   Yes [provider]    Current Facility-Administered Medications  Medication Dose Route Frequency Provider Last Rate Last Admin   acetaminophen (TYLENOL) tablet 650 mg  650 mg Oral Q6H PRN Howerter, Justin B, DO       Or   acetaminophen (TYLENOL) suppository 650 mg  650 mg Rectal Q6H PRN Howerter, Justin B, DO       carvedilol (COREG) tablet 6.25 mg  6.25 mg Oral BID WC Carollee Herter, DO   6.25 mg at 02/04/23 0844   melatonin tablet 3 mg  3 mg Oral QHS PRN Howerter, Justin B, DO       ondansetron (ZOFRAN) injection 4 mg  4 mg Intravenous Q6H PRN Howerter, Justin B, DO       sodium chloride flush (NS) 0.9 % injection 10 mL  10 mL Intravenous Q12H Marjie Skiff E, PA-C   10 mL at 02/04/23 2100    Allergies as of 02/01/2023 - Review Complete 02/01/2023  Allergen Reaction Noted   Penicillins Other (See Comments) 05/29/2012    Social History   Socioeconomic History   Marital status: Single    Spouse name: Not on file   Number  of children: 0   Years of education: Not on file   Highest education level: Bachelor's degree (e.g., BA, AB, BS)  Occupational History   Occupation: IT Support  Tobacco Use   Smoking status: Every Day    Current packs/day: 0.50    Average packs/day: 0.5 packs/day for 39.0 years (19.5 ttl pk-yrs)  Types: Cigarettes    Start date: 02/04/1984   Smokeless tobacco: Never  Vaping Use   Vaping status: Never Used  Substance and Sexual Activity   Alcohol use: Yes    Comment: socially   Drug use: Yes    Types: Marijuana    Comment: 3 x per week   Sexual activity: Yes    Birth control/protection: None  Other Topics Concern   Not on file  Social History Narrative   Marital status: divorced      Children: none      Employment: Scientist, product/process development; works from home      Tobacco: 1 ppd      Alcohol:  3 ounces on weekends      Drugs:  None   Social Drivers of Corporate investment banker Strain: Low Risk  (02/04/2023)   Overall Financial Resource Strain (CARDIA)    Difficulty of Paying Living Expenses: Not very hard  Food Insecurity: No Food Insecurity (02/01/2023)   Hunger Vital Sign    Worried About Running Out of Food in the Last Year: Never true    Ran Out of Food in the Last Year: Never true  Transportation Needs: No Transportation Needs (02/04/2023)   PRAPARE - Administrator, Civil Service (Medical): No    Lack of Transportation (Non-Medical): No  Physical Activity: Not on file  Stress: Not on file  Social Connections: Not on file  Intimate Partner Violence: Not At Risk (02/01/2023)   Humiliation, Afraid, Rape, and Kick questionnaire    Fear of Current or Ex-Partner: No    Emotionally Abused: No    Physically Abused: No    Sexually Abused: No     Code Status   Code Status: Full Code  Review of Systems: All systems reviewed and negative except where noted in HPI.  Physical Exam: Vital signs in last 24 hours: Temp:  [97.5 F (36.4 C)-97.8 F (36.6 C)]  97.5 F (36.4 C) (12/17 0759) Pulse Rate:  [81-98] 87 (12/17 0759) Resp:  [18] 18 (12/17 0012) BP: (95-126)/(62-89) 126/89 (12/17 0759) SpO2:  [96 %-100 %] 100 % (12/17 0759) Weight:  [64.3 kg] 64.3 kg (12/17 0500) Last BM Date : 02/04/23  General:  Pleasant male in NAD Psych:  Cooperative. Normal mood and affect Eyes: Pupils equal Ears:  Normal auditory acuity Nose: No deformity, discharge or lesions Neck:  Supple, no masses felt Lungs:  Clear to auscultation.  Heart:  Regular rate, regular rhythm.  Abdomen:  Soft, nondistended, nontender, active bowel sounds, no masses felt Rectal :  Deferred Msk: Symmetrical without gross deformities.  Neurologic:  Alert, oriented, grossly normal neurologically Extremities : No edema Skin:  Intact without significant lesions.    Intake/Output from previous day: 12/16 0701 - 12/17 0700 In: 360 [P.O.:360] Out: -  Intake/Output this shift:  No intake/output data recorded.   Willette Cluster, NP-C   02/05/2023, 8:56 AM

## 2023-02-05 NOTE — Progress Notes (Signed)
Called pharmacy regarding the Peg 3350 powder. They stated there was a cart making its way up here.

## 2023-02-05 NOTE — Interval H&P Note (Signed)
History and Physical Interval Note:  02/05/2023 2:00 PM  Norman Jones.  has presented today for surgery, with the diagnosis of heart failure.  The various methods of treatment have been discussed with the patient and family. After consideration of risks, benefits and other options for treatment, the patient has consented to  Procedure(s): RIGHT HEART CATH (N/A) as a surgical intervention.  The patient's history has been reviewed, patient examined, no change in status, stable for surgery.  I have reviewed the patient's chart and labs.  Questions were answered to the patient's satisfaction.     Daniella Dewberry

## 2023-02-05 NOTE — Consult Note (Addendum)
Advanced Heart Failure Team Consult Note   Primary Physician: Patient, No Pcp Per PCP-Cardiologist:  Rollene Rotunda, MD  Reason for Consultation: Acute on Chronic Systolic and Diastolic Heart Failure  HPI:    Norman Mcevoy. is seen today for evaluation of acute on chronic systolic and diastolic heart failure at the request of Dr. Lynnette Caffey.   Norman Molder. is a 57 y.o. male with a history of NICM, chronic HFrEF (EF as low as 20-25% in 2018 but improved to 45-50% on last Echo in 03/2017), hypertension, CKD stage IIIb, ETOH abuse, current smoker, and asthma.  Cardiac history dates back to 2014. Echo 2014 showed LVEF 27% w G1DD and mild MR. Had initially done well, however lost is job and was unable to afford medications. Presented to ED in 2018 with worsening function, EF 20-25% w severe RV dysfunction, mod MR and TR, mod to severe PA peak 67. He was restarted on GDMT with EF recovery in 2019 to 45-50% and mild to mod MR.   He has been followed by Dr. Antoine Poche but lost to follow up since 2020.   Patient presented to the ED on 02/01/2023 for further evaluation of chest pain and shortness of breath at work. Previously had been feeling fine at home. Not on any medications. No edema, palpitations or SOB. Reports ETOH use of a 5th of liquor a day, smoking 0.5ppd, and occasionally smokes marijuana, although not often. No other drug use. No cardiac family history that he is aware of.   In ED, he was mildly hypertensive and tachycardic. EKG again showed ST 114 bpm, with severe LVH. Hs-trop 105 >> 90. BNP 879. D-dimer elevated at 1.18. Chest x-ray showed no acute findings. Chest CTA showed mild cardiomegaly but no evidence of PE. WBC 7.0, Hgb 6.3, Plts 287. Na 141, K 4.2, BUN 14, Cr 1.80. Hemoccult was negative. He was admitted for diabetic anemia and has received 2 units of PRBCs. Echo showed EF 20-25%, gHK, G3DD, RV nl, severe MR, mod TR, RVSP 61.8. Cardiology was consulted and he was started on  intermittent Lasix IV 40 mg. Despite diuresis sCr has continued to climb 1.9>2.24>2.54>2.29.  Today sitting on the edge of bed. No SOB or CP. Plan for RHC today.  Home Medications Prior to Admission medications   Medication Sig Start Date End Date Taking? Authorizing Provider  aspirin 325 MG tablet Take 650 mg by mouth daily as needed (decongestant).   Yes [provider]    Past Medical History: Past Medical History:  Diagnosis Date   Asthma    CHF (congestive heart failure) (HCC) 09/29/2012   Hypertension    Seasonal allergies     Past Surgical History: History reviewed. No pertinent surgical history.  Family History: Family History  Problem Relation Age of Onset   Cancer Mother        Breast cancer   Diabetes Maternal Grandmother     Social History: Social History   Socioeconomic History   Marital status: Single    Spouse name: Not on file   Number of children: 0   Years of education: Not on file   Highest education level: Bachelor's degree (e.g., BA, AB, BS)  Occupational History   Occupation: IT Support  Tobacco Use   Smoking status: Every Day    Current packs/day: 0.50    Average packs/day: 0.5 packs/day for 39.0 years (19.5 ttl pk-yrs)    Types: Cigarettes    Start date: 02/04/1984   Smokeless  tobacco: Never  Vaping Use   Vaping status: Never Used  Substance and Sexual Activity   Alcohol use: Yes    Comment: socially   Drug use: Yes    Types: Marijuana    Comment: 3 x per week   Sexual activity: Yes    Birth control/protection: None  Other Topics Concern   Not on file  Social History Narrative   Marital status: divorced      Children: none      Employment: Scientist, product/process development; works from home      Tobacco: 1 ppd      Alcohol:  3 ounces on weekends      Drugs:  None   Social Drivers of Corporate investment banker Strain: Low Risk  (02/04/2023)   Overall Financial Resource Strain (CARDIA)    Difficulty of Paying Living Expenses: Not  very hard  Food Insecurity: No Food Insecurity (02/01/2023)   Hunger Vital Sign    Worried About Running Out of Food in the Last Year: Never true    Ran Out of Food in the Last Year: Never true  Transportation Needs: No Transportation Needs (02/04/2023)   PRAPARE - Administrator, Civil Service (Medical): No    Lack of Transportation (Non-Medical): No  Physical Activity: Not on file  Stress: Not on file  Social Connections: Not on file    Allergies:  Allergies  Allergen Reactions   Penicillins Other (See Comments)    Pt states he was told he was allergic by his mother, unknown reaction.    Objective:    Vital Signs:   Temp:  [97.5 F (36.4 C)-97.8 F (36.6 C)] 97.5 F (36.4 C) (12/17 0759) Pulse Rate:  [81-98] 87 (12/17 0759) Resp:  [18] 18 (12/17 0012) BP: (95-126)/(62-89) 126/89 (12/17 0759) SpO2:  [96 %-100 %] 100 % (12/17 0759) Weight:  [64.3 kg] 64.3 kg (12/17 0500) Last BM Date : 02/04/23  Weight change: Filed Weights   02/03/23 0336 02/04/23 0336 02/05/23 0500  Weight: 61.9 kg 61 kg 64.3 kg    Intake/Output:   Intake/Output Summary (Last 24 hours) at 02/05/2023 1000 Last data filed at 02/04/2023 2000 Gross per 24 hour  Intake 240 ml  Output --  Net 240 ml      Physical Exam    General: Well appearing. No distress on RA HEENT: neck supple.   Cardiac: JVP to midneck. S1 and S2 present. No murmurs or rub. Resp: Lung sounds clear and equal B/L Abdomen: Soft, non-tender, non-distended. + BS. Extremities: Warm and dry. No rash, cyanosis.  No edema.  Neuro: Alert and oriented x3. Affect pleasant. Moves all extremities without difficulty.  Telemetry   SR in 60-70s (personally reviewed)  EKG    No new ECG to review  Labs   Basic Metabolic Panel: Recent Labs  Lab 02/01/23 1119 02/01/23 2304 02/02/23 0609 02/03/23 0737 02/04/23 0622 02/05/23 0547  NA 141  --  141 139 137 135  K 4.2  --  4.4 4.3 4.2 3.9  CL 107  --  108 105 102  103  CO2 25  --  23 23 26 22   GLUCOSE 100*  --  89 94 99 91  BUN 14  --  23* 26* 32* 34*  CREATININE 1.80*  --  1.98* 2.24* 2.54* 2.29*  CALCIUM 9.0  --  8.7* 9.0 8.7* 8.5*  MG  --  1.9 1.9  --   --   --  Liver Function Tests: Recent Labs  Lab 02/02/23 0609 02/03/23 0737  AST 26 36  ALT 18 25  ALKPHOS 69 71  BILITOT 1.1 1.3*  PROT 5.4* 5.5*  ALBUMIN 2.9* 3.0*   Recent Labs  Lab 02/02/23 0609  LIPASE 37   No results for input(s): "AMMONIA" in the last 168 hours.  CBC: Recent Labs  Lab 02/01/23 1119 02/01/23 2304 02/02/23 0609 02/02/23 0844 02/03/23 0737 02/05/23 0832  WBC 7.0  --  9.1  --  10.0 9.7  NEUTROABS  --   --  6.9  --   --   --   HGB 6.3* 7.4* 7.1* 7.1* 9.4* 9.0*  HCT 22.9* 26.4* 24.1* 25.3* 32.1* 31.3*  MCV 69.8*  --  69.3*  --  70.7* 72.3*  PLT 287  --  254  --  288 255   Cardiac Enzymes: No results for input(s): "CKTOTAL", "CKMB", "CKMBINDEX", "TROPONINI" in the last 168 hours.  BNP: BNP (last 3 results) Recent Labs    01/28/23 1451 02/01/23 1253 02/03/23 0737  BNP 1,106.2* 879.5* 2,756.5*   ProBNP (last 3 results) No results for input(s): "PROBNP" in the last 8760 hours.  CBG: No results for input(s): "GLUCAP" in the last 168 hours.  Coagulation Studies: No results for input(s): "LABPROT", "INR" in the last 72 hours.  Imaging   No results found.  Medications:     Current Medications:  carvedilol  6.25 mg Oral BID WC   sodium chloride flush  10 mL Intravenous Q12H    Infusions:   Patient Profile   Norman Dewoody. is a 57 y.o. male with a history of NICM/chronic HFimpEF  (EF as low as 20-25% in 2018 but improved to 45-50% on last Echo in 03/2017), hypertension, CKD stage IIIb, and asthma.  Assessment/Plan   Acute on Chronic Systolic and Diastolic Heart Failure - Etiology uncertain. NICM 2014 EF 27%. Myoview negative. No known family history. Heavy ETOH use 5th/day. Now with progressive valvular disease and MR. Of note  has had elevated pulmonary pressures noted by Echo since 2018.  - Echo 2/18: EF 20-25% w severe RV dysfunction, mod MR and TR, mod to severe PA peak 67 - Echo 2/19: EF 45-50% with mild to mod MR. - Echo 12/24: EF 20-25%, gHK, G3DD, RV nl, severe MR, mod TR, RVSP 61.8.  - CP and dyspnea on admission. Trop 28>32. BNP 2,756. Bicarb 23. - NYHA I. He does not appear to be low output by exam. Warm/dry.  - No peripheral edema noted on exam, although JVP elevated. Received 2u RBCs 12/13 and 12/14 which could contribute to volume. Stated he had minimal response to diuresis. - Hold Coreg  - GDMT limited by renal function - RHC today to guide management  2. 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 ?Some component of Group 3 given asthma and smoking history.  - Consider PFTs and OP sleep study - RHC today  3. AKI on CKDIII - baseline sCr ~1.4-1.8 - On admit 1.8 - sCr trending up 1.98>2.24>2.54>2.29 - Follow BMET  4. Mitral Regurg - mod on Echo 2/18 and 2/19 - severe on Echo 12/24  - ?component of functional MR - Repeat echo once volume status improved, if remains severe could consider Mitral clip in the future   5. Tricuspid Regurg - mod on Echo 9/19 and 12/24  6. Iron Deficiency Anemia - Ferritin 9 and hgb  6.3 on admission - 2 u RBCs per primary - GI Following. FOBT negative. Recommending colonoscopy and EGD - Fe infusion once need for cMRI determined  7. Alcohol abuse - Drinking 5th of liquor a day - Would like to stop drinking, does not like how it makes him feel. Suspect aspect of depression. - Cessation importance discussed.  8. Current smoker - 0.5 ppd at this time - Expresses he wants to cut back, discussed importance  Length of Stay: 3  Norman Malarie Tappen, NP  02/05/2023, 10:00 AM  Advanced Heart Failure Team Pager [phone] (M-F; 7a - 5p)  Please contact [facility] for  night-coverage after hours (4p -7a ) and weekends on [email or web address]

## 2023-02-05 NOTE — TOC Initial Note (Signed)
Transition of Care (TOC) - Initial/Assessment Note    Patient Details  Name: Norman Jones. MRN: [id number] Date of Birth: 12/11/1965  Transition of Care [facility]) CM/SW Contact:    Reva Bores, LCSWA Phone Number:[phone] 02/05/2023, 12:18 PM  Clinical Narrative:   HF CSW met with pt at bedside. Pt stated that he lives alone. Pt stated that he has no history of HH services. Pt stated that he does not use any equipment. Pt stated that he has a scale at home. Pt stated that he will need a note for work. At this time pt has no PCP. CSW explained that a hospital follow up appointment will be scheduled closer to dc. Pt agrees.   TOC will continue following.                 Expected Discharge Plan: Home/Self Care Barriers to Discharge: Continued Medical Work up   Patient Goals and CMS Choice Patient states their goals for this hospitalization and ongoing recovery are:: return to back          Expected Discharge Plan and Services       Living arrangements for the past 2 months: Single Family Home                                      Prior Living Arrangements/Services Living arrangements for the past 2 months: Single Family Home Lives with:: Self Patient language and need for interpreter reviewed:: Yes Do you feel safe going back to the place where you live?: Yes      Need for Family Participation in Patient Care: No (Comment) Care giver support system in place?: Yes (comment)   Criminal Activity/Legal Involvement Pertinent to Current Situation/Hospitalization: No - Comment as needed  Activities of Daily Living   ADL Screening (condition at time of admission) Independently performs ADLs?: Yes (appropriate for developmental age) Is the patient deaf or have difficulty hearing?: No Does the patient have difficulty seeing, even when wearing glasses/contacts?: No Does the patient have difficulty concentrating, remembering, or making decisions?: No  Permission  Sought/Granted                  Emotional Assessment Appearance:: Appears stated age Attitude/Demeanor/Rapport: Engaged Affect (typically observed): Appropriate Orientation: : Oriented to Situation, Oriented to  Time, Oriented to Place, Oriented to Self Alcohol / Substance Use: Not Applicable Psych Involvement: No (comment)  Admission diagnosis:  Elevated troponin [R79.89] Symptomatic anemia [D64.9] Patient Active Problem List   Diagnosis Date Noted   Iron deficiency anemia 02/02/2023   Symptomatic anemia 02/01/2023   Atypical chest pain 02/01/2023   Elevated troponin 02/01/2023   Educated about COVID-19 virus infection 06/11/2018   Tobacco abuse 11/29/2017   CKD stage 3b, GFR 30-44 ml/min (HCC) 03/29/2017   Non-rheumatic mitral regurgitation 01/22/2017   NICM (nonischemic cardiomyopathy) (HCC) 09/17/2016   Prediabetes 08/23/2016   Healthcare maintenance 08/23/2016   Dry skin 08/23/2016   Essential hypertension 09/29/2012   Chronic combined systolic and diastolic heart failure (HCC) 09/29/2012   PCP:  Patient, No Pcp Per Pharmacy:   [facility] [phone] Ginette Otto, [place] - [address] [place] [address] Phone: [phone] Fax: [phone]  [facility] [address] [place] [address] Phone: [phone] Fax: [phone]     Social Drivers of Health (SDOH) Social History: SDOH Screenings   Food  Insecurity: No Food Insecurity (02/01/2023)  Housing: Low Risk  (02/04/2023)  Transportation Needs: No Transportation Needs (02/04/2023)  Utilities: Not At Risk (02/01/2023)  Alcohol Screen: Low Risk  (02/04/2023)  Financial Resource Strain: Low Risk  (02/04/2023)  Tobacco Use: High Risk (02/01/2023)   SDOH Interventions: Housing Interventions: Intervention Not Indicated Transportation Interventions: Intervention Not Indicated Alcohol Usage Interventions: Intervention Not Indicated (Score  <7) Financial Strain Interventions: Intervention Not Indicated   Readmission Risk Interventions     No data to display

## 2023-02-05 NOTE — Plan of Care (Signed)

## 2023-02-05 NOTE — Progress Notes (Signed)
PROGRESS NOTE    Norman Jones.  ZOX:[id number] DOB: [birth year] DOA: 02/01/2023 PCP: Patient, No Pcp Per  Subjective: Pt seen and examined. Pt seen by cardiology yesterday. They are planning cardiac cath today. Cards also wanting inpatient workup of anemia by GI instead of outpatient workup that I had suggested. GI has been consulted.  Pt otherwise feeling good. Admits to having 2 drinks of liquor each night. Not more than 2 drinks. Also has been smoking.  Pt has decided since being in the hospital since Friday, that he does not need to drink etoh anymore and does not have any cravings for cigarettes now.   Hospital Course: HPI:  Norman Jones. is a 57 y.o. male with medical history significant for chronic systolic/diastolic heart failure with most recent LVEF 45 to 50%, Norman Jones, who is admitted to [facility] on 02/01/2023 with symptomatic anemia after presenting from home to [facility] ED complaining of shortness of breath.    The patient reports 4 to 5 days of intermittent shortness of breath, worse with exertion, in the absence of any orthopnea, PND, or worsening of edema in the lower extremities.  Denies any associated subjective fever, chills, rigors, or generalized myalgias.  No recent cough, hemoptysis, new lower extremity erythema or new calf tenderness.  No wheezing.    He also reports 2 episodes of sharp, nonradiating left-sided chest discomfort over the last day.  But these episodes have occurred at rest at did not intensify with exertion.  Both episodes lasted for less than 10 minutes before spontaneously resolving in the absence of any nitroglycerin.  First episode occurred while at rest on the morning of 02/01/2023, with the second such episode occurring while at rest at work.  Denies any associated palpitations, diaphoresis, nausea, vomiting, dizziness, presyncope, or syncope.   He conveys that it has been several  years since he has previously seen a physician.  Most recent prior hemoglobin was found to be 12.4 in July 2018.  Not on any blood thinners as an outpatient.  Denies any recent abdominal pain prn or any recent melena or hematochezia.  No recent hematemesis or gross hematuria.  Significant Events: Admitted 02/01/2023 for symptomatic anemia   Significant Labs: Admission HgB 6.3, MCV 69.8, BUN 14, Scr 1.8 BNP 879 Iron 22(low), TIBC 486(high), %sat 5(low), ferritin 9(low) B12 304(nl) Folate 11.4(nl)  Significant Imaging Studies: Admission CTPA negative for PE  Antibiotic Therapy: Anti-infectives (From admission, onward)    None       Procedures:   Consultants:     Assessment and Plan: * Symptomatic anemia 02-02-2023 transfused with 1 unit PRBC last night. Labs reveal iron deficiency. He will need outpatient GI evaluation at least colonoscopy. Check UA to ensure no hematuria. Repeat HgB 7.1 this AM. Will give 1 more unit or PRBC today. Give 40 mg IV lasix prior to transfusion.  02-03-2023 HgB 9.4 g/dl after 2 unit PRBC transfusion. Outpatient GI evaluation.  02-04-2023 will refer to outpatient GI for evaluation.  02-05-2023 cards wants inpatient GI evaluation for anemia instead of outpatient evaluation that I had planned. GI has been consulted.  Chronic combined systolic and diastolic heart failure (HCC) 02-02-2023 prior echo by Dr. 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 Awaiting repeat echo. Start coreg due to elevated HR and BP.  Don't this he is in acute CHF. No peripheral edema. No rales. RA sats 100%.  Hold on entresto for now.  02-03-2023 awaiting  echo. Increase coreg to 6.25 mg bid.  02-04-2023 echo shows LVEF 20-25%. Similar to echo in 2018. In 2019,(while on GDMT) his LVEF had improved to 45-50%. Pt stopped taking CHF meds due to cost in 2019-2020 and lost to f/u.  Will need CHF consult today. Pt is euvolemic. IV lasix yesterday did not produce much of a diuresis.  Scr is up to 2.54 now.  Doubt his renal function will tolerate Entresto at this time.  He may need close f/u with CHF clinic for med titration. HR improved with coreg 6.25 mg bid  02-05-2023 going to cardiac cath today.  Iron deficiency anemia 02-02-2023 labs shows iron deficiency. He will need temporary po iron therapy at discharge and outpatient referral to GI for at least colonoscopy. Hemoccult negative at time of admission.  Iron/TIBC/Ferritin/ %Sat    Component Value Date/Time   IRON 22 (L) 02/01/2023 2304   TIBC 486 (H) 02/01/2023 2304   FERRITIN 9 (L) 02/01/2023 2304   IRONPCTSAT 5 (L) 02/01/2023 2304   02-05-2023 cards wants inpatient GI evaluation for anemia. GI has been consulted.  Elevated troponin 02-02-2023 admission EKG shows LVH. See "atypical CP". Awaiting echo  02-03-2023 awaiting echo  02-04-2023 echo shows LVEF 20-25%. Cards consult pending.  02-05-2023 going for cath today.  Atypical chest pain 02-02-2023 troponin elevated at admission but flat. Awaiting echo. Admission EKG shows LVH.    CKD Norman 3b, GFR 30-44 ml/min (HCC) 02-02-2023 last Scr was 04-2017 of 1.42. monitor Scr. Will need referral to outpatient nephrology. Check spot protein/creatinine ratio. UA today shows no protein in his urine.  02-03-2023 awaiting this AM CMP. Yesterday urine protein/urine Scr ratio was normal. No proteinuria. Spot Urine Protein: Urine Creatinine Ratio Lab Results  Component Value Date/Time   LABCREA 183 02/02/2023 08:15 AM   TOTPROTUR 21 02/02/2023 08:15 AM   PROTCRRATIO 0.11 02/02/2023 08:15 AM   02-05-2023 Scr 2.29. stable.  Essential hypertension 02-02-2023 start coreg 3.125 mg bid.  02-03-2023 increase coreg to 6.25 mg bid.  02-04-2023 BP and HR stable on coreg 6.25 mg bid.  02-05-2023 stable. On coreg 6.25 mg bid.   DVT prophylaxis: SCDs Start: 02/01/23 1933     Code Status: Full Code Family Communication: no family at bedside Disposition Plan:  return home Reason for continuing need for hospitalization: going for cardiac cath today.  Objective: Vitals:   02/05/23 0012 02/05/23 0418 02/05/23 0500 02/05/23 0759  BP: 102/62 121/86  126/89  Pulse: 81 82  87  Resp: 18     Temp:  97.8 F (36.6 C)  (!) 97.5 F (36.4 C)  TempSrc:  Oral  Oral  SpO2: 100% 100%  100%  Weight:   64.3 kg   Height:        Intake/Output Summary (Last 24 hours) at 02/05/2023 1117 Last data filed at 02/04/2023 2000 Gross per 24 hour  Intake 240 ml  Output --  Net 240 ml   Filed Weights   02/03/23 0336 02/04/23 0336 02/05/23 0500  Weight: 61.9 kg 61 kg 64.3 kg    Examination:  Physical Exam Vitals and nursing note reviewed.  Constitutional:      General: He is not in acute distress.    Appearance: He is normal weight. He is not toxic-appearing or diaphoretic.  HENT:     Head: Normocephalic and atraumatic.     Nose: Nose normal.  Eyes:     General: No scleral icterus. Cardiovascular:     Rate and Rhythm: Normal  rate and regular rhythm.     Heart sounds: Murmur heard.     Comments: No JVD Pulmonary:     Effort: Pulmonary effort is normal. No respiratory distress.     Breath sounds: Normal breath sounds. No wheezing.  Abdominal:     General: Abdomen is flat. Bowel sounds are normal. There is no distension.     Palpations: Abdomen is soft.  Musculoskeletal:     Right lower leg: No edema.     Left lower leg: No edema.  Skin:    General: Skin is warm and dry.     Capillary Refill: Capillary refill takes less than 2 seconds.  Neurological:     General: No focal deficit present.     Mental Status: He is alert and oriented to person, place, and time.     Data Reviewed: I have personally reviewed following labs and imaging studies  CBC: Recent Labs  Lab 02/01/23 1119 02/01/23 2304 02/02/23 0609 02/02/23 0844 02/03/23 0737 02/05/23 0832  WBC 7.0  --  9.1  --  10.0 9.7  NEUTROABS  --   --  6.9  --   --   --   HGB 6.3* 7.4*  7.1* 7.1* 9.4* 9.0*  HCT 22.9* 26.4* 24.1* 25.3* 32.1* 31.3*  MCV 69.8*  --  69.3*  --  70.7* 72.3*  PLT 287  --  254  --  288 255   Basic Metabolic Panel: Recent Labs  Lab 02/01/23 1119 02/01/23 2304 02/02/23 0609 02/03/23 0737 02/04/23 0622 02/05/23 0547 02/05/23 0832  NA 141  --  141 139 137 135  --   K 4.2  --  4.4 4.3 4.2 3.9  --   CL 107  --  108 105 102 103  --   CO2 25  --  23 23 26 22   --   GLUCOSE 100*  --  89 94 99 91  --   BUN 14  --  23* 26* 32* 34*  --   CREATININE 1.80*  --  1.98* 2.24* 2.54* 2.29*  --   CALCIUM 9.0  --  8.7* 9.0 8.7* 8.5*  --   MG  --  1.9 1.9  --   --   --  2.1   GFR: Estimated Creatinine Clearance: 31.6 mL/min (A) (by C-G formula based on SCr of 2.29 mg/dL (H)). Liver Function Tests: Recent Labs  Lab 02/02/23 0609 02/03/23 0737  AST 26 36  ALT 18 25  ALKPHOS 69 71  BILITOT 1.1 1.3*  PROT 5.4* 5.5*  ALBUMIN 2.9* 3.0*   Recent Labs  Lab 02/02/23 0609  LIPASE 37    Coagulation Profile: Recent Labs  Lab 02/01/23 2304  INR 1.2   BNP (last 3 results) Recent Labs    01/28/23 1451 02/01/23 1253 02/03/23 0737  BNP 1,106.2* 879.5* 2,756.5*   Scheduled Meds:  carvedilol  6.25 mg Oral BID WC   peg 3350 powder  0.5 kit Oral Once   And   peg 3350 powder  0.5 kit Oral Once   sodium chloride flush  10 mL Intravenous Q12H   Continuous Infusions:   LOS: 3 days   Time spent: 40 minutes  Carollee Herter, DO  [facility]  02/05/2023, 11:17 AM

## 2023-02-05 NOTE — H&P (View-Only) (Signed)
Consultation Note   Referring Provider:  [facility] PCP: Norman Jones, No Pcp Per Primary Gastroenterologist: Unassigned        Reason for Consultation: Iron deficiency anemia  DOA: 02/01/2023         Hospital Day: 5   ASSESSMENT    Brief Narrative:  57 y.o. year old male with nonischemic cardiomyopathy, chronic combined CHF , severe mitral regurgitation,  hypertension, CKD, tobacco abuse, THC use,   Chronic microcytosis, now with severe iron deficiency anemia and FOBT negative Presenting hemoglobin 6.3.  No overt GI bleeding. No recent labs but in 2018 his hemoglobin was 12.4  Nonischemic cardiomyopathy / severe MR / CHF with EF of 20-25% Diuretics stopped due to worsening renal failure. Plan is for eventual RHC when renal function improved and anemia evaluated.    CKD 3b Creatinin5 1.89 on admission, up to 2.29 today  Etoh abuse without evidence for cirrhosis.  Albumin 3, normal INR, normal platelets  Principal Problem:   Symptomatic anemia Active Problems:   Essential hypertension   Chronic combined systolic and diastolic heart failure (HCC)   CKD stage 3b, GFR 30-44 ml/min (HCC)   Atypical chest pain   Elevated troponin   Iron deficiency anemia     PLAN:   --EGD and colonoscopy if Cardiology feels he is safe for sedation / procedure. The risks and benefits of EGD and colonoscopy with possible biopsies / polypectomy were discussed with the Norman Jones who agrees to proceed.  --Keep on clears only for now.   HPI   Huber was admitted several days ago with squeezing chest pain  /chronic heart failure.   Found to be severely anemic with iron deficiency .  Hemoglobin 6.3.  No significant shortness of breath other than with exertion . [name] has not had any overt GI blood loss.  No black stools.  No abdominal pain.  No nausea, vomiting or other GI symptoms.  He used to take NSAIDs but none in several months.  He does not donate  blood.  No epistaxis.  No hematuria.  No family history of anemia as far as he knows.  Norman Jones tells me he has bad habits such as drinking too much alcohol.  He drinks a pint  of Etoh / day.    Notable labs / Imaging / Events this admission  :  Hemoglobin 6.3 >> now 9.4 after 2 units of RBCs Platelets 288 Ferritin 9 INR 1.2 Albumin 3.0, T. bili 1.3, remainder of LFTs normal BUN 34, creatinine 2.29 Chest x-ray - no active cardiopulmonary disease Chest CTA-no PE BNP 2756 High-sensitivity troponin 90   Previous GI Evaluations   none  Labs and Imaging: Recent Labs    02/03/23 0737  WBC 10.0  HGB 9.4*  HCT 32.1*  PLT 288   Recent Labs    02/03/23 0737 02/04/23 0622 02/05/23 0547  NA 139 137 135  K 4.3 4.2 3.9  CL 105 102 103  CO2 23 26 22   GLUCOSE 94 99 91  BUN 26* 32* 34*  CREATININE 2.24* 2.54* 2.29*  CALCIUM 9.0 8.7* 8.5*   Recent Labs    02/03/23 0737  PROT 5.5*  ALBUMIN 3.0*  AST 36  ALT 25  ALKPHOS 71  BILITOT  1.3*   No results for input(s): "HEPBSAG", "HCVAB", "HEPAIGM", "HEPBIGM" in the last 72 hours. No results for input(s): "LABPROT", "INR" in the last 72 hours.   Echocardiogram 02/03/2023: Impressions:  1. Severe MR (possible etiology malcoaptation from dilated LV).   2. Left ventricular ejection fraction, by estimation, is 20 to 25%. The  left ventricle has severely decreased function. The left ventricle  demonstrates global hypokinesis. The left ventricular internal cavity size  was severely dilated. There is mild  left ventricular hypertrophy. Left ventricular diastolic parameters are  consistent with Grade III diastolic dysfunction (restrictive).   3. Right ventricular systolic function is normal. The right ventricular  size is normal. There is severely elevated pulmonary artery systolic  pressure.   4. Left atrial size was severely dilated.   5. Right atrial size was moderately dilated.   6. PISA 0.7 cm. The mitral valve is normal in  structure. Severe mitral  valve regurgitation. No evidence of mitral stenosis.   7. Tricuspid valve regurgitation is moderate.   8. The aortic valve is normal in structure. Aortic valve regurgitation is  mild. No aortic stenosis is present.   9. The inferior vena cava is dilated in size with <50% respiratory  variability, suggesting right atrial pressure of 15 mmHg.   Comparison(s): No significant change from prior study. Prior images  reviewed side by side.     Past Medical History:  Diagnosis Date   Asthma    CHF (congestive heart failure) (HCC) 09/29/2012   Hypertension    Seasonal allergies     History reviewed. No pertinent surgical history.  Family History  Problem Relation Age of Onset   Cancer Mother        Breast cancer   Diabetes Maternal Grandmother     Prior to Admission medications   Medication Sig Start Date End Date Taking? Authorizing Provider  aspirin 325 MG tablet Take 650 mg by mouth daily as needed (decongestant).   Yes [provider]    Current Facility-Administered Medications  Medication Dose Route Frequency Provider Last Rate Last Admin   acetaminophen (TYLENOL) tablet 650 mg  650 mg Oral Q6H PRN Howerter, Justin B, DO       Or   acetaminophen (TYLENOL) suppository 650 mg  650 mg Rectal Q6H PRN Howerter, Justin B, DO       carvedilol (COREG) tablet 6.25 mg  6.25 mg Oral BID WC Carollee Herter, DO   6.25 mg at 02/04/23 0844   melatonin tablet 3 mg  3 mg Oral QHS PRN Howerter, Justin B, DO       ondansetron (ZOFRAN) injection 4 mg  4 mg Intravenous Q6H PRN Howerter, Justin B, DO       sodium chloride flush (NS) 0.9 % injection 10 mL  10 mL Intravenous Q12H Marjie Skiff E, PA-C   10 mL at 02/04/23 2100    Allergies as of 02/01/2023 - Review Complete 02/01/2023  Allergen Reaction Noted   Penicillins Other (See Comments) 05/29/2012    Social History   Socioeconomic History   Marital status: Single    Spouse name: Not on file   Number  of children: 0   Years of education: Not on file   Highest education level: Bachelor's degree (e.g., BA, AB, BS)  Occupational History   Occupation: IT Support  Tobacco Use   Smoking status: Every Day    Current packs/day: 0.50    Average packs/day: 0.5 packs/day for 39.0 years (19.5 ttl pk-yrs)  Types: Cigarettes    Start date: 02/04/1984   Smokeless tobacco: Never  Vaping Use   Vaping status: Never Used  Substance and Sexual Activity   Alcohol use: Yes    Comment: socially   Drug use: Yes    Types: Marijuana    Comment: 3 x per week   Sexual activity: Yes    Birth control/protection: None  Other Topics Concern   Not on file  Social History Narrative   Marital status: divorced      Children: none      Employment: Scientist, product/process development; works from home      Tobacco: 1 ppd      Alcohol:  3 ounces on weekends      Drugs:  None   Social Drivers of Corporate investment banker Strain: Low Risk  (02/04/2023)   Overall Financial Resource Strain (CARDIA)    Difficulty of Paying Living Expenses: Not very hard  Food Insecurity: No Food Insecurity (02/01/2023)   Hunger Vital Sign    Worried About Running Out of Food in the Last Year: Never true    Ran Out of Food in the Last Year: Never true  Transportation Needs: No Transportation Needs (02/04/2023)   PRAPARE - Administrator, Civil Service (Medical): No    Lack of Transportation (Non-Medical): No  Physical Activity: Not on file  Stress: Not on file  Social Connections: Not on file  Intimate Partner Violence: Not At Risk (02/01/2023)   Humiliation, Afraid, Rape, and Kick questionnaire    Fear of Current or Ex-Partner: No    Emotionally Abused: No    Physically Abused: No    Sexually Abused: No     Code Status   Code Status: Full Code  Review of Systems: All systems reviewed and negative except where noted in HPI.  Physical Exam: Vital signs in last 24 hours: Temp:  [97.5 F (36.4 C)-97.8 F (36.6 C)]  97.5 F (36.4 C) (12/17 0759) Pulse Rate:  [81-98] 87 (12/17 0759) Resp:  [18] 18 (12/17 0012) BP: (95-126)/(62-89) 126/89 (12/17 0759) SpO2:  [96 %-100 %] 100 % (12/17 0759) Weight:  [64.3 kg] 64.3 kg (12/17 0500) Last BM Date : 02/04/23  General:  Pleasant male in NAD Psych:  Cooperative. Normal mood and affect Eyes: Pupils equal Ears:  Normal auditory acuity Nose: No deformity, discharge or lesions Neck:  Supple, no masses felt Lungs:  Clear to auscultation.  Heart:  Regular rate, regular rhythm.  Abdomen:  Soft, nondistended, nontender, active bowel sounds, no masses felt Rectal :  Deferred Msk: Symmetrical without gross deformities.  Neurologic:  Alert, oriented, grossly normal neurologically Extremities : No edema Skin:  Intact without significant lesions.    Intake/Output from previous day: 12/16 0701 - 12/17 0700 In: 360 [P.O.:360] Out: -  Intake/Output this shift:  No intake/output data recorded.   Willette Cluster, NP-C   02/05/2023, 8:56 AM

## 2023-02-05 NOTE — Progress Notes (Signed)
MD states it was okay for patient to walk off the floor with staff.

## 2023-02-05 NOTE — Progress Notes (Signed)
Heart Failure Navigator Progress Note  Assessed for Heart & Vascular TOC clinic readiness.  Patient does not meet criteria due to Advanced Heart Failure Team consulted. Navigator will cancel original HF TOC appointment scheduled for 02/18/2023. .   Navigator will sign off at this time.   Rhae Hammock, BSN, Scientist, clinical (histocompatibility and immunogenetics) Only

## 2023-02-06 ENCOUNTER — Encounter (HOSPITAL_COMMUNITY)
Admission: EM | Disposition: A | Discharge status: Home / Self Care | Payer: Self-pay | Source: Home / Self Care | Attending: Internal Medicine

## 2023-02-06 ENCOUNTER — Other Ambulatory Visit: Payer: Self-pay | Admitting: Internal Medicine

## 2023-02-06 ENCOUNTER — Telehealth: Payer: Self-pay | Admitting: Pharmacy Technician

## 2023-02-06 ENCOUNTER — Encounter (HOSPITAL_COMMUNITY): Payer: Self-pay | Admitting: Internal Medicine

## 2023-02-06 ENCOUNTER — Inpatient Hospital Stay (HOSPITAL_COMMUNITY): Payer: BC Managed Care – PPO | Admitting: Registered Nurse

## 2023-02-06 ENCOUNTER — Telehealth: Payer: Self-pay

## 2023-02-06 DIAGNOSIS — K31819 Angiodysplasia of stomach and duodenum without bleeding: Secondary | ICD-10-CM

## 2023-02-06 DIAGNOSIS — I5043 Acute on chronic combined systolic (congestive) and diastolic (congestive) heart failure: Secondary | ICD-10-CM | POA: Diagnosis not present

## 2023-02-06 DIAGNOSIS — K5730 Diverticulosis of large intestine without perforation or abscess without bleeding: Secondary | ICD-10-CM | POA: Diagnosis not present

## 2023-02-06 DIAGNOSIS — K2980 Duodenitis without bleeding: Secondary | ICD-10-CM

## 2023-02-06 DIAGNOSIS — D120 Benign neoplasm of cecum: Secondary | ICD-10-CM | POA: Diagnosis not present

## 2023-02-06 DIAGNOSIS — N1832 Chronic kidney disease, stage 3b: Secondary | ICD-10-CM | POA: Diagnosis not present

## 2023-02-06 DIAGNOSIS — D123 Benign neoplasm of transverse colon: Secondary | ICD-10-CM

## 2023-02-06 DIAGNOSIS — I10 Essential (primary) hypertension: Secondary | ICD-10-CM | POA: Diagnosis not present

## 2023-02-06 DIAGNOSIS — D124 Benign neoplasm of descending colon: Secondary | ICD-10-CM | POA: Diagnosis not present

## 2023-02-06 DIAGNOSIS — K635 Polyp of colon: Secondary | ICD-10-CM | POA: Diagnosis not present

## 2023-02-06 DIAGNOSIS — D649 Anemia, unspecified: Secondary | ICD-10-CM | POA: Diagnosis not present

## 2023-02-06 DIAGNOSIS — B9681 Helicobacter pylori [H. pylori] as the cause of diseases classified elsewhere: Secondary | ICD-10-CM

## 2023-02-06 DIAGNOSIS — K648 Other hemorrhoids: Secondary | ICD-10-CM

## 2023-02-06 DIAGNOSIS — K31811 Angiodysplasia of stomach and duodenum with bleeding: Secondary | ICD-10-CM

## 2023-02-06 DIAGNOSIS — I5042 Chronic combined systolic (congestive) and diastolic (congestive) heart failure: Secondary | ICD-10-CM | POA: Diagnosis not present

## 2023-02-06 DIAGNOSIS — K2950 Unspecified chronic gastritis without bleeding: Secondary | ICD-10-CM

## 2023-02-06 DIAGNOSIS — D509 Iron deficiency anemia, unspecified: Secondary | ICD-10-CM | POA: Diagnosis not present

## 2023-02-06 HISTORY — PX: BIOPSY: SHX5522

## 2023-02-06 HISTORY — PX: HOT HEMOSTASIS: SHX5433

## 2023-02-06 HISTORY — PX: ESOPHAGOGASTRODUODENOSCOPY (EGD) WITH PROPOFOL: SHX5813

## 2023-02-06 HISTORY — PX: COLONOSCOPY WITH PROPOFOL: SHX5780

## 2023-02-06 HISTORY — PX: POLYPECTOMY: SHX5525

## 2023-02-06 LAB — POCT I-STAT EG7
Acid-base deficit: 4 mmol/L — ABNORMAL HIGH (ref 0.0–2.0)
Bicarbonate: 20.5 mmol/L (ref 20.0–28.0)
Calcium, Ion: 0.87 mmol/L — CL (ref 1.15–1.40)
HCT: 22 % — ABNORMAL LOW (ref 39.0–52.0)
Hemoglobin: 7.5 g/dL — ABNORMAL LOW (ref 13.0–17.0)
O2 Saturation: 63 %
Potassium: 3.1 mmol/L — ABNORMAL LOW (ref 3.5–5.1)
Sodium: 144 mmol/L (ref 135–145)
TCO2: 21 mmol/L — ABNORMAL LOW (ref 22–32)
pCO2, Ven: 34 mm[Hg] — ABNORMAL LOW (ref 44–60)
pH, Ven: 7.388 (ref 7.25–7.43)
pO2, Ven: 33 mm[Hg] (ref 32–45)

## 2023-02-06 LAB — BASIC METABOLIC PANEL
Anion gap: 9 (ref 5–15)
BUN: 27 mg/dL — ABNORMAL HIGH (ref 6–20)
CO2: 22 mmol/L (ref 22–32)
Calcium: 8.7 mg/dL — ABNORMAL LOW (ref 8.9–10.3)
Chloride: 108 mmol/L (ref 98–111)
Creatinine, Ser: 1.99 mg/dL — ABNORMAL HIGH (ref 0.61–1.24)
GFR, Estimated: 38 mL/min — ABNORMAL LOW (ref >60)
Glucose, Bld: 84 mg/dL (ref 70–99)
Potassium: 3.9 mmol/L (ref 3.5–5.1)
Sodium: 139 mmol/L (ref 135–145)

## 2023-02-06 LAB — CBC
HCT: 30.6 % — ABNORMAL LOW (ref 39.0–52.0)
Hemoglobin: 8.8 g/dL — ABNORMAL LOW (ref 13.0–17.0)
MCH: 21 pg — ABNORMAL LOW (ref 26.0–34.0)
MCHC: 28.8 g/dL — ABNORMAL LOW (ref 30.0–36.0)
MCV: 72.9 fL — ABNORMAL LOW (ref 80.0–100.0)
Platelets: 231 10*3/uL (ref 150–400)
RBC: 4.2 MIL/uL — ABNORMAL LOW (ref 4.22–5.81)
RDW: 22.6 % — ABNORMAL HIGH (ref 11.5–15.5)
WBC: 6.3 10*3/uL (ref 4.0–10.5)
nRBC: 0 % (ref 0.0–0.2)

## 2023-02-06 SURGERY — COLONOSCOPY WITH PROPOFOL
Anesthesia: Monitor Anesthesia Care

## 2023-02-06 MED ORDER — GLYCOPYRROLATE PF 0.2 MG/ML IJ SOSY
PREFILLED_SYRINGE | INTRAMUSCULAR | Status: DC | PRN
  Administered 2023-02-06: .1 mg via INTRAVENOUS

## 2023-02-06 MED ORDER — DAPAGLIFLOZIN PROPANEDIOL 10 MG PO TABS
10.0000 mg | ORAL_TABLET | Freq: Every day | ORAL | Status: DC
  Administered 2023-02-06 – 2023-02-07 (×2): 10 mg via ORAL
  Filled 2023-02-06 (×2): qty 1

## 2023-02-06 MED ORDER — LIDOCAINE 2% (20 MG/ML) 5 ML SYRINGE
INTRAMUSCULAR | Status: DC | PRN
  Administered 2023-02-06: 100 mg via INTRAVENOUS

## 2023-02-06 MED ORDER — PANTOPRAZOLE SODIUM 40 MG PO TBEC
40.0000 mg | DELAYED_RELEASE_TABLET | Freq: Every day | ORAL | Status: DC
  Administered 2023-02-07: 40 mg via ORAL
  Filled 2023-02-06: qty 1

## 2023-02-06 MED ORDER — PROPOFOL 10 MG/ML IV BOLUS
INTRAVENOUS | Status: DC | PRN
  Administered 2023-02-06: 30 mg via INTRAVENOUS
  Administered 2023-02-06: 100 ug/kg/min via INTRAVENOUS
  Administered 2023-02-06: 40 mg via INTRAVENOUS
  Administered 2023-02-06: 30 mg via INTRAVENOUS
  Administered 2023-02-06: 50 mg via INTRAVENOUS

## 2023-02-06 MED ORDER — PHENYLEPHRINE HCL (PRESSORS) 10 MG/ML IV SOLN
INTRAVENOUS | Status: DC | PRN
  Administered 2023-02-06: 120 ug via INTRAVENOUS

## 2023-02-06 SURGICAL SUPPLY — 24 items
BLOCK BITE 60FR ADLT L/F BLUE (MISCELLANEOUS) ×3 IMPLANT
ELECT REM PT RETURN 9FT ADLT (ELECTROSURGICAL)
ELECTRODE REM PT RTRN 9FT ADLT (ELECTROSURGICAL) IMPLANT
FCP BXJMBJMB 240X2.8X (CUTTING FORCEPS)
FLOOR PAD 36X40 (MISCELLANEOUS) ×2
FORCEP RJ3 GP 1.8X160 W-NEEDLE (CUTTING FORCEPS) IMPLANT
FORCEPS BIOP RAD 4 LRG CAP 4 (CUTTING FORCEPS) IMPLANT
FORCEPS BIOP RJ4 240 W/NDL (CUTTING FORCEPS)
FORCEPS BXJMBJMB 240X2.8X (CUTTING FORCEPS) IMPLANT
INJECTOR/SNARE I SNARE (MISCELLANEOUS) IMPLANT
LUBRICANT JELLY 4.5OZ STERILE (MISCELLANEOUS) IMPLANT
MANIFOLD NEPTUNE II (INSTRUMENTS) IMPLANT
NDL SCLEROTHERAPY 25GX240 (NEEDLE) IMPLANT
NEEDLE SCLEROTHERAPY 25GX240 (NEEDLE) IMPLANT
PAD FLOOR 36X40 (MISCELLANEOUS) ×3 IMPLANT
PROBE APC STR FIRE (PROBE) IMPLANT
PROBE INJECTION GOLD 7FR (MISCELLANEOUS) IMPLANT
SNARE ROTATE MED OVAL 20MM (MISCELLANEOUS) IMPLANT
SNARE SHORT THROW 13M SML OVAL (MISCELLANEOUS) IMPLANT
SYR 50ML LL SCALE MARK (SYRINGE) IMPLANT
TRAP SPECIMEN MUCOUS 40CC (MISCELLANEOUS) IMPLANT
TUBING ENDO SMARTCAP PENTAX (MISCELLANEOUS) ×6 IMPLANT
TUBING IRRIGATION ENDOGATOR (MISCELLANEOUS) ×3 IMPLANT
WATER STERILE IRR 1000ML POUR (IV SOLUTION) IMPLANT

## 2023-02-06 NOTE — Interval H&P Note (Signed)
History and Physical Interval Note: For upper endoscopy and colonoscopy to evaluate iron deficiency anemia, heme-negative on presentation The nature of the procedure, as well as the risks, benefits, and alternatives were carefully and thoroughly reviewed with the patient. Ample time for discussion and questions allowed. The patient understood, was satisfied, and agreed to proceed.      Latest Ref Rng & Units 02/06/2023    6:21 AM 02/05/2023    4:00 PM 02/05/2023    2:14 PM  CBC  WBC 4.0 - 10.5 K/uL 6.3  8.2    Hemoglobin 13.0 - 17.0 g/dL 8.8  9.2  9.5   Hematocrit 39.0 - 52.0 % 30.6  31.4  28.0   Platelets 150 - 400 K/uL 231  272       02/06/2023 8:55 AM  [name]  has presented today for surgery, with the diagnosis of iron deficiency anemia.  The various methods of treatment have been discussed with the patient and family. After consideration of risks, benefits and other options for treatment, the patient has consented to  Procedure(s): COLONOSCOPY WITH PROPOFOL (N/A) ESOPHAGOGASTRODUODENOSCOPY (EGD) WITH PROPOFOL (N/A) as a surgical intervention.  The patient's history has been reviewed, patient examined, no change in status, stable for surgery.  I have reviewed the patient's chart and labs.  Questions were answered to the patient's satisfaction.     Norman Jones Norman Jones

## 2023-02-06 NOTE — Telephone Encounter (Signed)
Pt scheduled to see Doug Sou PA 04/09/23 at 11:30am. Appt letter mailed to pt.

## 2023-02-06 NOTE — Op Note (Signed)
[facility] Patient Name: Norman Jones Procedure Date : 02/06/2023 MRN: [id number] Attending MD: Beverley Fiedler , MD, [id number] Date of Birth: 04/10/1965 CSN: [id number] Age: 57 Admit Type: Inpatient Procedure:                Upper GI endoscopy Indications:              Iron deficiency anemia, heme negative on admission Providers:                Carie Caddy. Rhea Belton, MD, Glory Rosebush, RN, Kandice Robinsons, Technician Referring MD:             [facility] Medicines:                Monitored Anesthesia Care Complications:            No immediate complications. Estimated Blood Loss:     Estimated blood loss was minimal. Procedure:                Pre-Anesthesia Assessment:                           - Prior to the procedure, a History and Physical                            was performed, and patient medications and                            allergies were reviewed. The patient's tolerance of                            previous anesthesia was also reviewed. The risks                            and benefits of the procedure and the sedation                            options and risks were discussed with the patient.                            All questions were answered, and informed consent                            was obtained. Prior Anticoagulants: The patient has                            taken no anticoagulant or antiplatelet agents. ASA                            Grade Assessment: III - A patient with severe                            systemic disease. After reviewing the risks and  benefits, the patient was deemed in satisfactory                            condition to undergo the procedure.                           After obtaining informed consent, the endoscope was                            passed under direct vision. Throughout the                            procedure, the patient's blood pressure,  pulse, and                            oxygen saturations were monitored continuously. The                            [id number]-[id number] ([id number]) Olympus endoscope was introduced                            through the mouth, and advanced to the second part                            of duodenum. The upper GI endoscopy was                            accomplished without difficulty. The patient                            tolerated the procedure well. Scope In: Scope Out: Findings:      The examined esophagus was normal.      A single 2 mm angioectasia with no bleeding was found in the gastric       fundus. Fulguration to ablate the lesion to prevent bleeding by argon       plasma at 1 liter/minute and 20 watts was successful.      The exam of the stomach was otherwise normal.      Biopsies were taken with a cold forceps in the gastric body, at the       incisura and in the gastric antrum for Helicobacter pylori testing.      The examined duodenum was normal. Biopsies for histology were taken with       a cold forceps for evaluation of celiac disease. Impression:               - Normal esophagus.                           - A single non-bleeding angioectasia in the                            stomach. Treated with argon plasma coagulation                            (APC).                           -  Normal examined duodenum. Biopsied.                           - Biopsies were taken with a cold forceps for                            Helicobacter pylori testing. Moderate Sedation:      N/A Recommendation:           - Return patient to hospital ward for ongoing care.                           - Advance diet as tolerated.                           - Continue present medications.                           - Replace iron and monitor Hgb and iron studies                            after discharge to ensure improvement.                           - Await pathology results.                           - See  the other procedure note for documentation of                            additional recommendations. Procedure Code(s):        --- Professional ---                           [phone], 59, Esophagogastroduodenoscopy, flexible,                            transoral; with control of bleeding, any method                           43239, Esophagogastroduodenoscopy, flexible,                            transoral; with biopsy, single or multiple Diagnosis Code(s):        --- Professional ---                           J47.829, Angiodysplasia of stomach and duodenum                            without bleeding                           D50.9, Iron deficiency anemia, unspecified CPT copyright 2022 American [employer] Association. All rights reserved. The codes documented in this report are preliminary and upon coder review may  be revised to meet current compliance requirements. Beverley Fiedler, MD 02/06/2023 9:32:32 AM This  report has been signed electronically. Number of Addenda: 0

## 2023-02-06 NOTE — Op Note (Signed)
[facility] Patient Name: Norman Jones Procedure Date : 02/06/2023 MRN: [id number] Attending MD: Beverley Fiedler , MD, [id number] Date of Birth: April 18, 1965 CSN: [id number] Age: 57 Admit Type: Inpatient Procedure:                Colonoscopy Indications:              Iron deficiency anemia Providers:                Carie Caddy. Rhea Belton, MD, Glory Rosebush, RN, Kandice Robinsons, Technician Referring MD:             [facility] Medicines:                Monitored Anesthesia Care Complications:            No immediate complications. Estimated Blood Loss:     Estimated blood loss: none. Procedure:                Pre-Anesthesia Assessment:                           - Prior to the procedure, a History and Physical                            was performed, and patient medications and                            allergies were reviewed. The patient's tolerance of                            previous anesthesia was also reviewed. The risks                            and benefits of the procedure and the sedation                            options and risks were discussed with the patient.                            All questions were answered, and informed consent                            was obtained. Prior Anticoagulants: The patient has                            taken no anticoagulant or antiplatelet agents. ASA                            Grade Assessment: III - A patient with severe                            systemic disease. After reviewing the risks and  benefits, the patient was deemed in satisfactory                            condition to undergo the procedure.                           After obtaining informed consent, the colonoscope                            was passed under direct vision. Throughout the                            procedure, the patient's blood pressure, pulse, and                             oxygen saturations were monitored continuously. The                            [id number]-HQ190TL ([id number] Olympus peds colonoscope was                            introduced through the anus and advanced to the                            cecum, identified by appendiceal orifice and                            ileocecal valve. The colonoscopy was performed                            without difficulty. The patient tolerated the                            procedure well. The quality of the bowel                            preparation was good. The ileocecal valve,                            appendiceal orifice, and rectum were photographed. Scope In: 9:35:43 AM Scope Out: 9:51:09 AM Scope Withdrawal Time: 0 hours 13 minutes 56 seconds  Total Procedure Duration: 0 hours 15 minutes 26 seconds  Findings:      The digital rectal exam was normal.      A 2 mm polyp was found in the cecum. The polyp was sessile. The polyp       was removed with a cold biopsy forceps. Resection and retrieval were       complete.      A 5 mm polyp was found in the cecum. The polyp was sessile. The polyp       was removed with a cold snare. Resection and retrieval were complete.      A 7 mm polyp was found in the transverse colon. The polyp was sessile.       The polyp was removed with a cold snare. Resection and retrieval were  complete.      A 5 mm polyp was found in the descending colon. The polyp was sessile.       The polyp was removed with a cold snare. Resection and retrieval were       complete.      Multiple medium-mouthed and small-mouthed diverticula were found in the       sigmoid colon and descending colon.      Internal hemorrhoids were found during retroflexion. The hemorrhoids       were small. Impression:               - One 2 mm polyp in the cecum, removed with a cold                            biopsy forceps. Resected and retrieved.                           - One 5 mm polyp in the cecum, removed  with a cold                            snare. Resected and retrieved.                           - One 7 mm polyp in the transverse colon, removed                            with a cold snare. Resected and retrieved.                           - One 5 mm polyp in the descending colon, removed                            with a cold snare. Resected and retrieved.                           - Mild diverticulosis in the sigmoid colon and in                            the descending colon.                           - Internal hemorrhoids. Moderate Sedation:      N/A Recommendation:           - Return patient to hospital ward for ongoing care.                           - Advance diet as tolerated.                           - Continue present medications.                           - Await pathology results.                           -  Repeat colonoscopy is recommended for                            surveillance. The colonoscopy date will be                            determined after pathology results from today's                            exam become available for review.                           - See the other procedure note for documentation of                            additional recommendations (gastric angioectasia). Procedure Code(s):        --- Professional ---                           [phone], Colonoscopy, flexible; with removal of                            tumor(s), polyp(s), or other lesion(s) by snare                            technique                           45380, 59, Colonoscopy, flexible; with biopsy,                            single or multiple Diagnosis Code(s):        --- Professional ---                           D12.0, Benign neoplasm of cecum                           D12.3, Benign neoplasm of transverse colon (hepatic                            flexure or splenic flexure)                           D12.4, Benign neoplasm of descending colon                            K64.8, Other hemorrhoids                           D50.9, Iron deficiency anemia, unspecified                           K57.30, Diverticulosis of large intestine without                            perforation or abscess without  bleeding CPT copyright 2022 American Medical Association. All rights reserved. The codes documented in this report are preliminary and upon coder review may  be revised to meet current compliance requirements. Beverley Fiedler, MD 02/06/2023 9:56:15 AM This report has been signed electronically. Number of Addenda: 0

## 2023-02-06 NOTE — Progress Notes (Signed)
IDA, gastric angioectasia Currently hospitalized IV iron as outpt please JMP

## 2023-02-06 NOTE — Anesthesia Postprocedure Evaluation (Signed)
Anesthesia Post Note  Patient: Norman Jones.  Procedure(s) Performed: COLONOSCOPY WITH PROPOFOL ESOPHAGOGASTRODUODENOSCOPY (EGD) WITH PROPOFOL HOT HEMOSTASIS (ARGON PLASMA COAGULATION/BICAP) BIOPSY POLYPECTOMY     Patient location during evaluation: PACU Anesthesia Type: MAC Level of consciousness: awake and alert Pain management: pain level controlled Vital Signs Assessment: post-procedure vital signs reviewed and stable Respiratory status: spontaneous breathing, nonlabored ventilation, respiratory function stable and patient connected to nasal cannula oxygen Cardiovascular status: stable and blood pressure returned to baseline Postop Assessment: no apparent nausea or vomiting Anesthetic complications: no   There were no known notable events for this encounter.  Last Vitals:  Vitals:   02/06/23 1030 02/06/23 1100  BP: 119/78 120/82  Pulse: 66   Resp: 17   Temp: (!) 36.4 C   SpO2: 96%     Last Pain:  Vitals:   02/06/23 1030  TempSrc:   PainSc: 0-No pain                 Mariann Barter

## 2023-02-06 NOTE — Progress Notes (Addendum)
Advanced Heart Failure Rounding Note  Cardiologist: Rollene Rotunda, MD   Subjective:     Feeling well. No dyspnea, orthopnea or PND.    Objective:   Weight Range: 60 kg Body mass index is 19.52 kg/m.   Vital Signs:   Temp:  [97.2 F (36.2 C)-98 F (36.7 C)] 97.5 F (36.4 C) (12/18 1030) Pulse Rate:  [59-82] 66 (12/18 1030) Resp:  [13-38] 17 (12/18 1030) BP: (108-138)/(60-90) 119/78 (12/18 1030) SpO2:  [96 %-100 %] 96 % (12/18 1030) Weight:  [60 kg] 60 kg (12/18 0500) Last BM Date : 02/04/23  Weight change: Filed Weights   02/04/23 0336 02/05/23 0500 02/06/23 0500  Weight: 61 kg 64.3 kg 60 kg    Intake/Output:   Intake/Output Summary (Last 24 hours) at 02/06/2023 1056 Last data filed at 02/06/2023 1039 Gross per 24 hour  Intake 1285 ml  Output --  Net 1285 ml      Physical Exam    General:  Well appearing.  HEENT: Normal Neck: Supple. JVP not elevated.  Cor: PMI nondisplaced. Regular rate & rhythm. No rubs, gallops, 2/6 holosystolic murmur Lungs: Clear Abdomen: Soft, nontender, nondistended.  Extremities: No cyanosis, clubbing, rash, edema Neuro: Alert & orientedx3. Affect pleasant   Telemetry   SR 70s   Labs    CBC Recent Labs    02/05/23 1600 02/06/23 0621  WBC 8.2 6.3  HGB 9.2* 8.8*  HCT 31.4* 30.6*  MCV 71.9* 72.9*  PLT 272 231   Basic Metabolic Panel Recent Labs    16/10/96 0547 02/05/23 0832 02/05/23 1333 02/05/23 1414 02/05/23 1600 02/06/23 0621  NA 135  --    < > 137  --  139  K 3.9  --    < > 3.8  --  3.9  CL 103  --   --   --   --  108  CO2 22  --   --   --   --  22  GLUCOSE 91  --   --   --   --  84  BUN 34*  --   --   --   --  27*  CREATININE 2.29*  --   --   --  2.20* 1.99*  CALCIUM 8.5*  --   --   --   --  8.7*  MG  --  2.1  --   --   --   --    < > = values in this interval not displayed.   Liver Function Tests No results for input(s): "AST", "ALT", "ALKPHOS", "BILITOT", "PROT", "ALBUMIN" in the last  72 hours. No results for input(s): "LIPASE", "AMYLASE" in the last 72 hours. Cardiac Enzymes No results for input(s): "CKTOTAL", "CKMB", "CKMBINDEX", "TROPONINI" in the last 72 hours.  BNP: BNP (last 3 results) Recent Labs    02/01/23 1253 02/03/23 0737 02/05/23 0828  BNP 879.5* 2,756.5* 1,010.9*    ProBNP (last 3 results) No results for input(s): "PROBNP" in the last 8760 hours.   D-Dimer No results for input(s): "DDIMER" in the last 72 hours. Hemoglobin A1C No results for input(s): "HGBA1C" in the last 72 hours. Fasting Lipid Panel No results for input(s): "CHOL", "HDL", "LDLCALC", "TRIG", "CHOLHDL", "LDLDIRECT" in the last 72 hours. Thyroid Function Tests No results for input(s): "TSH", "T4TOTAL", "T3FREE", "THYROIDAB" in the last 72 hours.  Invalid input(s): "FREET3"  Other results:   Imaging    CARDIAC CATHETERIZATION Result Date: 02/05/2023 Findings: RA = 3 RV =  36/4 PA = 34/11 (23) PCW = 12 Fick cardiac output/index = 5.5/3.1 TC CO/CI = 4.5/2.5 PVR = 0.8 WU Ao sat = 99% PA sat = 63%, 65% PAPi = 7.7 Assessment: 1. Normal hemodynamics Arvilla Meres, MD 3:07 PM    Medications:     Scheduled Medications:  [START ON 02/07/2023] pantoprazole  40 mg Oral QAC breakfast   sodium chloride flush  3 mL Intravenous Q12H    Infusions:  sodium chloride      PRN Medications: sodium chloride, acetaminophen **OR** acetaminophen, melatonin, ondansetron (ZOFRAN) IV, sodium chloride flush    Patient Profile  Norman Jones. is a 56 y.o. male with a history of NICM/chronic HFimpEF  (EF as low as 20-25% in 2018 but improved to 45-50% on last Echo in 03/2017), hypertension, CKD stage IIIb, and asthma. Admitted with acute on chronic systolic CHF and severe iron deficiency anemia.     Assessment/Plan   Acute on Chronic Systolic and Diastolic Heart Failure - Etiology uncertain. NICM 2014 EF 27%. No known family history. Heavy ETOH use 1/5th/day. Now with progressive  valvular disease and MR. Of note has had elevated pulmonary pressures noted by Echo since 2018.  - Echo 2/18: EF 20-25% w severe RV dysfunction, mod MR and TR, mod to severe PA peak 67 - Echo 2/19: EF 45-50% with mild to mod MR. - Echo 12/24: EF 20-25%, gHK, G3DD, RV nl, severe MR, mod TR, RVSP 61.8.  - RHC 02/05/23: Normal hemodynamics - Ischemic workup deferred d/t renal function - Will try to arrange for cMRI to be done today - Start Farxiga 10 mg daily - NYHA I. Does not need diuretics.  - Could consider low dose imdur/hydralazine but do not want to induce hypotension while waiting for renal function to improve - GDMT limited by renal function  2. 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  - RHC this admit reassuring: RA 3, PA 34/11 (23), PCWP 12, PAPi 7.7   3. AKI on CKDIII - baseline sCr ~1.4-1.8 back in 2019, new baseline not certain - On admit 1.8 - sCr peaked at 2.5, 2 today - Follow BMET   4. Mitral Regurgitation - severe on Echo 12/24  - May eventually be a clip candidate but needs ischemic workup first   5. Tricuspid Regurgitation - mod on Echo 9/19 and 12/24   6. Iron Deficiency Anemia - Ferritin 9 and T sat 5%. Hgb 6.3 on admission - Received 2 u RBCs. Hgb up to 8.8.  - GI following. EGD: Single angioectasia in stomach treated with APC.Colonoscopy: Polyps removed - Can get IV iron after cardiac MRI   7. Alcohol abuse - Drinking 1/5th of liquor a day - Cessation discussed. He is determined to quit   8. Current smoker - 0.5 ppd  - He states he plans to quit   Cardiac MRI today.   Likely ready for discharge home tomorrow. Will arrange for follow-up in HF clinic.   Length of Stay: 4  Erina Hamme N, PA-C  02/06/2023, 10:56 AM  Advanced Heart Failure Team Pager [phone] (M-F; 7a - 5p)  Please contact [facility] for night-coverage after hours (5p  -7a ) and weekends on [email or web address]

## 2023-02-06 NOTE — Telephone Encounter (Addendum)
Dr. Rhea Belton,  Iron orders are un-signed. Please sign orders and we will get patient scheduled as soon as possible. Preferred medication is Venofer. Would you like to use Venofer??  Auth Submission:  Site of care: Site of care: [facility] Payer:  Medication & CPT/J Code(s) submitted: Feraheme (ferumoxytol) [id number] Route of submission (phone, fax, portal):  Phone # Fax # Auth type: Buy/Bill PB Units/visits requested:  Reference number:  Approval from:  to

## 2023-02-06 NOTE — Anesthesia Preprocedure Evaluation (Signed)
Anesthesia Evaluation  Patient identified by MRN, date of birth, ID band Patient awake    Reviewed: Allergy & Precautions, NPO status , Patient's Chart, lab work & pertinent test results, reviewed documented beta blocker date and time   History of Anesthesia Complications Negative for: history of anesthetic complications  Airway Mallampati: II  TM Distance: >3 FB Neck ROM: Full    Dental no notable dental hx.    Pulmonary asthma , neg COPD, Current Smoker   breath sounds clear to auscultation       Cardiovascular hypertension, +CHF  (-) CAD, (-) Past MI and (-) Cardiac Stents  Rhythm:Regular Rate:Normal     Neuro/Psych neg Seizures    GI/Hepatic ,neg GERD  ,,(+)     substance abuse  alcohol use  Endo/Other    Renal/GU CRFRenal disease     Musculoskeletal   Abdominal   Peds  Hematology  (+) Blood dyscrasia, anemia   Anesthesia Other Findings   Reproductive/Obstetrics                             Anesthesia Physical Anesthesia Plan  ASA: 3  Anesthesia Plan: MAC   Post-op Pain Management:    Induction: Intravenous  PONV Risk Score and Plan: 2 and Ondansetron  Airway Management Planned: Natural Airway and Nasal Cannula  Additional Equipment:   Intra-op Plan:   Post-operative Plan:   Informed Consent: I have reviewed the patients History and Physical, chart, labs and discussed the procedure including the risks, benefits and alternatives for the proposed anesthesia with the patient or authorized representative who has indicated his/her understanding and acceptance.     Dental advisory given  Plan Discussed with:   Anesthesia Plan Comments:        Anesthesia Quick Evaluation

## 2023-02-06 NOTE — Procedures (Addendum)
Patient with iron deficiency anemia likely from chronic blood loss Gastric angioectasia found today See EGD and colonoscopy for full description of findings  IV iron is recommended He does not have PCP He needs a PCP  I will order IV iron from the outpatient infusion clinic and then recommend that he follow-up at least once after IV iron with GI to monitor response to IV iron replacement  GI will sign off, call if questions

## 2023-02-06 NOTE — Transfer of Care (Signed)
Immediate Anesthesia Transfer of Care Note  Patient: Norman Jones.  Procedure(s) Performed: COLONOSCOPY WITH PROPOFOL ESOPHAGOGASTRODUODENOSCOPY (EGD) WITH PROPOFOL HOT HEMOSTASIS (ARGON PLASMA COAGULATION/BICAP) BIOPSY POLYPECTOMY  Patient Location: PACU  Anesthesia Type:MAC  Level of Consciousness: awake, alert , and oriented  Airway & Oxygen Therapy: Patient Spontanous Breathing  Post-op Assessment: Report given to RN and Post -op Vital signs reviewed and stable  Post vital signs: Reviewed and stable  Last Vitals:  Vitals Value Taken Time  BP 108/73 02/06/23 0957  Temp    Pulse 78   Resp 21 02/06/23 0958  SpO2 99   Vitals shown include unfiled device data.  Last Pain:  Vitals:   02/06/23 0840  TempSrc: Temporal  PainSc: 0-No pain         Complications: There were no known notable events for this encounter.

## 2023-02-06 NOTE — Progress Notes (Signed)
PROGRESS NOTE    [name]  XBM:[id number] DOB: 04/20/1965 DOA: 02/01/2023 PCP: Patient, No Pcp Per  Subjective: Pt seen and examined. Pt seen and examined. RHC yesterday showed normal hemodynamics. PCW 12, cardiac output 5.5 L/min  Pt tolerated colonoscopy prep. For EGD/Colonoscopy today.  He wants to know if he can go home today after EGD/colonoscopy. Discussed that would be up to CHF/Cards team, but I did not have any reason to keep him longer in the hospital.   Hospital Course: HPI:  Alfonso Arroyos. is a 57 y.o. male with medical history significant for chronic systolic/diastolic heart failure with most recent LVEF 45 to 50%, stage IIIb CKD associated baseline creatinine range 1.4-1.9, who is admitted to [facility] on 02/01/2023 with symptomatic anemia after presenting from home to [facility] ED complaining of shortness of breath.    The patient reports 4 to 5 days of intermittent shortness of breath, worse with exertion, in the absence of any orthopnea, PND, or worsening of edema in the lower extremities.  Denies any associated subjective fever, chills, rigors, or generalized myalgias.  No recent cough, hemoptysis, new lower extremity erythema or new calf tenderness.  No wheezing.    He also reports 2 episodes of sharp, nonradiating left-sided chest discomfort over the last day.  But these episodes have occurred at rest at did not intensify with exertion.  Both episodes lasted for less than 10 minutes before spontaneously resolving in the absence of any nitroglycerin.  First episode occurred while at rest on the morning of 02/01/2023, with the second such episode occurring while at rest at work.  Denies any associated palpitations, diaphoresis, nausea, vomiting, dizziness, presyncope, or syncope.   He conveys that it has been several years since he has previously seen a physician.  Most recent prior hemoglobin was found to be 12.4 in July 2018.  Not on any blood thinners as an  outpatient.  Denies any recent abdominal pain prn or any recent melena or hematochezia.  No recent hematemesis or gross hematuria.  Significant Events: Admitted 02/01/2023 for symptomatic anemia   Significant Labs: Admission HgB 6.3, MCV 69.8, BUN 14, Scr 1.8 BNP 879 Iron 22(low), TIBC 486(high), %sat 5(low), ferritin 9(low) B12 304(nl) Folate 11.4(nl)  Significant Imaging Studies: Admission CTPA negative for PE Echo shows LVEF 20-25%  Antibiotic Therapy: Anti-infectives (From admission, onward)    None       Procedures: 02-05-2023 RHC shows normal hemodynamics. Cardiac output 5.4 L/min, wedge 12  Consultants: Cardiology GI    Assessment and Plan: * Symptomatic anemia 02-02-2023 transfused with 1 unit PRBC last night. Labs reveal iron deficiency. He will need outpatient GI evaluation at least colonoscopy. Check UA to ensure no hematuria. Repeat HgB 7.1 this AM. Will give 1 more unit or PRBC today. Give 40 mg IV lasix prior to transfusion.  02-03-2023 HgB 9.4 g/dl after 2 unit PRBC transfusion. Outpatient GI evaluation.  02-04-2023 will refer to outpatient GI for evaluation.  02-05-2023 cards wants inpatient GI evaluation for anemia instead of outpatient evaluation that I had planned. GI has been consulted.  02-06-2023 for EGD/colonoscopy today.   Chronic combined systolic and diastolic heart failure (HCC) 02-02-2023 prior echo by Dr. 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 Awaiting repeat echo. Start coreg due to elevated HR and BP.  Don't this he is in acute CHF. No peripheral edema. No rales. RA sats 100%.  Hold on entresto for now.  02-03-2023 awaiting echo. Increase coreg to 6.25  mg bid.  02-04-2023 echo shows LVEF 20-25%. Similar to echo in 2018. In 2019,(while on GDMT) his LVEF had improved to 45-50%. Pt stopped taking CHF meds due to cost in 2019-2020 and lost to f/u.  Will need CHF consult today. Pt is euvolemic. IV lasix yesterday did not produce much of a  diuresis. Scr is up to 2.54 now.  Doubt his renal function will tolerate Entresto at this time.  He may need close f/u with CHF clinic for med titration. HR improved with coreg 6.25 mg bid  02-05-2023 going to cardiac cath today.  02-06-2023 RHC yesterday showed normal hemodynamics. PCW was 12. BNP yesterday was 1010. This is probably his baseline. Dry weight around 132-134 lbs. Awaiting CHF/cards team to decide on discharge GDMT meds.  Iron deficiency anemia 02-02-2023 labs shows iron deficiency. He will need temporary po iron therapy at discharge and outpatient referral to GI for at least colonoscopy. Hemoccult negative at time of admission.  Iron/TIBC/Ferritin/ %Sat    Component Value Date/Time   IRON 22 (L) 02/01/2023 2304   TIBC 486 (H) 02/01/2023 2304   FERRITIN 9 (L) 02/01/2023 2304   IRONPCTSAT 5 (L) 02/01/2023 2304   02-05-2023 cards wants inpatient GI evaluation for anemia. GI has been consulted. 02-06-2023 for EGD/colon today.  Elevated troponin 02-02-2023 admission EKG shows LVH. See "atypical CP". Awaiting echo  02-03-2023 awaiting echo  02-04-2023 echo shows LVEF 20-25%. Cards consult pending.  02-05-2023 going for cath today.  Atypical chest pain 02-02-2023 troponin elevated at admission but flat. Awaiting echo. Admission EKG shows LVH.    CKD stage 3b, GFR 30-44 ml/min (HCC) 02-02-2023 last Scr was 04-2017 of 1.42. monitor Scr. Will need referral to outpatient nephrology. Check spot protein/creatinine ratio. UA today shows no protein in his urine.  02-03-2023 awaiting this AM CMP. Yesterday urine protein/urine Scr ratio was normal. No proteinuria. Spot Urine Protein: Urine Creatinine Ratio Lab Results  Component Value Date/Time   LABCREA 183 02/02/2023 08:15 AM   TOTPROTUR 21 02/02/2023 08:15 AM   PROTCRRATIO 0.11 02/02/2023 08:15 AM   02-05-2023 Scr 2.29. stable.  02-06-2023 Scr down to 1.99, BUN 27  Essential hypertension 02-02-2023 start coreg 3.125  mg bid.  02-03-2023 increase coreg to 6.25 mg bid.  02-04-2023 BP and HR stable on coreg 6.25 mg bid.  02-05-2023 stable. On coreg 6.25 mg bid. 02-06-2023 stable.       DVT prophylaxis: SCDs Start: 02/01/23 1933     Code Status: Full Code Family Communication: no family at bedside Disposition Plan: return home Reason for continuing need for hospitalization: for EGD/colonoscopy today. Awaiting CHF/cards team f/u note.  Objective: Vitals:   02/06/23 0500 02/06/23 0544 02/06/23 0741 02/06/23 0840  BP:  112/61 128/89 138/80  Pulse:  77 82 70  Resp:  18  20  Temp:  (!) 97.3 F (36.3 C) (!) 97.5 F (36.4 C) 97.7 F (36.5 C)  TempSrc:  Oral Oral Temporal  SpO2:  100% 100% 99%  Weight: 60 kg     Height:        Intake/Output Summary (Last 24 hours) at 02/06/2023 0941 Last data filed at 02/05/2023 2200 Gross per 24 hour  Intake 960 ml  Output --  Net 960 ml   Filed Weights   02/04/23 0336 02/05/23 0500 02/06/23 0500  Weight: 61 kg 64.3 kg 60 kg   Weight Information (since admission)     Date/Time Weight Weight in lbs BSA (Calculated - sq m) BMI (Calculated) Who  02/06/23 0500 60 kg 132.2 lbs -- 19.51 [place]   02/05/23 0500 64.3 kg 141.76 lbs -- 20.92 [place]   02/04/23 0336 61 kg 134.48 lbs -- 19.85 JC   02/03/23 03:36:08 61.9 kg 136.47 lbs -- 20.14 JC   02/02/23 0300 63.4 kg 139.77 lbs -- 20.63 GT   02/01/23 1109 68 kg 150 lbs 1.82 sq meters 22.14 [place]       Examination:  Physical Exam Vitals and nursing note reviewed.  Constitutional:      General: He is not in acute distress.    Appearance: He is not ill-appearing, toxic-appearing or diaphoretic.  HENT:     Head: Normocephalic and atraumatic.  Cardiovascular:     Rate and Rhythm: Normal rate and regular rhythm.     Heart sounds: Murmur heard.     Comments: No JVD Pulmonary:     Effort: Pulmonary effort is normal. No respiratory distress.     Breath sounds: Normal breath sounds.  Abdominal:     General:  Abdomen is flat. Bowel sounds are normal.     Palpations: Abdomen is soft.  Musculoskeletal:     Right lower leg: No edema.     Left lower leg: No edema.  Skin:    General: Skin is warm and dry.     Capillary Refill: Capillary refill takes less than 2 seconds.  Neurological:     Mental Status: He is alert and oriented to person, place, and time.     Data Reviewed: I have personally reviewed following labs and imaging studies  CBC: Recent Labs  Lab 02/02/23 0609 02/02/23 0844 02/03/23 0737 02/05/23 0832 02/05/23 1333 02/05/23 1414 02/05/23 1600 02/06/23 0621  WBC 9.1  --  10.0 9.7  --   --  8.2 6.3  NEUTROABS 6.9  --   --   --   --   --   --   --   HGB 7.1*   < > 9.4* 9.0* 7.5* 9.5* 9.2* 8.8*  HCT 24.1*   < > 32.1* 31.3* 22.0* 28.0* 31.4* 30.6*  MCV 69.3*  --  70.7* 72.3*  --   --  71.9* 72.9*  PLT 254  --  288 255  --   --  272 231   < > = values in this interval not displayed.   Basic Metabolic Panel: Recent Labs  Lab 02/01/23 2304 02/02/23 0609 02/03/23 0737 02/04/23 0622 02/05/23 0547 02/05/23 0832 02/05/23 1333 02/05/23 1414 02/05/23 1600 02/06/23 0621  NA  --  141 139 137 135  --  132* 137  --  139  K  --  4.4 4.3 4.2 3.9  --  5.7* 3.8  --  3.9  CL  --  108 105 102 103  --   --   --   --  108  CO2  --  23 23 26 22   --   --   --   --  22  GLUCOSE  --  89 94 99 91  --   --   --   --  84  BUN  --  23* 26* 32* 34*  --   --   --   --  27*  CREATININE  --  1.98* 2.24* 2.54* 2.29*  --   --   --  2.20* 1.99*  CALCIUM  --  8.7* 9.0 8.7* 8.5*  --   --   --   --  8.7*  MG 1.9 1.9  --   --   --  2.1  --   --   --   --    GFR: Estimated Creatinine Clearance: 33.9 mL/min (A) (by C-G formula based on SCr of 1.99 mg/dL (H)). Liver Function Tests: Recent Labs  Lab 02/02/23 0609 02/03/23 0737  AST 26 36  ALT 18 25  ALKPHOS 69 71  BILITOT 1.1 1.3*  PROT 5.4* 5.5*  ALBUMIN 2.9* 3.0*   Recent Labs  Lab 02/02/23 0609  LIPASE 37    Coagulation  Profile: Recent Labs  Lab 02/01/23 2304  INR 1.2   BNP (last 3 results) Recent Labs    02/01/23 1253 02/03/23 0737 02/05/23 0828  BNP 879.5* 2,756.5* 1,010.9*    Radiology Studies: CARDIAC CATHETERIZATION Result Date: 02/05/2023 Findings: RA = 3 RV = 36/4 PA = 34/11 (23) PCW = 12 Fick cardiac output/index = 5.5/3.1 TC CO/CI = 4.5/2.5 PVR = 0.8 WU Ao sat = 99% PA sat = 63%, 65% PAPi = 7.7 Assessment: 1. Normal hemodynamics Arvilla Meres, MD 3:07 PM   Scheduled Meds:  [MAR Hold] sodium chloride flush  3 mL Intravenous Q12H   Continuous Infusions:  [MAR Hold] sodium chloride       LOS: 4 days   Time spent: 40 minutes  Carollee Herter, DO  [facility]  02/06/2023, 9:41 AM

## 2023-02-06 NOTE — Plan of Care (Signed)
  Problem: Education: Goal: Knowledge of General Education information will improve Description: Including pain rating scale, medication(s)/side effects and non-pharmacologic comfort measures Outcome: Progressing   Problem: Clinical Measurements: Goal: Diagnostic test results will improve Outcome: Progressing Goal: Cardiovascular complication will be avoided Outcome: Progressing   Problem: Nutrition: Goal: Adequate nutrition will be maintained Outcome: Progressing   Problem: Elimination: Goal: Will not experience complications related to bowel motility Outcome: Progressing

## 2023-02-06 NOTE — Telephone Encounter (Signed)
-----   Message from Carie Caddy Pyrtle sent at 02/06/2023 10:01 AM EST ----- Follow-up in 2-3 months Me or APP (PG preferable, she met him in hospital). I am ordering IV iron  IDA, gastric AVM Thanks JMP

## 2023-02-07 ENCOUNTER — Other Ambulatory Visit (HOSPITAL_COMMUNITY): Payer: Self-pay

## 2023-02-07 ENCOUNTER — Encounter: Payer: Self-pay | Admitting: Internal Medicine

## 2023-02-07 ENCOUNTER — Inpatient Hospital Stay (HOSPITAL_COMMUNITY): Payer: BC Managed Care – PPO

## 2023-02-07 DIAGNOSIS — I5043 Acute on chronic combined systolic (congestive) and diastolic (congestive) heart failure: Secondary | ICD-10-CM | POA: Diagnosis not present

## 2023-02-07 DIAGNOSIS — N1832 Chronic kidney disease, stage 3b: Secondary | ICD-10-CM | POA: Diagnosis not present

## 2023-02-07 DIAGNOSIS — I5042 Chronic combined systolic (congestive) and diastolic (congestive) heart failure: Secondary | ICD-10-CM | POA: Diagnosis not present

## 2023-02-07 DIAGNOSIS — I428 Other cardiomyopathies: Secondary | ICD-10-CM

## 2023-02-07 DIAGNOSIS — I10 Essential (primary) hypertension: Secondary | ICD-10-CM | POA: Diagnosis not present

## 2023-02-07 DIAGNOSIS — K635 Polyp of colon: Secondary | ICD-10-CM

## 2023-02-07 DIAGNOSIS — D649 Anemia, unspecified: Secondary | ICD-10-CM | POA: Diagnosis not present

## 2023-02-07 LAB — CBC
HCT: 31.4 % — ABNORMAL LOW (ref 39.0–52.0)
Hemoglobin: 8.9 g/dL — ABNORMAL LOW (ref 13.0–17.0)
MCH: 20.6 pg — ABNORMAL LOW (ref 26.0–34.0)
MCHC: 28.3 g/dL — ABNORMAL LOW (ref 30.0–36.0)
MCV: 72.9 fL — ABNORMAL LOW (ref 80.0–100.0)
Platelets: 284 10*3/uL (ref 150–400)
RBC: 4.31 MIL/uL (ref 4.22–5.81)
RDW: 22.4 % — ABNORMAL HIGH (ref 11.5–15.5)
WBC: 7.1 10*3/uL (ref 4.0–10.5)
nRBC: 0 % (ref 0.0–0.2)

## 2023-02-07 LAB — BASIC METABOLIC PANEL
Anion gap: 11 (ref 5–15)
BUN: 23 mg/dL — ABNORMAL HIGH (ref 6–20)
CO2: 18 mmol/L — ABNORMAL LOW (ref 22–32)
Calcium: 9 mg/dL (ref 8.9–10.3)
Chloride: 107 mmol/L (ref 98–111)
Creatinine, Ser: 1.94 mg/dL — ABNORMAL HIGH (ref 0.61–1.24)
GFR, Estimated: 39 mL/min — ABNORMAL LOW (ref >60)
Glucose, Bld: 80 mg/dL (ref 70–99)
Potassium: 4.1 mmol/L (ref 3.5–5.1)
Sodium: 136 mmol/L (ref 135–145)

## 2023-02-07 MED ORDER — GADOBUTROL 1 MMOL/ML IV SOLN
9.0000 mL | Freq: Once | INTRAVENOUS | Status: AC | PRN
  Administered 2023-02-07: 9 mL via INTRAVENOUS

## 2023-02-07 MED ORDER — LOSARTAN POTASSIUM 25 MG PO TABS
12.5000 mg | ORAL_TABLET | Freq: Every day | ORAL | Status: DC
  Administered 2023-02-07: 12.5 mg via ORAL
  Filled 2023-02-07: qty 0.5

## 2023-02-07 MED ORDER — POTASSIUM CHLORIDE CRYS ER 20 MEQ PO TBCR
40.0000 meq | EXTENDED_RELEASE_TABLET | ORAL | 1 refills | Status: DC | PRN
  Filled 2023-02-07: qty 30, 30d supply, fill #0

## 2023-02-07 MED ORDER — LOSARTAN POTASSIUM 25 MG PO TABS
12.5000 mg | ORAL_TABLET | Freq: Every day | ORAL | 1 refills | Status: DC
  Filled 2023-02-07: qty 15, 30d supply, fill #0

## 2023-02-07 MED ORDER — DAPAGLIFLOZIN PROPANEDIOL 10 MG PO TABS
10.0000 mg | ORAL_TABLET | Freq: Every day | ORAL | 5 refills | Status: DC
  Filled 2023-02-07: qty 30, 30d supply, fill #0

## 2023-02-07 MED ORDER — FUROSEMIDE 40 MG PO TABS
40.0000 mg | ORAL_TABLET | ORAL | 1 refills | Status: DC | PRN
  Filled 2023-02-07: qty 30, 30d supply, fill #0

## 2023-02-07 NOTE — TOC Transition Note (Signed)
Transition of Care [facility]) - Discharge Note   Patient Details  Name: Norman Jones. MRN: [id number] Date of Birth: 08/09/1965  Transition of [facility]) CM/SW Contact:  Nicanor Bake Phone Number: [phone] 02/07/2023, 2:09 PM   Clinical Narrative:  [facility] CSW met with pt at bedside. CSW provided pt with work note. Pt stated that he will be driving himself home. CSW called and pts Hospital follow up appointment scheduled for Tuesday, March 12, 2023 at 9:30 AM.        Barriers to Discharge: Continued Medical Work up   Patient Goals and CMS Choice Patient states their goals for this hospitalization and ongoing recovery are:: return to back          Discharge Placement                       Discharge Plan and Services Additional resources added to the After Visit Summary for                                       Social Drivers of Health (SDOH) Interventions SDOH Screenings   Food Insecurity: No Food Insecurity (02/01/2023)  Housing: Low Risk  (02/04/2023)  Transportation Needs: No Transportation Needs (02/04/2023)  Utilities: Not At Risk (02/01/2023)  Alcohol Screen: Low Risk  (02/04/2023)  Financial Resource Strain: Low Risk  (02/04/2023)  Tobacco Use: High Risk (02/06/2023)     Readmission Risk Interventions     No data to display

## 2023-02-07 NOTE — Assessment & Plan Note (Signed)
02-06-2023 s/p removal during colonoscopy. Pathology pending.

## 2023-02-07 NOTE — Discharge Summary (Signed)
[facility] Physician Discharge Summary   Patient name: Norman Jones.  Admit date:     02/01/2023  Discharge date: 02/07/2023  Attending Physician: Imogene Burn, Bekim Werntz [[id number]]  Discharge Physician: Carollee Herter   PCP: Patient, No Pcp Per  Admitted From: Home  Disposition:  Home  Recommendations for Outpatient Follow-up:  Follow up with PCP in 1-2 weeks Follow up with cardiology on Feb 25, 2023 Follow up with [facility] on 04-06-2023 Please follow up on the following pending results: cardiac MRI  Home Health:No Equipment/Devices: None    Discharge Condition:Stable CODE STATUS:FULL Diet recommendation: Heart Healthy Fluid Restriction: None  Hospital Summary: HPI:  Norman Jones. is a 57 y.o. male with medical history significant for chronic systolic/diastolic heart failure with most recent LVEF 45 to 50%, stage IIIb CKD associated baseline creatinine range 1.4-1.9, who is admitted to [facility] on 02/01/2023 with symptomatic anemia after presenting from home to [facility] ED complaining of shortness of breath.    The patient reports 4 to 5 days of intermittent shortness of breath, worse with exertion, in the absence of any orthopnea, PND, or worsening of edema in the lower extremities.  Denies any associated subjective fever, chills, rigors, or generalized myalgias.  No recent cough, hemoptysis, new lower extremity erythema or new calf tenderness.  No wheezing.    He also reports 2 episodes of sharp, nonradiating left-sided chest discomfort over the last day.  But these episodes have occurred at rest at did not intensify with exertion.  Both episodes lasted for less than 10 minutes before spontaneously resolving in the absence of any nitroglycerin.  First episode occurred while at rest on the morning of 02/01/2023, with the second such episode occurring while at rest at work.  Denies any associated palpitations, diaphoresis, nausea, vomiting, dizziness, presyncope, or syncope.    He conveys that it has been several years since he has previously seen a physician.  Most recent prior hemoglobin was found to be 12.4 in July 2018.  Not on any blood thinners as an outpatient.  Denies any recent abdominal pain prn or any recent melena or hematochezia.  No recent hematemesis or gross hematuria.  Significant Events: Admitted 02/01/2023 for symptomatic anemia   Significant Labs: Admission HgB 6.3, MCV 69.8, BUN 14, Scr 1.8 BNP 879 Iron 22(low), TIBC 486(high), %sat 5(low), ferritin 9(low) B12 304(nl) Folate 11.4(nl)  Significant Imaging Studies: Admission CTPA negative for PE Echo shows LVEF 20-25%  Antibiotic Therapy: Anti-infectives (From admission, onward)    None       Procedures: 02-05-2023 RHC shows normal hemodynamics. Cardiac output 5.4 L/min, wedge 12 02-06-2023 EGD/colonoscopy  Consultants: Cardiology GI   Hospital Course by Problem: * Symptomatic anemia 02-02-2023 transfused with 1 unit PRBC last night. Labs reveal iron deficiency. He will need outpatient GI evaluation at least colonoscopy. Check UA to ensure no hematuria. Repeat HgB 7.1 this AM. Will give 1 more unit or PRBC today. Give 40 mg IV lasix prior to transfusion.  02-03-2023 HgB 9.4 g/dl after 2 unit PRBC transfusion. Outpatient GI evaluation.  02-04-2023 will refer to outpatient GI for evaluation.  02-05-2023 cards wants inpatient GI evaluation for anemia instead of outpatient evaluation that I had planned. GI has been consulted.  02-06-2023 for EGD/colonoscopy today.   02-07-2023 has small gastric angioectasia cauterized, multiple colonic polyps removed. No source of GI bleeding.  Chronic combined systolic and diastolic heart failure (HCC) 02-02-2023 prior echo by Dr. 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  Awaiting repeat echo. Start coreg due to elevated HR and BP.  Don't this he is in acute CHF. No peripheral edema. No rales. RA sats 100%.  Hold on entresto for  now.  02-03-2023 awaiting echo. Increase coreg to 6.25 mg bid.  02-04-2023 echo shows LVEF 20-25%. Similar to echo in 2018. In 2019,(while on GDMT) his LVEF had improved to 45-50%. Pt stopped taking CHF meds due to cost in 2019-2020 and lost to f/u.  Will need CHF consult today. Pt is euvolemic. IV lasix yesterday did not produce much of a diuresis. Scr is up to 2.54 now.  Doubt his renal function will tolerate Entresto at this time.  He may need close f/u with CHF clinic for med titration. HR improved with coreg 6.25 mg bid  02-05-2023 going to cardiac cath today.  02-06-2023 RHC yesterday showed normal hemodynamics. PCW was 12. BNP yesterday was 1010. This is probably his baseline. Dry weight around 132-134 lbs. Awaiting CHF/cards team to decide on discharge GDMT meds.  02-07-2023 cardiac MRI today. Results pending. I have reached out of CHF team to order his DC meds.  Cardiology starting farxiga and losartan. They elected not to start coreg due to chf exacerbation. They will f/u with patient in clinic next month for GDMT.  Iron deficiency anemia 02-02-2023 labs shows iron deficiency. He will need temporary po iron therapy at discharge and outpatient referral to GI for at least colonoscopy. Hemoccult negative at time of admission.  Iron/TIBC/Ferritin/ %Sat    Component Value Date/Time   IRON 22 (L) 02/01/2023 2304   TIBC 486 (H) 02/01/2023 2304   FERRITIN 9 (L) 02/01/2023 2304   IRONPCTSAT 5 (L) 02/01/2023 2304   02-05-2023 cards wants inpatient GI evaluation for anemia. GI has been consulted. 02-06-2023 for EGD/colon today.  02-07-2023 EGD shows 1 small gastric angioectasia. Cauterized. Colonic polyps removed.  GI will set him up for IV iron.  Elevated troponin 02-02-2023 admission EKG shows LVH. See "atypical CP". Awaiting echo  02-03-2023 awaiting echo  02-04-2023 echo shows LVEF 20-25%. Cards consult pending.  02-05-2023 going for cath today.  Atypical chest  pain 02-02-2023 troponin elevated at admission but flat. Awaiting echo. Admission EKG shows LVH.    CKD stage 3b, GFR 30-44 ml/min (HCC) 02-02-2023 last Scr was 04-2017 of 1.42. monitor Scr. Will need referral to outpatient nephrology. Check spot protein/creatinine ratio. UA today shows no protein in his urine.  02-03-2023 awaiting this AM CMP. Yesterday urine protein/urine Scr ratio was normal. No proteinuria. Spot Urine Protein: Urine Creatinine Ratio Lab Results  Component Value Date/Time   LABCREA 183 02/02/2023 08:15 AM   TOTPROTUR 21 02/02/2023 08:15 AM   PROTCRRATIO 0.11 02/02/2023 08:15 AM   02-05-2023 Scr 2.29. stable.  02-06-2023 Scr down to 1.99, BUN 27  Essential hypertension 02-02-2023 start coreg 3.125 mg bid.  02-03-2023 increase coreg to 6.25 mg bid.  02-04-2023 BP and HR stable on coreg 6.25 mg bid.  02-05-2023 stable. On coreg 6.25 mg bid. 02-06-2023 stable. 02-07-2023 CHF team stopped his Coreg on 02-05-2023. CHF team will manage his DC HTN meds.  Colon polyps 02-06-2023 s/p removal during colonoscopy. Pathology pending.    Discharge Diagnoses:  Principal Problem:   Symptomatic anemia Active Problems:   Chronic combined systolic and diastolic heart failure (HCC)   Essential hypertension   CKD stage 3b, GFR 30-44 ml/min (HCC)   Atypical chest pain   Elevated troponin   Iron deficiency anemia   Gastric and duodenal angiodysplasia with  hemorrhage   Benign neoplasm of cecum   Benign neoplasm of transverse colon   Benign neoplasm of descending colon   Colon polyps   Discharge Instructions  Discharge Instructions     (HEART FAILURE PATIENTS) Call MD:  Anytime you have any of the following symptoms: 1) 3 pound weight gain in 24 hours or 5 pounds in 1 week 2) shortness of breath, with or without a dry hacking cough 3) swelling in the hands, feet or stomach 4) if you have to sleep on extra pillows at night in order to breathe.   Complete by: As  directed    Call MD for:  extreme fatigue   Complete by: As directed    Call MD for:  persistant dizziness or light-headedness   Complete by: As directed    Call MD for:  severe uncontrolled pain   Complete by: As directed    Call MD for:  temperature >100.4   Complete by: As directed    Diet - low sodium heart healthy   Complete by: As directed    Discharge instructions   Complete by: As directed    1. Follow up with your primary care provider in 1-2 weeks following discharge from hospital. 2. CHF clinic will contact you with follow up appointment.   Discharge patient   Complete by: As directed    Discharge disposition: 01-Home or Self Care   Discharge patient date: 02/07/2023   Increase activity slowly   Complete by: As directed    Limiting lifting to <30 lbs until seen by cardiology clinic      Allergies as of 02/07/2023       Reactions   Penicillins Other (See Comments)   Pt states he was told he was allergic by his mother, unknown reaction.        Medication List     STOP taking these medications    aspirin 325 MG tablet       TAKE these medications    Farxiga 10 MG Tabs tablet Generic drug: dapagliflozin propanediol Take 1 tablet (10 mg total) by mouth daily. Start taking on: February 08, 2023   furosemide 40 MG tablet Commonly known as: Lasix Take 1 tablet (40 mg total) by mouth as needed. For weight gain more than 3 lb in a day or 5 lb in a week, shortness of breath or edema   losartan 25 MG tablet Commonly known as: COZAAR Take 0.5 tablets (12.5 mg total) by mouth daily.   potassium chloride SA 20 MEQ tablet Commonly known as: KLOR-CON M Take 2 tablets (40 mEq total) by mouth as needed. When taking lasix        Follow-up Information     [facility] Follow up on 02/25/2023.   Specialty: Cardiology Why: Advanced [facility] Failure Clinic 1:40 pm Entrance C, Free Valet Parking Contact  information: [address] [place] [address] [phone]               Allergies  Allergen Reactions   Penicillins Other (See Comments)    Pt states he was told he was allergic by his mother, unknown reaction.    Discharge Exam: Vitals:   02/07/23 0442 02/07/23 0753  BP: 116/80 118/81  Pulse: 87 78  Resp: 18   Temp: 98 F (36.7 C)   SpO2: 100%    Weight Information (since admission)     Date/Time Weight Weight in lbs BSA (Calculated -  sq m) BMI (Calculated) Who   02/07/23 0437 60.3 kg 133 lbs -- 19.63 DJ   02/06/23 0500 60 kg 132.2 lbs -- 19.51 CO   02/05/23 0500 64.3 kg 141.76 lbs -- 20.92 KS   02/04/23 0336 61 kg 134.48 lbs -- 19.85 JC   02/03/23 03:36:08 61.9 kg 136.47 lbs -- 20.14 JC   02/02/23 0300 63.4 kg 139.77 lbs -- 20.63 GT   02/01/23 1109 68 kg 150 lbs 1.82 sq meters 22.14 Preston       Physical Exam Vitals and nursing note reviewed.  Constitutional:      General: He is not in acute distress.    Appearance: He is not toxic-appearing or diaphoretic.  HENT:     Head: Normocephalic and atraumatic.     Nose: Nose normal.  Cardiovascular:     Rate and Rhythm: Normal rate and regular rhythm.  Pulmonary:     Effort: Pulmonary effort is normal.     Breath sounds: Normal breath sounds.  Abdominal:     General: Abdomen is flat.  Musculoskeletal:     Right lower leg: No edema.     Left lower leg: No edema.  Skin:    General: Skin is warm and dry.     Capillary Refill: Capillary refill takes less than 2 seconds.  Neurological:     Mental Status: He is alert and oriented to person, place, and time.     The results of significant diagnostics from this hospitalization (including imaging, microbiology, ancillary and laboratory) are listed below for reference.    Labs: BNP (last 3 results) Recent Labs    02/01/23 1253 02/03/23 0737 02/05/23 0828  BNP 879.5* 2,756.5* 1,010.9*   Basic Metabolic Panel: Recent Labs  Lab  02/01/23 2304 02/02/23 0609 02/03/23 0737 02/04/23 0622 02/05/23 0547 02/05/23 0832 02/05/23 1333 02/05/23 1414 02/05/23 1415 02/05/23 1600 02/06/23 0621 02/07/23 1125  NA  --  141 139 137 135  --  132* 137 144  --  139 136  K  --  4.4 4.3 4.2 3.9  --  5.7* 3.8 3.1*  --  3.9 4.1  CL  --  108 105 102 103  --   --   --   --   --  108 107  CO2  --  23 23 26 22   --   --   --   --   --  22 18*  GLUCOSE  --  89 94 99 91  --   --   --   --   --  84 80  BUN  --  23* 26* 32* 34*  --   --   --   --   --  27* 23*  CREATININE  --  1.98* 2.24* 2.54* 2.29*  --   --   --   --  2.20* 1.99* 1.94*  CALCIUM  --  8.7* 9.0 8.7* 8.5*  --   --   --   --   --  8.7* 9.0  MG 1.9 1.9  --   --   --  2.1  --   --   --   --   --   --    Liver Function Tests: Recent Labs  Lab 02/02/23 0609 02/03/23 0737  AST 26 36  ALT 18 25  ALKPHOS 69 71  BILITOT 1.1 1.3*  PROT 5.4* 5.5*  ALBUMIN 2.9* 3.0*   Recent Labs  Lab 02/02/23 0609  LIPASE 37  CBC: Recent Labs  Lab 02/02/23 0609 02/02/23 0844 02/03/23 0737 02/05/23 0832 02/05/23 1333 02/05/23 1414 02/05/23 1415 02/05/23 1600 02/06/23 0621 02/07/23 1125  WBC 9.1  --  10.0 9.7  --   --   --  8.2 6.3 7.1  NEUTROABS 6.9  --   --   --   --   --   --   --   --   --   HGB 7.1*   < > 9.4* 9.0*   < > 9.5* 7.5* 9.2* 8.8* 8.9*  HCT 24.1*   < > 32.1* 31.3*   < > 28.0* 22.0* 31.4* 30.6* 31.4*  MCV 69.3*  --  70.7* 72.3*  --   --   --  71.9* 72.9* 72.9*  PLT 254  --  288 255  --   --   --  272 231 284   < > = values in this interval not displayed.    BNP: Recent Labs  Lab 02/01/23 1253 02/03/23 0737 02/05/23 0828  BNP 879.5* 2,756.5* 1,010.9*   Urinalysis    Component Value Date/Time   COLORURINE YELLOW 02/02/2023 0815   APPEARANCEUR CLEAR 02/02/2023 0815   LABSPEC 1.036 (H) 02/02/2023 0815   PHURINE 6.0 02/02/2023 0815   GLUCOSEU NEGATIVE 02/02/2023 0815   HGBUR NEGATIVE 02/02/2023 0815   BILIRUBINUR NEGATIVE 02/02/2023 0815    BILIRUBINUR small 05/29/2012 1944   KETONESUR NEGATIVE 02/02/2023 0815   PROTEINUR NEGATIVE 02/02/2023 0815   UROBILINOGEN 2.0 05/29/2012 1944   NITRITE NEGATIVE 02/02/2023 0815   LEUKOCYTESUR NEGATIVE 02/02/2023 0815   Sepsis Labs Recent Labs  Lab 02/05/23 0832 02/05/23 1600 02/06/23 0621 02/07/23 1125  WBC 9.7 8.2 6.3 7.1    Procedures/Studies: CARDIAC CATHETERIZATION Result Date: 02/05/2023 Findings: RA = 3 RV = 36/4 PA = 34/11 (23) PCW = 12 Fick cardiac output/index = 5.5/3.1 TC CO/CI = 4.5/2.5 PVR = 0.8 WU Ao sat = 99% PA sat = 63%, 65% PAPi = 7.7 Assessment: 1. Normal hemodynamics Arvilla Meres, MD 3:07 PM  ECHOCARDIOGRAM COMPLETE Result Date: 02/03/2023    ECHOCARDIOGRAM REPORT   Patient Name:   Norman Jones. Date of Exam: 02/03/2023 Medical Rec #:  [id number]        Height:       69.0 in Accession #:    [id number]       Weight:       136.5 lb Date of Birth:  November 28, 1965        BSA:          1.756 m Patient Age:    57 years         BP:           130/95 mmHg Patient Gender: M                HR:           100 bpm. Exam Location:  Inpatient Procedure: 2D Echo, Color Doppler and Cardiac Doppler Indications:    elevated troponin  History:        Patient has prior history of Echocardiogram examinations, most                 recent 03/25/2017. Cardiomyopathy, chronic kidney disease,                 Signs/Symptoms:Shortness of Breath; Risk Factors:Current Smoker                 and Hypertension.  Sonographer:  Delcie Roch RDCS Referring Phys: [id number] JUSTIN B HOWERTER IMPRESSIONS  1. Severe MR (possible etiology malcoaptation from dilated LV).  2. Left ventricular ejection fraction, by estimation, is 20 to 25%. The left ventricle has severely decreased function. The left ventricle demonstrates global hypokinesis. The left ventricular internal cavity size was severely dilated. There is mild left ventricular hypertrophy. Left ventricular diastolic parameters are consistent with Grade  III diastolic dysfunction (restrictive).  3. Right ventricular systolic function is normal. The right ventricular size is normal. There is severely elevated pulmonary artery systolic pressure.  4. Left atrial size was severely dilated.  5. Right atrial size was moderately dilated.  6. PISA 0.7 cm. The mitral valve is normal in structure. Severe mitral valve regurgitation. No evidence of mitral stenosis.  7. Tricuspid valve regurgitation is moderate.  8. The aortic valve is normal in structure. Aortic valve regurgitation is mild. No aortic stenosis is present.  9. The inferior vena cava is dilated in size with <50% respiratory variability, suggesting right atrial pressure of 15 mmHg. Comparison(s): No significant change from prior study. Prior images reviewed side by side. FINDINGS  Left Ventricle: Left ventricular ejection fraction, by estimation, is 20 to 25%. The left ventricle has severely decreased function. The left ventricle demonstrates global hypokinesis. The left ventricular internal cavity size was severely dilated. There is mild left ventricular hypertrophy. Left ventricular diastolic parameters are consistent with Grade III diastolic dysfunction (restrictive). Right Ventricle: The right ventricular size is normal. No increase in right ventricular wall thickness. Right ventricular systolic function is normal. There is severely elevated pulmonary artery systolic pressure. The tricuspid regurgitant velocity is 3.60 m/s, and with an assumed right atrial pressure of 10 mmHg, the estimated right ventricular systolic pressure is 61.8 mmHg. Left Atrium: Left atrial size was severely dilated. Right Atrium: Right atrial size was moderately dilated. Pericardium: There is no evidence of pericardial effusion. Mitral Valve: PISA 0.7 cm. The mitral valve is normal in structure. Severe mitral valve regurgitation. No evidence of mitral valve stenosis. Tricuspid Valve: The tricuspid valve is normal in structure. Tricuspid  valve regurgitation is moderate . No evidence of tricuspid stenosis. Aortic Valve: The aortic valve is normal in structure. Aortic valve regurgitation is mild. No aortic stenosis is present. Pulmonic Valve: The pulmonic valve was normal in structure. Pulmonic valve regurgitation is mild. No evidence of pulmonic stenosis. Aorta: The aortic root is normal in size and structure. Venous: The inferior vena cava is dilated in size with less than 50% respiratory variability, suggesting right atrial pressure of 15 mmHg. IAS/Shunts: No atrial level shunt detected by color flow Doppler. Additional Comments: Severe MR (possible etiology malcoaptation from dilated LV).  LEFT VENTRICLE PLAX 2D LVIDd:         7.60 cm      Diastology LVIDs:         6.70 cm      LV e' medial:    6.64 cm/s LV PW:         1.20 cm      LV E/e' medial:  22.7 LV IVS:        1.10 cm      LV e' lateral:   9.25 cm/s LVOT diam:     2.10 cm      LV E/e' lateral: 16.3 LV SV:         33 LV SV Index:   19 LVOT Area:     3.46 cm  LV Volumes (MOD) LV vol d, MOD A2C:  226.0 ml LV vol s, MOD A2C: 157.0 ml LV SV MOD A2C:     69.0 ml RIGHT VENTRICLE             IVC RV Basal diam:  3.40 cm     IVC diam: 2.50 cm RV S prime:     10.20 cm/s TAPSE (M-mode): 1.8 cm LEFT ATRIUM           Index        RIGHT ATRIUM           Index LA diam:      5.00 cm 2.85 cm/m   RA Area:     21.30 cm LA Vol (A4C): 91.5 ml 52.10 ml/m  RA Volume:   71.00 ml  40.43 ml/m  AORTIC VALVE LVOT Vmax:   76.40 cm/s LVOT Vmean:  46.600 cm/s LVOT VTI:    0.094 m  AORTA Ao Root diam: 3.70 cm Ao Asc diam:  3.60 cm MITRAL VALVE                  TRICUSPID VALVE MV Area (PHT): 6.96 cm       TR Peak grad:   51.8 mmHg MV Decel Time: 109 msec       TR Vmax:        360.00 cm/s MR Peak grad:    104.9 mmHg MR Mean grad:    65.0 mmHg    SHUNTS MR Vmax:         512.00 cm/s  Systemic VTI:  0.09 m MR Vmean:        371.0 cm/s   Systemic Diam: 2.10 cm MR PISA:         3.08 cm MR PISA Eff ROA: 23 mm MR PISA  Radius:  0.70 cm MV E velocity: 151.00 cm/s MV A velocity: 77.10 cm/s MV E/A ratio:  1.96 Donato Schultz MD Electronically signed by Donato Schultz MD Signature Date/Time: 02/03/2023/11:56:49 AM    Final    CT Angio Chest PE W/Cm &/Or Wo Cm Result Date: 02/01/2023 CLINICAL DATA:  Chest pain and shortness of breath. Elevated D-dimer. EXAM: CT ANGIOGRAPHY CHEST WITH CONTRAST TECHNIQUE: Multidetector CT imaging of the chest was performed using the standard protocol during bolus administration of intravenous contrast. Multiplanar CT image reconstructions and MIPs were obtained to evaluate the vascular anatomy. RADIATION DOSE REDUCTION: This exam was performed according to the departmental dose-optimization program which includes automated exposure control, adjustment of the mA and/or kV according to patient size and/or use of iterative reconstruction technique. CONTRAST:  60mL OMNIPAQUE IOHEXOL 350 MG/ML SOLN COMPARISON:  None Available. FINDINGS: Cardiovascular: Satisfactory opacification of pulmonary arteries noted, and no pulmonary emboli identified. No evidence of thoracic aortic aneurysm. Aortic and coronary atherosclerotic calcification incidentally noted. Mild cardiomegaly. Mediastinum/Nodes: No masses or pathologically enlarged lymph nodes identified. Lungs/Pleura: Mild-to-moderate centrilobular emphysema. Mild right upper lobe scarring. No pulmonary mass or infiltrate. Tiny loculated pleural fluid collection noted in the right minor fissure. Upper abdomen: No acute findings. Musculoskeletal: No suspicious bone lesions identified. Review of the MIP images confirms the above findings. IMPRESSION: No evidence of pulmonary embolism. Mild cardiomegaly. Tiny loculated pleural fluid collection in right minor fissure. Aortic Atherosclerosis (ICD10-I70.0) and Emphysema (ICD10-J43.9). Electronically Signed   By: Danae Orleans M.D.   On: 02/01/2023 18:43   DG Chest 2 View Result Date: 02/01/2023 CLINICAL DATA:  Chest  pain.  Shortness of breath. EXAM: CHEST - 2 VIEW COMPARISON:  01/28/2023. FINDINGS: Bilateral lung fields are clear.  Bilateral costophrenic angles are clear. Stable cardio-mediastinal silhouette. No acute osseous abnormalities. The soft tissues are within normal limits. IMPRESSION: No active cardiopulmonary disease. Electronically Signed   By: Jules Schick M.D.   On: 02/01/2023 12:04   DG Chest 2 View Result Date: 01/28/2023 CLINICAL DATA:  Hypertension, chest pain EXAM: CHEST - 2 VIEW COMPARISON:  08/20/2016 FINDINGS: Frontal and lateral views of the chest demonstrate stable borderline enlargement of the cardiac silhouette. Diffuse interstitial prominence could reflect interstitial edema or background scarring. No airspace disease, effusion, or pneumothorax. No acute bony abnormalities. IMPRESSION: 1. Enlarged cardiac silhouette, with diffuse interstitial prominence favoring congestive heart failure and interstitial edema. Electronically Signed   By: Sharlet Salina M.D.   On: 01/28/2023 15:30    Time coordinating discharge: 45 mins  SIGNED:  Carollee Herter, DO [facility] 02/07/23, 1:57 PM

## 2023-02-07 NOTE — Progress Notes (Signed)
PROGRESS NOTE    Norman Jones.  UUV:[id number] DOB: 06-Nov-1965 DOA: 02/01/2023 PCP: Patient, No Pcp Per  Subjective: Pt seen and examined. Pt had cardiac MRI today. Pt states he feels the best he has in a long time. Likes that he is able to drink coffee today.   Hospital Course: HPI:  Norman Jones. is a 57 y.o. male with medical history significant for chronic systolic/diastolic heart failure with most recent LVEF 45 to 50%, stage IIIb CKD associated baseline creatinine range 1.4-1.9, who is admitted to [facility] on 02/01/2023 with symptomatic anemia after presenting from home to [facility] ED complaining of shortness of breath.    The patient reports 4 to 5 days of intermittent shortness of breath, worse with exertion, in the absence of any orthopnea, PND, or worsening of edema in the lower extremities.  Denies any associated subjective fever, chills, rigors, or generalized myalgias.  No recent cough, hemoptysis, new lower extremity erythema or new calf tenderness.  No wheezing.    He also reports 2 episodes of sharp, nonradiating left-sided chest discomfort over the last day.  But these episodes have occurred at rest at did not intensify with exertion.  Both episodes lasted for less than 10 minutes before spontaneously resolving in the absence of any nitroglycerin.  First episode occurred while at rest on the morning of 02/01/2023, with the second such episode occurring while at rest at work.  Denies any associated palpitations, diaphoresis, nausea, vomiting, dizziness, presyncope, or syncope.   He conveys that it has been several years since he has previously seen a physician.  Most recent prior hemoglobin was found to be 12.4 in July 2018.  Not on any blood thinners as an outpatient.  Denies any recent abdominal pain prn or any recent melena or hematochezia.  No recent hematemesis or gross hematuria.  Significant Events: Admitted 02/01/2023 for symptomatic anemia   Significant  Labs: Admission HgB 6.3, MCV 69.8, BUN 14, Scr 1.8 BNP 879 Iron 22(low), TIBC 486(high), %sat 5(low), ferritin 9(low) B12 304(nl) Folate 11.4(nl)  Significant Imaging Studies: Admission CTPA negative for PE Echo shows LVEF 20-25%  Antibiotic Therapy: Anti-infectives (From admission, onward)    None       Procedures: 02-05-2023 RHC shows normal hemodynamics. Cardiac output 5.4 L/min, wedge 12 02-06-2023 EGD/colonoscopy  Consultants: Cardiology GI    Assessment and Plan: * Symptomatic anemia 02-02-2023 transfused with 1 unit PRBC last night. Labs reveal iron deficiency. He will need outpatient GI evaluation at least colonoscopy. Check UA to ensure no hematuria. Repeat HgB 7.1 this AM. Will give 1 more unit or PRBC today. Give 40 mg IV lasix prior to transfusion.  02-03-2023 HgB 9.4 g/dl after 2 unit PRBC transfusion. Outpatient GI evaluation.  02-04-2023 will refer to outpatient GI for evaluation.  02-05-2023 cards wants inpatient GI evaluation for anemia instead of outpatient evaluation that I had planned. GI has been consulted.  02-06-2023 for EGD/colonoscopy today.   02-07-2023 has small gastric angioectasia cauterized, multiple colonic polyps removed. No source of GI bleeding.  Chronic combined systolic and diastolic heart failure (HCC) 02-02-2023 prior echo by Dr. 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 Awaiting repeat echo. Start coreg due to elevated HR and BP.  Don't this he is in acute CHF. No peripheral edema. No rales. RA sats 100%.  Hold on entresto for now.  02-03-2023 awaiting echo. Increase coreg to 6.25 mg bid.  02-04-2023 echo shows LVEF 20-25%. Similar to echo in 2018. In  2019,(while on GDMT) his LVEF had improved to 45-50%. Pt stopped taking CHF meds due to cost in 2019-2020 and lost to f/u.  Will need CHF consult today. Pt is euvolemic. IV lasix yesterday did not produce much of a diuresis. Scr is up to 2.54 now.  Doubt his renal function will tolerate  Entresto at this time.  He may need close f/u with CHF clinic for med titration. HR improved with coreg 6.25 mg bid  02-05-2023 going to cardiac cath today.  02-06-2023 RHC yesterday showed normal hemodynamics. PCW was 12. BNP yesterday was 1010. This is probably his baseline. Dry weight around 132-134 lbs. Awaiting CHF/cards team to decide on discharge GDMT meds.  02-07-2023 cardiac MRI today. Results pending. I have reached out of CHF team to order his DC meds.  Iron deficiency anemia 02-02-2023 labs shows iron deficiency. He will need temporary po iron therapy at discharge and outpatient referral to GI for at least colonoscopy. Hemoccult negative at time of admission.  Iron/TIBC/Ferritin/ %Sat    Component Value Date/Time   IRON 22 (L) 02/01/2023 2304   TIBC 486 (H) 02/01/2023 2304   FERRITIN 9 (L) 02/01/2023 2304   IRONPCTSAT 5 (L) 02/01/2023 2304   02-05-2023 cards wants inpatient GI evaluation for anemia. GI has been consulted. 02-06-2023 for EGD/colon today.  02-07-2023 EGD shows 1 small gastric angioectasia. Cauterized. Colonic polyps removed.  GI will set him up for IV iron.  Elevated troponin 02-02-2023 admission EKG shows LVH. See "atypical CP". Awaiting echo  02-03-2023 awaiting echo  02-04-2023 echo shows LVEF 20-25%. Cards consult pending.  02-05-2023 going for cath today.  Atypical chest pain 02-02-2023 troponin elevated at admission but flat. Awaiting echo. Admission EKG shows LVH.    CKD stage 3b, GFR 30-44 ml/min (HCC) 02-02-2023 last Scr was 04-2017 of 1.42. monitor Scr. Will need referral to outpatient nephrology. Check spot protein/creatinine ratio. UA today shows no protein in his urine.  02-03-2023 awaiting this AM CMP. Yesterday urine protein/urine Scr ratio was normal. No proteinuria. Spot Urine Protein: Urine Creatinine Ratio Lab Results  Component Value Date/Time   LABCREA 183 02/02/2023 08:15 AM   TOTPROTUR 21 02/02/2023 08:15 AM   PROTCRRATIO  0.11 02/02/2023 08:15 AM   02-05-2023 Scr 2.29. stable.  02-06-2023 Scr down to 1.99, BUN 27  Essential hypertension 02-02-2023 start coreg 3.125 mg bid.  02-03-2023 increase coreg to 6.25 mg bid.  02-04-2023 BP and HR stable on coreg 6.25 mg bid.  02-05-2023 stable. On coreg 6.25 mg bid. 02-06-2023 stable. 02-07-2023 CHF team stopped his Coreg on 02-05-2023. CHF team will manage his DC HTN meds.  Colon polyps 02-06-2023 s/p removal during colonoscopy. Pathology pending.   DVT prophylaxis: SCDs Start: 02/01/23 1933     Code Status: Full Code Family Communication: no family at bedside Disposition Plan: return home Reason for continuing need for hospitalization: awaiting cardiac mri results.  Objective: Vitals:   02/06/23 1956 02/07/23 0437 02/07/23 0442 02/07/23 0753  BP: 110/68  116/80 118/81  Pulse: 74  87 78  Resp: 18  18   Temp: 98.6 F (37 C)  98 F (36.7 C)   TempSrc: Oral  Oral   SpO2: 100%  100%   Weight:  60.3 kg    Height:       No intake or output data in the 24 hours ending 02/07/23 1043 Filed Weights   02/05/23 0500 02/06/23 0500 02/07/23 0437  Weight: 64.3 kg 60 kg 60.3 kg    Examination:  Physical Exam Vitals and nursing note reviewed.  Constitutional:      General: He is not in acute distress.    Appearance: He is not toxic-appearing or diaphoretic.  HENT:     Head: Normocephalic and atraumatic.     Nose: Nose normal.  Cardiovascular:     Rate and Rhythm: Normal rate and regular rhythm.  Pulmonary:     Effort: Pulmonary effort is normal.     Breath sounds: Normal breath sounds.  Abdominal:     General: Abdomen is flat.  Musculoskeletal:     Right lower leg: No edema.     Left lower leg: No edema.  Skin:    General: Skin is warm and dry.     Capillary Refill: Capillary refill takes less than 2 seconds.  Neurological:     Mental Status: He is alert and oriented to person, place, and time.     Data Reviewed: I have personally  reviewed following labs and imaging studies  CBC: Recent Labs  Lab 02/02/23 0609 02/02/23 0844 02/03/23 0737 02/05/23 0832 02/05/23 1333 02/05/23 1414 02/05/23 1415 02/05/23 1600 02/06/23 0621  WBC 9.1  --  10.0 9.7  --   --   --  8.2 6.3  NEUTROABS 6.9  --   --   --   --   --   --   --   --   HGB 7.1*   < > 9.4* 9.0* 7.5* 9.5* 7.5* 9.2* 8.8*  HCT 24.1*   < > 32.1* 31.3* 22.0* 28.0* 22.0* 31.4* 30.6*  MCV 69.3*  --  70.7* 72.3*  --   --   --  71.9* 72.9*  PLT 254  --  288 255  --   --   --  272 231   < > = values in this interval not displayed.   Basic Metabolic Panel: Recent Labs  Lab 02/01/23 2304 02/02/23 0609 02/03/23 0737 02/04/23 0622 02/05/23 0547 02/05/23 0832 02/05/23 1333 02/05/23 1414 02/05/23 1415 02/05/23 1600 02/06/23 0621  NA  --  141 139 137 135  --  132* 137 144  --  139  K  --  4.4 4.3 4.2 3.9  --  5.7* 3.8 3.1*  --  3.9  CL  --  108 105 102 103  --   --   --   --   --  108  CO2  --  23 23 26 22   --   --   --   --   --  22  GLUCOSE  --  89 94 99 91  --   --   --   --   --  84  BUN  --  23* 26* 32* 34*  --   --   --   --   --  27*  CREATININE  --  1.98* 2.24* 2.54* 2.29*  --   --   --   --  2.20* 1.99*  CALCIUM  --  8.7* 9.0 8.7* 8.5*  --   --   --   --   --  8.7*  MG 1.9 1.9  --   --   --  2.1  --   --   --   --   --    GFR: Estimated Creatinine Clearance: 34.1 mL/min (A) (by C-G formula based on SCr of 1.99 mg/dL (H)). Liver Function Tests: Recent Labs  Lab 02/02/23 0609 02/03/23 0737  AST 26 36  ALT 18  25  ALKPHOS 69 71  BILITOT 1.1 1.3*  PROT 5.4* 5.5*  ALBUMIN 2.9* 3.0*   Recent Labs  Lab 02/02/23 0609  LIPASE 37    Coagulation Profile: Recent Labs  Lab 02/01/23 2304  INR 1.2   BNP (last 3 results) Recent Labs    02/01/23 1253 02/03/23 0737 02/05/23 0828  BNP 879.5* 2,756.5* 1,010.9*    Radiology Studies: CARDIAC CATHETERIZATION Result Date: 02/05/2023 Findings: RA = 3 RV = 36/4 PA = 34/11 (23) PCW = 12 Fick  cardiac output/index = 5.5/3.1 TC CO/CI = 4.5/2.5 PVR = 0.8 WU Ao sat = 99% PA sat = 63%, 65% PAPi = 7.7 Assessment: 1. Normal hemodynamics Arvilla Meres, MD 3:07 PM   Scheduled Meds:  dapagliflozin propanediol  10 mg Oral Daily   pantoprazole  40 mg Oral QAC breakfast   Continuous Infusions:   LOS: 5 days   Time spent: 40 minutes  Carollee Herter, DO  [facility]  02/07/2023, 10:43 AM

## 2023-02-07 NOTE — Progress Notes (Signed)
DISCHARGE NOTE HOME Norman Jones. to be discharged Home per MD order. Discussed prescriptions and follow up appointments with the patient. Prescriptions given to patient; medication list explained in detail. Patient verbalized understanding.  Skin clean, dry and intact without evidence of skin break down, no evidence of skin tears noted. IV catheter discontinued intact. Site without signs and symptoms of complications. Dressing and pressure applied. Pt denies pain at the site currently. No complaints noted.  Patient free of lines, drains, and wounds.   An After Visit Summary (AVS) was printed and given to the patient. Patient escorted via wheelchair, and discharged home via private auto.  Velia Meyer, RN

## 2023-02-07 NOTE — Progress Notes (Addendum)
Advanced Heart Failure Rounding Note  Cardiologist: Rollene Rotunda, MD   Subjective:     Completed cMRI this am, results pending.  AM labs not completed.  Feeling well. No dyspnea or orthopnea. Has been walking around the room.   Objective:   Weight Range: 60.3 kg Body mass index is 19.64 kg/m.   Vital Signs:   Temp:  [98 F (36.7 C)-98.6 F (37 C)] 98 F (36.7 C) (12/19 0442) Pulse Rate:  [74-87] 78 (12/19 0753) Resp:  [18] 18 (12/19 0442) BP: (110-120)/(68-82) 118/81 (12/19 0753) SpO2:  [100 %] 100 % (12/19 0442) Weight:  [60.3 kg] 60.3 kg (12/19 0437) Last BM Date : 02/06/23  Weight change: Filed Weights   02/05/23 0500 02/06/23 0500 02/07/23 0437  Weight: 64.3 kg 60 kg 60.3 kg    Intake/Output:  No intake or output data in the 24 hours ending 02/07/23 1056     Physical Exam  General:  Well appearing. Sitting up in chair. HEENT: normal Neck: supple. Jvp 7-8 with prominent v waves  Cor: Regular rate & rhythm. No rubs, gallops, 2/6 MR murmur Lungs: clear Abdomen: soft, nontender, nondistended. No hepatosplenomegaly. No bruits or masses. Good bowel sounds. Extremities: no cyanosis, clubbing, rash, edema Neuro: alert & orientedx3, cranial nerves grossly intact. moves all 4 extremities w/o difficulty. Affect pleasant    Telemetry  SR 70s-80s   Labs    CBC Recent Labs    02/05/23 1600 02/06/23 0621  WBC 8.2 6.3  HGB 9.2* 8.8*  HCT 31.4* 30.6*  MCV 71.9* 72.9*  PLT 272 231   Basic Metabolic Panel Recent Labs    40/98/11 0547 02/05/23 0832 02/05/23 1333 02/05/23 1415 02/05/23 1600 02/06/23 0621  NA 135  --    < > 144  --  139  K 3.9  --    < > 3.1*  --  3.9  CL 103  --   --   --   --  108  CO2 22  --   --   --   --  22  GLUCOSE 91  --   --   --   --  84  BUN 34*  --   --   --   --  27*  CREATININE 2.29*  --   --   --  2.20* 1.99*  CALCIUM 8.5*  --   --   --   --  8.7*  MG  --  2.1  --   --   --   --    < > = values in this  interval not displayed.   Liver Function Tests No results for input(s): "AST", "ALT", "ALKPHOS", "BILITOT", "PROT", "ALBUMIN" in the last 72 hours. No results for input(s): "LIPASE", "AMYLASE" in the last 72 hours. Cardiac Enzymes No results for input(s): "CKTOTAL", "CKMB", "CKMBINDEX", "TROPONINI" in the last 72 hours.  BNP: BNP (last 3 results) Recent Labs    02/01/23 1253 02/03/23 0737 02/05/23 0828  BNP 879.5* 2,756.5* 1,010.9*    ProBNP (last 3 results) No results for input(s): "PROBNP" in the last 8760 hours.   D-Dimer No results for input(s): "DDIMER" in the last 72 hours. Hemoglobin A1C No results for input(s): "HGBA1C" in the last 72 hours. Fasting Lipid Panel No results for input(s): "CHOL", "HDL", "LDLCALC", "TRIG", "CHOLHDL", "LDLDIRECT" in the last 72 hours. Thyroid Function Tests No results for input(s): "TSH", "T4TOTAL", "T3FREE", "THYROIDAB" in the last 72 hours.  Invalid input(s): "FREET3"  Other results:  Imaging    No results found.    Medications:     Scheduled Medications:  dapagliflozin propanediol  10 mg Oral Daily   pantoprazole  40 mg Oral QAC breakfast    Infusions:    PRN Medications: acetaminophen **OR** acetaminophen, melatonin, ondansetron (ZOFRAN) IV    Patient Profile  Norman Jones. is a 58 y.o. male with a history of NICM/chronic HFimpEF  (EF as low as 20-25% in 2018 but improved to 45-50% on last Echo in 03/2017), hypertension, CKD stage IIIb, and asthma. Admitted with acute on chronic systolic CHF and severe iron deficiency anemia.     Assessment/Plan  Acute on Chronic Systolic and Diastolic Heart Failure - Etiology uncertain. NICM 2014 EF 27%. No known family history. Heavy ETOH use 1/5th/day. Now with progressive valvular disease and MR. Of note has had elevated pulmonary pressures noted by Echo since 2018.  - Echo 2/18: EF 20-25% w severe RV dysfunction, mod MR and TR, mod to severe PA peak 67 - Echo 2/19: EF  45-50% with mild to mod MR. - Echo 12/24: EF 20-25%, gHK, G3DD, RV nl, severe MR, mod TR, RVSP 61.8.  - RHC 02/05/23: Normal hemodynamics - Ischemic workup deferred d/t renal function - cMRI completed this am. Result pending. - Continue Farxiga 10 mg daily - If Scr further improved will start low-dose ARB - NYHA I. Does not need diuretics. PRN lasix at discharge - GDMT limited by renal function - Held off on beta blocker d/t acute exacerbation. Can add at follow-up.  2. 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  - RHC this admit reassuring: RA 3, PA 34/11 (23), PCWP 12, PAPi 7.7   3. AKI on CKDIII - baseline sCr ~1.4-1.8 back in 2019, new baseline not certain - On admit 1.8 - sCr peaked at 2.5 > 2 yesterday.  - Awaiting AM labs   4. Mitral Regurgitation - severe on Echo 12/24  - May eventually be a clip candidate but needs ischemic workup first   5. Tricuspid Regurgitation - mod on Echo 9/19 and 12/24   6. Iron Deficiency Anemia - Ferritin 9 and T sat 5%. Hgb 6.3 on admission - Received 2 u RBCs. Hgb up to 8.8.  - GI following. EGD: Single angioectasia in stomach treated with APC.Colonoscopy: Polyps removed - Planning IV iron as outpatient   7. Alcohol abuse - Drinking 1/5th of liquor a day - Cessation discussed. He is determined to quit   8. Current smoker - 0.5 ppd  - He states he plans to quit   Has f/u scheduled in HF clinic.  Plans to return to work on 12/23. Works in Consulting civil engineer. Discussed restrictions - no lifting > 30 lb and 10-15 minute rest breaks as needed   ADDENDUM:  -Scr stable at 1.94. Will add on low dose losartan.   Okay for discharge  Length of Stay: 5  Kearie Mennen N, PA-C  02/07/2023, 10:56 AM  Advanced Heart Failure Team Pager [phone] (M-F; 7a - 5p)  Please contact [facility] for night-coverage after hours (5p -7a ) and weekends on  [email or web address]

## 2023-02-08 ENCOUNTER — Telehealth: Payer: Self-pay

## 2023-02-08 ENCOUNTER — Encounter (HOSPITAL_COMMUNITY): Payer: Self-pay | Admitting: Internal Medicine

## 2023-02-08 LAB — SURGICAL PATHOLOGY

## 2023-02-08 NOTE — Transitions of Care (Post Inpatient/ED Visit) (Signed)
   02/08/2023  Name: Norman Jones. MRN: [id number] DOB: 03-03-1965  Today's TOC FU Call Status: Today's TOC FU Call Status:: Successful TOC FU Call Completed TOC FU Call Complete Date: 02/08/23 Patient's Name and Date of Birth confirmed.  Transition Care Management Follow-up Telephone Call Date of Discharge: 02/07/23 Discharge Facility: [facility]) Type of Discharge: Inpatient Admission Primary Inpatient Discharge Diagnosis:: Symptomatic anemia How have you been since you were released from the hospital?: Better Any questions or concerns?: No  Items Reviewed: Did you receive and understand the discharge instructions provided?: Yes Medications obtained,verified, and reconciled?: Yes (Medications Reviewed) Any new allergies since your discharge?: No Dietary orders reviewed?: Yes Type of Diet Ordered:: low sodium heart healthy Do you have support at home?: Yes People in Home: friend(s) Name of Support/Comfort Primary Source: friend to help if needed  Medications Reviewed Today: Medications Reviewed Today   Medications were not reviewed in this encounter     Home Care and Equipment/Supplies: Were Home Health Services Ordered?: No Any new equipment or medical supplies ordered?: No  Functional Questionnaire: Do you need assistance with meal preparation?: No Do you need assistance with eating?: No Do you have difficulty maintaining continence: No Do you need assistance with getting out of bed/getting out of a chair/moving?: No Do you have difficulty managing or taking your medications?: No  Follow up appointments reviewed: PCP Follow-up appointment confirmed?: Yes Date of PCP follow-up appointment?: 03/11/23 Follow-up Provider: Marcine Matar, MD Specialist Hospital Follow-up appointment confirmed?: Yes Date of Specialist follow-up appointment?: 02/25/23 Follow-Up Specialty Provider:: Romie Minus Do you need transportation to your follow-up appointment?:  No Do you understand care options if your condition(s) worsen?: Yes-patient verbalized understanding    SIGNATURE  Elsie Lincoln, RN

## 2023-02-09 ENCOUNTER — Encounter: Payer: Self-pay | Admitting: Internal Medicine

## 2023-02-18 ENCOUNTER — Encounter (HOSPITAL_COMMUNITY): Payer: BC Managed Care – PPO

## 2023-02-18 ENCOUNTER — Other Ambulatory Visit: Payer: Self-pay

## 2023-02-18 MED ORDER — BISMUTH/METRONIDAZ/TETRACYCLIN 140-125-125 MG PO CAPS
3.0000 | ORAL_CAPSULE | Freq: Four times a day (QID) | ORAL | 0 refills | Status: DC
Start: 2023-02-18 — End: 2023-04-02

## 2023-02-19 ENCOUNTER — Ambulatory Visit: Payer: BC Managed Care – PPO

## 2023-02-19 MED ORDER — DIPHENHYDRAMINE HCL 25 MG PO CAPS: 25.0000 mg | ORAL_CAPSULE | Freq: Once | ORAL | Status: AC

## 2023-02-19 MED ORDER — SODIUM CHLORIDE 0.9 % IV SOLN
510.0000 mg | Freq: Once | INTRAVENOUS | Status: AC
  Filled 2023-02-19: qty 17

## 2023-02-19 MED ORDER — ACETAMINOPHEN 325 MG PO TABS
650.0000 mg | ORAL_TABLET | Freq: Once | ORAL | Status: AC
Start: 2023-02-19 — End: ?

## 2023-02-25 ENCOUNTER — Encounter (HOSPITAL_COMMUNITY): Payer: Self-pay | Admitting: Cardiology

## 2023-02-25 ENCOUNTER — Ambulatory Visit (HOSPITAL_COMMUNITY)
Admission: RE | Admit: 2023-02-25 | Discharge: 2023-02-25 | Disposition: A | Discharge status: Home / Self Care | Payer: BC Managed Care – PPO | Source: Ambulatory Visit | Attending: Cardiology | Admitting: Cardiology

## 2023-02-25 VITALS — BP 128/90 | HR 114 | Ht 69.0 in | Wt 145.2 lb

## 2023-02-25 DIAGNOSIS — I5042 Chronic combined systolic (congestive) and diastolic (congestive) heart failure: Secondary | ICD-10-CM | POA: Diagnosis not present

## 2023-02-25 DIAGNOSIS — F17200 Nicotine dependence, unspecified, uncomplicated: Secondary | ICD-10-CM | POA: Insufficient documentation

## 2023-02-25 DIAGNOSIS — Z79899 Other long term (current) drug therapy: Secondary | ICD-10-CM | POA: Diagnosis not present

## 2023-02-25 DIAGNOSIS — I130 Hypertensive heart and chronic kidney disease with heart failure and stage 1 through stage 4 chronic kidney disease, or unspecified chronic kidney disease: Secondary | ICD-10-CM | POA: Insufficient documentation

## 2023-02-25 DIAGNOSIS — R000 Tachycardia, unspecified: Secondary | ICD-10-CM | POA: Insufficient documentation

## 2023-02-25 DIAGNOSIS — N1832 Chronic kidney disease, stage 3b: Secondary | ICD-10-CM | POA: Insufficient documentation

## 2023-02-25 DIAGNOSIS — I340 Nonrheumatic mitral (valve) insufficiency: Secondary | ICD-10-CM | POA: Diagnosis not present

## 2023-02-25 DIAGNOSIS — I428 Other cardiomyopathies: Secondary | ICD-10-CM | POA: Diagnosis not present

## 2023-02-25 DIAGNOSIS — Z91148 Patient's other noncompliance with medication regimen for other reason: Secondary | ICD-10-CM | POA: Diagnosis not present

## 2023-02-25 MED ORDER — ENTRESTO 24-26 MG PO TABS: 1.0000 | ORAL_TABLET | Freq: Two times a day (BID) | ORAL | 3 refills | Status: AC

## 2023-02-25 MED ORDER — METOPROLOL SUCCINATE ER 25 MG PO TB24: 25.0000 mg | ORAL_TABLET | Freq: Every day | ORAL | 3 refills | Status: DC

## 2023-02-25 NOTE — Progress Notes (Signed)
 ADVANCED HEART FAILURE FOLLOW UP CLINIC NOTE  Referring Physician: No ref. provider found  Primary Care: Norman Barnie NOVAK, MD Primary Cardiologist:  HPI: Norman Jones. is a 58 y.o. male who presents for follow up of  NICM, chronic HFrEF (EF as low as 20-25% in 2018 but improved to 45-50% on last Echo in 03/2017), hypertension, CKD stage IIIb, ETOH abuse, tobacco abuse.      Cardiac history dates back to 2014. Echo 2014 showed LVEF 27% w G1DD and mild MR. Had initially done well, however lost is job and was unable to afford medications. Presented to ED in 2018 with worsening function, EF 20-25% w severe RV dysfunction, mod MR and TR, mod to severe PA peak 67. He was restarted on GDMT with EF recovery in 2019 to 45-50% and mild to mod MR.   He was then lost to follow up at that time, and presented to the ED with a chief complaint of chest pain. He was mildly hypertensive and tachycardic and was found to be newly anemic. He underwent EGD/colonoscopy that showed a small gastric AVM but no other clear source of bleeding. He underwent a RHC that showed normal filling pressures as well as preserved cardiac index.        SUBJECTIVE:  Patient overall states that he is doing well since he was discharged. Denies any further chest pain, shortness of breath, dyspnea on exertion, GI bleeding. He is still smokiing occasionally and reports drinking the equivalent of a shot every 3-4 days but would like to quit. No dizziness of lightheadedness. Was taking 25mg  of losartan  instead of 12.5. 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  PMH, current medications, allergies, social history, and family history reviewed in epic.  PHYSICAL EXAM: Vitals:   02/25/23 1400  BP: (!) 128/90  Pulse: (!) 114  SpO2: 100%   GENERAL: Well nourished and in no apparent distress at rest.  HEENT: The mucous membranes are pink and moist.   PULM:  Normal work of breathing, clear to auscultation bilaterally. Respirations are  unlabored.  CARDIAC:  JVP: Not elevated        Tachycardic, systolic murmur best heard at the apex.  ABDOMEN: Soft, non-tender, non-distended. NEUROLOGIC: Patient is oriented x3 with no focal or lateralizing neurologic deficits.  PSYCH: Patients affect is appropriate, there is no evidence of anxiety or depression.  SKIN: Warm and dry; no lesions or wounds. Warm and well perfused extremities.  DATA REVIEW  ECG: 02/25/23: Sinus tachycardia, LVH   ECHO: 02/03/23: LVEF 20-25%, severely dilated LV with severe MR, left atrial size severely dilated  CATH: RHC 01/2023: RA 3, PA 34/11 (23), PCWP 12, TD CO/CI 4.2/2.5.   CMR 01/2023: LVEF 30%, severely dilated, LGE in the basal septum, moderately enlarged RV with reduced function, severe MR  Heart failure review: - Classification: Heart failure with reduced EF - Etiology: Work up ongoing - NYHA Class: I - Volume status: Euvolemic - ACEi/ARB/ARNI: Currently up-titrating - Aldosterone antagonist: Maximally tolerated dose - Beta-blocker: Currently up-titrating - Digoxin: Not indicated - Hydralazine /Nitrates: Not indicated - SGLT2i: Maximally tolerated dose - GLP-1: Consider in future - Advanced therapies: Not needed at this time - ICD: Currently uptitrating GDMT  ASSESSMENT & PLAN:  Chronic systolic heart failure: Patient with history of nonischemic HF with recovered EF, now worsened in the setting of medication nonadherence. He has minimal symptoms currently, no need for diuretics. While tachycardic, is well compensated so will start BB. CMR not consistent  with ischemia, unless he were to develop anginal symptoms can hold off on LHC. - Transition losartan  25mg  to entresto  24/26mg  BID - No spironolactone with renal function, potentially next visit - Continue farxiga  10mg  daily - Start metoprolol  succinate 25mg  daily - BMP in 2 weeks - Continue to titrate GDMT and repeat echo to assess for ICD need  Severe mitral regurgitation: Noted on  previous echo and MRI, secondary due to LV dilation. Minimal symptoms and not optimized on medical therapy, but could consider clip in future. - Continue medical thearpy as above  Alcohol and tobacco abuse: Cutting back significantly, offered medication assistance, will pursue if still smoking at next visit. - Counseling offered, patient ready to quit  Morene Brownie, MD Advanced Heart Failure Mechanical Circulatory Support 02/26/23

## 2023-02-25 NOTE — Patient Instructions (Signed)
 Great to see you today!!!  Medication Changes:  STOP Losartan   START Entresto  24/26 mg Twice daily   START Metoprolol  Succinate 25 mg Daily  Lab Work:  Your physician recommends that you return for lab work in: 2 weeks  Testing/Procedures:  none  Referrals:  none  Special Instructions // Education:  Do the following things EVERYDAY: Weigh yourself in the morning before breakfast. Write it down and keep it in a log. Take your medicines as prescribed Eat low salt foods--Limit salt (sodium) to 2000 mg per day.  Stay as active as you can everyday Limit all fluids for the day to less than 2 liters   Follow-Up in: 2 months  At the [facility], you and your health needs are our priority. We have a designated team specialized in the treatment of Heart Failure. This Care Team includes your primary Heart Failure Specialized Cardiologist (physician), Advanced Practice Providers (APPs- Physician Assistants and Nurse Practitioners), and Pharmacist who all work together to provide you with the care you need, when you need it.   You may see any of the following providers on your designated Care Team at your next follow up:  Dr. Toribio Fuel Dr. Ezra Shuck Dr. Ria Commander Dr. Odis Brownie Greig Mosses, NP Caffie Shed, [place] [facility], [place] Beckey Coe, NP Jordan Lee, NP Tinnie Redman, PharmD   Please be sure to bring in all your medications bottles to every appointment.   Need to Contact Us :  If you have any questions or concerns before your next appointment please send us  a message through [place] or call our office at [phone].    TO LEAVE A MESSAGE FOR THE NURSE SELECT OPTION 2, PLEASE LEAVE A MESSAGE INCLUDING: YOUR NAME DATE OF BIRTH CALL BACK NUMBER REASON FOR CALL**this is important as we prioritize the call backs  YOU WILL RECEIVE A CALL BACK THE SAME DAY AS LONG AS YOU CALL BEFORE 4:00 PM

## 2023-02-26 ENCOUNTER — Ambulatory Visit: Payer: BC Managed Care – PPO

## 2023-02-26 ENCOUNTER — Other Ambulatory Visit (HOSPITAL_COMMUNITY): Payer: Self-pay

## 2023-02-26 MED ORDER — ACETAMINOPHEN 325 MG PO TABS: 650.0000 mg | ORAL_TABLET | Freq: Once | ORAL | Status: AC

## 2023-02-26 MED ORDER — DIPHENHYDRAMINE HCL 25 MG PO CAPS: 25.0000 mg | ORAL_CAPSULE | Freq: Once | ORAL | Status: AC

## 2023-02-26 MED ORDER — SODIUM CHLORIDE 0.9 % IV SOLN
510.0000 mg | Freq: Once | INTRAVENOUS | Status: AC
  Filled 2023-02-26: qty 17

## 2023-03-11 ENCOUNTER — Inpatient Hospital Stay: Payer: BC Managed Care – PPO | Admitting: Internal Medicine

## 2023-03-11 ENCOUNTER — Other Ambulatory Visit (HOSPITAL_COMMUNITY): Payer: BC Managed Care – PPO

## 2023-03-12 ENCOUNTER — Inpatient Hospital Stay: Payer: BC Managed Care – PPO | Admitting: Internal Medicine

## 2023-03-25 ENCOUNTER — Telehealth: Payer: Self-pay | Admitting: Internal Medicine

## 2023-03-25 NOTE — Telephone Encounter (Signed)
-----   Message from Nurse Vernona Rieger B sent at 03/25/2023  1:11 PM EST ----- Regarding: FYI regrading Iron infusions Hi Dr. Rhea Belton,  Patient has no-showed 2 appointments for iron infusions and now will not respond to our phone calls. We will close the referral for now, unless he reaches back out to [place].   Have a good day!  [facility]  [facility] [address] [place] [place], [place] [address] [phone]

## 2023-03-25 NOTE — Telephone Encounter (Signed)
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

## 2023-04-02 ENCOUNTER — Encounter (HOSPITAL_COMMUNITY): Payer: Self-pay

## 2023-04-02 ENCOUNTER — Observation Stay (HOSPITAL_COMMUNITY)
Admission: EM | Admit: 2023-04-02 | Discharge: 2023-04-04 | Disposition: A | Discharge status: Home / Self Care | Payer: BC Managed Care – PPO | Attending: Internal Medicine | Admitting: Internal Medicine

## 2023-04-02 ENCOUNTER — Emergency Department (HOSPITAL_COMMUNITY): Payer: BC Managed Care – PPO

## 2023-04-02 ENCOUNTER — Other Ambulatory Visit: Payer: Self-pay

## 2023-04-02 DIAGNOSIS — Z7984 Long term (current) use of oral hypoglycemic drugs: Secondary | ICD-10-CM | POA: Insufficient documentation

## 2023-04-02 DIAGNOSIS — I130 Hypertensive heart and chronic kidney disease with heart failure and stage 1 through stage 4 chronic kidney disease, or unspecified chronic kidney disease: Secondary | ICD-10-CM | POA: Insufficient documentation

## 2023-04-02 DIAGNOSIS — N1832 Chronic kidney disease, stage 3b: Secondary | ICD-10-CM | POA: Insufficient documentation

## 2023-04-02 DIAGNOSIS — Z1152 Encounter for screening for COVID-19: Secondary | ICD-10-CM | POA: Diagnosis not present

## 2023-04-02 DIAGNOSIS — Z79899 Other long term (current) drug therapy: Secondary | ICD-10-CM | POA: Diagnosis not present

## 2023-04-02 DIAGNOSIS — D649 Anemia, unspecified: Secondary | ICD-10-CM | POA: Diagnosis not present

## 2023-04-02 DIAGNOSIS — I5042 Chronic combined systolic (congestive) and diastolic (congestive) heart failure: Secondary | ICD-10-CM | POA: Diagnosis not present

## 2023-04-02 DIAGNOSIS — F17210 Nicotine dependence, cigarettes, uncomplicated: Secondary | ICD-10-CM | POA: Insufficient documentation

## 2023-04-02 DIAGNOSIS — K31819 Angiodysplasia of stomach and duodenum without bleeding: Secondary | ICD-10-CM | POA: Insufficient documentation

## 2023-04-02 DIAGNOSIS — I509 Heart failure, unspecified: Secondary | ICD-10-CM

## 2023-04-02 DIAGNOSIS — D62 Acute posthemorrhagic anemia: Principal | ICD-10-CM | POA: Insufficient documentation

## 2023-04-02 DIAGNOSIS — R0602 Shortness of breath: Secondary | ICD-10-CM | POA: Diagnosis present

## 2023-04-02 DIAGNOSIS — J45909 Unspecified asthma, uncomplicated: Secondary | ICD-10-CM | POA: Diagnosis not present

## 2023-04-02 LAB — CBC
HCT: 26.7 % — ABNORMAL LOW (ref 39.0–52.0)
Hemoglobin: 7 g/dL — ABNORMAL LOW (ref 13.0–17.0)
MCH: 18.1 pg — ABNORMAL LOW (ref 26.0–34.0)
MCHC: 26.2 g/dL — ABNORMAL LOW (ref 30.0–36.0)
MCV: 69.2 fL — ABNORMAL LOW (ref 80.0–100.0)
Platelets: 414 10*3/uL — ABNORMAL HIGH (ref 150–400)
RBC: 3.86 MIL/uL — ABNORMAL LOW (ref 4.22–5.81)
RDW: 22.1 % — ABNORMAL HIGH (ref 11.5–15.5)
WBC: 6.9 10*3/uL (ref 4.0–10.5)
nRBC: 0 % (ref 0.0–0.2)

## 2023-04-02 LAB — BRAIN NATRIURETIC PEPTIDE: B Natriuretic Peptide: 2698.5 pg/mL — ABNORMAL HIGH (ref 0.0–100.0)

## 2023-04-02 LAB — BASIC METABOLIC PANEL
Anion gap: 11 (ref 5–15)
BUN: 25 mg/dL — ABNORMAL HIGH (ref 6–20)
CO2: 22 mmol/L (ref 22–32)
Calcium: 8.8 mg/dL — ABNORMAL LOW (ref 8.9–10.3)
Chloride: 105 mmol/L (ref 98–111)
Creatinine, Ser: 1.94 mg/dL — ABNORMAL HIGH (ref 0.61–1.24)
GFR, Estimated: 39 mL/min — ABNORMAL LOW (ref >60)
Glucose, Bld: 110 mg/dL — ABNORMAL HIGH (ref 70–99)
Potassium: 4.4 mmol/L (ref 3.5–5.1)
Sodium: 138 mmol/L (ref 135–145)

## 2023-04-02 LAB — RESP PANEL BY RT-PCR (RSV, FLU A&B, COVID)  RVPGX2
Influenza A by PCR: NEGATIVE
Influenza B by PCR: NEGATIVE
Resp Syncytial Virus by PCR: NEGATIVE
SARS Coronavirus 2 by RT PCR: NEGATIVE

## 2023-04-02 MED ORDER — FUROSEMIDE 10 MG/ML IJ SOLN
40.0000 mg | Freq: Once | INTRAMUSCULAR | Status: AC
  Administered 2023-04-02: 40 mg via INTRAVENOUS
  Filled 2023-04-02: qty 4

## 2023-04-02 MED ORDER — SODIUM CHLORIDE 0.9 % IV SOLN
100.0000 mg | Freq: Once | INTRAVENOUS | Status: AC
  Administered 2023-04-02: 100 mg via INTRAVENOUS
  Filled 2023-04-02: qty 5

## 2023-04-02 NOTE — ED Triage Notes (Signed)
Reports hx of CHF and feeling more short of breath with exertion over past couple weeks. Denies sick exposure or fever.  Denies chest pain.

## 2023-04-02 NOTE — H&P (Incomplete)
History and Physical  Norman Jones. GNF:[id number] DOB: Jul 23, 1965 DOA: 04/02/2023  Referring physician: Dr. Lynelle Doctor, EDP  PCP: Marcine Matar, MD  Outpatient Specialists: Cardiology Patient coming from: Home  Chief Complaint: Shortness of breath   HPI: Norman Jones. is a 58 y.o. male with medical history significant for chronic combined diastolic and systolic CHF, hypertension, CKD 3B, history of alcohol and tobacco abuse, history of H. pylori gastritis, who presented to the ER with complaints of progressively worsening shortness of breath, worse with exertion.  Started noticing his symptoms 2 weeks ago when he would get short of breath at work.  Denies any chest pain.  Denies any NSAID use.  Denies any abdominal pain or overt bleeding.  He presented to the ER for further evaluation.  In the ER, tachycardic.  Lab work notable for hemoglobin of 7.0 with MCV of 69, BNP greater than 2600.  The patient received a dose of IV Lasix 40 mg x 1 in the ER.  TRH, hospitalist service, was asked to admit due to concern for symptomatic anemia and acute on chronic combined diastolic and systolic CHF.  ED Course: Temperature 98.6.  BP 132/86, pulse 90.  Respiration rate 17, O2 saturation 100% on room air.  Lab studies notable for BUN 25, creatinine 1.94, GFR 39, BNP 2698.  Hemoglobin 7.0, platelet count 414, WBC 6.9.  Review of Systems: Review of systems as noted in the HPI. All other systems reviewed and are negative.   Past Medical History:  Diagnosis Date   Asthma    CHF (congestive heart failure) (HCC) 09/29/2012   Hypertension    Seasonal allergies    Past Surgical History:  Procedure Laterality Date   BIOPSY  02/06/2023   Procedure: BIOPSY;  Surgeon: Beverley Fiedler, MD;  Location: [facility] ENDOSCOPY;  Service: Gastroenterology;;   COLONOSCOPY WITH PROPOFOL N/A 02/06/2023   Procedure: COLONOSCOPY WITH PROPOFOL;  Surgeon: Beverley Fiedler, MD;  Location: [facility] ENDOSCOPY;  Service: Gastroenterology;   Laterality: N/A;   ESOPHAGOGASTRODUODENOSCOPY (EGD) WITH PROPOFOL N/A 02/06/2023   Procedure: ESOPHAGOGASTRODUODENOSCOPY (EGD) WITH PROPOFOL;  Surgeon: Beverley Fiedler, MD;  Location: [facility] ENDOSCOPY;  Service: Gastroenterology;  Laterality: N/A;   HOT HEMOSTASIS N/A 02/06/2023   Procedure: HOT HEMOSTASIS (ARGON PLASMA COAGULATION/BICAP);  Surgeon: Beverley Fiedler, MD;  Location: [facility] ENDOSCOPY;  Service: Gastroenterology;  Laterality: N/A;   POLYPECTOMY  02/06/2023   Procedure: POLYPECTOMY;  Surgeon: Beverley Fiedler, MD;  Location: [facility] ENDOSCOPY;  Service: Gastroenterology;;   RIGHT HEART CATH N/A 02/05/2023   Procedure: RIGHT HEART CATH;  Surgeon: Dolores Patty, MD;  Location: [facility] INVASIVE CV LAB;  Service: Cardiovascular;  Laterality: N/A;    Social History:  reports that he has been smoking cigarettes. He started smoking about 39 years ago. He has a 19.6 pack-year smoking history. He has never used smokeless tobacco. He reports current alcohol use. He reports current drug use. Drug: Marijuana.   Allergies  Allergen Reactions   Penicillins Other (See Comments)    Pt states he was told he was allergic by his mother, unknown reaction.    Family History  Problem Relation Age of Onset   Cancer Mother        Breast cancer   Diabetes Maternal Grandmother       Prior to Admission medications   Medication Sig Start Date End Date Taking? Authorizing Provider  Bismuth/Metronidaz/Tetracyclin [facility]) [phone] MG CAPS Take 3 capsules by mouth 4 (four) times daily. 02/18/23   Pyrtle,  Carie Caddy, MD  dapagliflozin propanediol (FARXIGA) 10 MG TABS tablet Take 1 tablet (10 mg total) by mouth daily. 02/08/23   Andrey Farmer, PA-C  furosemide (LASIX) 40 MG tablet Take 1 tablet (40 mg total) by mouth as needed. For weight gain more than 3 lb in a day or 5 lb in a week, shortness of breath or edema 02/07/23 02/07/24  Andrey Farmer, PA-C  metoprolol succinate (TOPROL-XL) 25 MG 24 hr tablet Take  1 tablet (25 mg total) by mouth daily. 02/25/23   Romie Minus, MD  potassium chloride SA (KLOR-CON M) 20 MEQ tablet Take 2 tablets (40 mEq total) by mouth as needed. When taking lasix 02/07/23   Andrey Farmer, PA-C  sacubitril-valsartan (ENTRESTO) 24-26 MG Take 1 tablet by mouth 2 (two) times daily. 02/25/23   Romie Minus, MD    Physical Exam: BP 126/84   Pulse 88   Temp 97.7 F (36.5 C) (Oral)   Resp 17   Ht 5\' 9"  (1.753 m)   Wt 65.8 kg   SpO2 99%   BMI 21.41 kg/m   General: 59 y.o. year-old male well developed well nourished in no acute distress.  Alert and oriented x3. Cardiovascular: Regular rate and rhythm with no rubs or gallops.  No thyromegaly or JVD noted.  No lower extremity edema. 2/4 pulses in all 4 extremities. Respiratory: Clear to auscultation with no wheezes or rales. Good inspiratory effort. Abdomen: Soft nontender nondistended with normal bowel sounds x4 quadrants. Muskuloskeletal: No cyanosis, clubbing or edema noted bilaterally Neuro: CN II-XII intact, strength, sensation, reflexes Skin: No ulcerative lesions noted or rashes Psychiatry: Judgement and insight appear normal. Mood is appropriate for condition and setting          Labs on Admission:  Basic Metabolic Panel: Recent Labs  Lab 04/02/23 1606  NA 138  K 4.4  CL 105  CO2 22  GLUCOSE 110*  BUN 25*  CREATININE 1.94*  CALCIUM 8.8*   Liver Function Tests: No results for input(s): "AST", "ALT", "ALKPHOS", "BILITOT", "PROT", "ALBUMIN" in the last 168 hours. No results for input(s): "LIPASE", "AMYLASE" in the last 168 hours. No results for input(s): "AMMONIA" in the last 168 hours. CBC: Recent Labs  Lab 04/02/23 1606  WBC 6.9  HGB 7.0*  HCT 26.7*  MCV 69.2*  PLT 414*   Cardiac Enzymes: No results for input(s): "CKTOTAL", "CKMB", "CKMBINDEX", "TROPONINI" in the last 168 hours.  BNP (last 3 results) Recent Labs    02/03/23 0737 02/05/23 0828 04/02/23 1606  BNP  2,756.5* 1,010.9* 2,698.5*    ProBNP (last 3 results) No results for input(s): "PROBNP" in the last 8760 hours.  CBG: No results for input(s): "GLUCAP" in the last 168 hours.  Radiological Exams on Admission: DG Chest 2 View Result Date: 04/02/2023 CLINICAL DATA:  Shortness of breath EXAM: CHEST - 2 VIEW COMPARISON:  X-ray and CTA 02/01/2023.  Older exams as well FINDINGS: No consolidation, pneumothorax or effusion. No edema. Borderline stable cardiopericardial silhouette. IMPRESSION: Borderline size heart.  No acute cardiopulmonary disease. Electronically Signed   By: Karen Kays M.D.   On: 04/02/2023 17:47    EKG: I independently viewed the EKG done and my findings are as followed: Normal sinus rhythm rate of 94.  Nonspecific ST-T changes.  QTc 470.  Assessment/Plan Present on Admission:  Symptomatic anemia  Principal Problem:   Symptomatic anemia  Symptomatic anemia History of severe iron deficiency with anemia Presented with hemoglobin of 7.0,  dyspnea with minimal exertion IV iron ordered to be administered this morning Repeat CBC in the morning, transfuse if repeat hemoglobin less than 8. FOBT to rule out chronic GI blood loss  Chronic combined diastolic and systolic CHF Last 2D echo done on 02/03/2023 revealed LVEF 20 to 25% with grade 3 diastolic dysfunction. BNP greater than 2600 IV diuresis started in the ED, continue Resume home Toprol XL at lowest dose to avoid hypotension Monitor urine output Start strict I's and O's and daily weight  CKD 3B Appears to be at his baseline renal function Avoid nephrotoxic agents and hypotension. Monitor urine output  History of alcohol and tobacco abuse Recommend complete cessation No evidence of substance withdrawal at this time  History of angiodysplasia of stomach and duodenum 1 single nonbleeding angioectasia in the stomach seen on upper endoscopy done on 02/03/2023 Denies overt bleeding. Start IV PPI daily, continue  PPI until hemoglobin is stable and chronic GI bleed is ruled out. Continue to closely monitor H&H Will need to closely follow-up with GI  History of H. pylori gastritis Unclear if he completed his H. pylori treatment Will need to closely follow-up with GI    Critical care time: 55 minutes.    DVT prophylaxis: SCDs.  Chemical DVT prophylaxis held due to symptomatic anemia, until chronic GI blood loss is ruled out.  Code Status: Full code.  Family Communication: None at bedside.  Disposition Plan: Admitted to telemetry cardiac unit.  Consults called: None.  Admission status: Inpatient status.   Status is: Inpatient The patient requires at least 2 midnights for further evaluation and treatment of present condition.   Darlin Drop MD [facility] Pager [phone]  If 7PM-7AM, please contact night-coverage [email or web address] Password [facility]  04/02/2023, 7:32 PM

## 2023-04-02 NOTE — ED Provider Notes (Signed)
[place] EMERGENCY DEPARTMENT AT [facility] Provider Note   CSN: [id number] Arrival date & time: 04/02/23  1555     History  Chief Complaint  Patient presents with   Shortness of Breath    Norman Jones. is a 58 y.o. male.   Shortness of Breath    Pt states he felt short of breath this am.  It has improved throughout the day.  No fever or cough.  No leg swelling.     NO blood in the stool.  No dark colored stool.  Pt does have CHF.  No weight gain.  Worse with lying flat on his back. Previous records reviewed.  Patient noted to have a history of iron deficiency anemia related to intestinal angioectasias.  Per notes in the EMR patient was supposed to have iron infusions.  Patient had not followed up with the iron infusion center.  Home Medications Prior to Admission medications   Medication Sig Start Date End Date Taking? Authorizing Provider  Bismuth/Metronidaz/Tetracyclin [facility]) [phone] MG CAPS Take 3 capsules by mouth 4 (four) times daily. 02/18/23   Pyrtle, Carie Caddy, MD  dapagliflozin propanediol (FARXIGA) 10 MG TABS tablet Take 1 tablet (10 mg total) by mouth daily. 02/08/23   Andrey Farmer, PA-C  furosemide (LASIX) 40 MG tablet Take 1 tablet (40 mg total) by mouth as needed. For weight gain more than 3 lb in a day or 5 lb in a week, shortness of breath or edema 02/07/23 02/07/24  Andrey Farmer, PA-C  metoprolol succinate (TOPROL-XL) 25 MG 24 hr tablet Take 1 tablet (25 mg total) by mouth daily. 02/25/23   Romie Minus, MD  potassium chloride SA (KLOR-CON M) 20 MEQ tablet Take 2 tablets (40 mEq total) by mouth as needed. When taking lasix 02/07/23   Andrey Farmer, PA-C  sacubitril-valsartan (ENTRESTO) 24-26 MG Take 1 tablet by mouth 2 (two) times daily. 02/25/23   Romie Minus, MD      Allergies    Penicillins    Review of Systems   Review of Systems  Respiratory:  Positive for shortness of breath.     Physical  Exam Updated Vital Signs BP 126/84   Pulse 88   Temp 97.7 F (36.5 C) (Oral)   Resp 17   Ht 1.753 m (5\' 9" )   Wt 65.8 kg   SpO2 99%   BMI 21.41 kg/m  Physical Exam Vitals and nursing note reviewed.  Constitutional:      Appearance: He is well-developed. He is not diaphoretic.  HENT:     Head: Normocephalic and atraumatic.     Right Ear: External ear normal.     Left Ear: External ear normal.  Eyes:     General: No scleral icterus.       Right eye: No discharge.        Left eye: No discharge.     Conjunctiva/sclera: Conjunctivae normal.  Neck:     Trachea: No tracheal deviation.  Cardiovascular:     Rate and Rhythm: Normal rate and regular rhythm.  Pulmonary:     Effort: Pulmonary effort is normal. No respiratory distress.     Breath sounds: Normal breath sounds. No stridor. No wheezing or rales.  Abdominal:     General: Bowel sounds are normal. There is no distension.     Palpations: Abdomen is soft.     Tenderness: There is no abdominal tenderness. There is no guarding or rebound.  Musculoskeletal:  General: No tenderness or deformity.     Cervical back: Neck supple.  Skin:    General: Skin is warm and dry.     Findings: No rash.  Neurological:     General: No focal deficit present.     Mental Status: He is alert.     Cranial Nerves: No cranial nerve deficit, dysarthria or facial asymmetry.     Sensory: No sensory deficit.     Motor: No abnormal muscle tone or seizure activity.     Coordination: Coordination normal.  Psychiatric:        Mood and Affect: Mood normal.     ED Results / Procedures / Treatments   Labs (all labs ordered are listed, but only abnormal results are displayed) Labs Reviewed  BASIC METABOLIC PANEL - Abnormal; Notable for the following components:      Result Value   Glucose, Bld 110 (*)    BUN 25 (*)    Creatinine, Ser 1.94 (*)    Calcium 8.8 (*)    GFR, Estimated 39 (*)    All other components within normal limits  CBC -  Abnormal; Notable for the following components:   RBC 3.86 (*)    Hemoglobin 7.0 (*)    HCT 26.7 (*)    MCV 69.2 (*)    MCH 18.1 (*)    MCHC 26.2 (*)    RDW 22.1 (*)    Platelets 414 (*)    All other components within normal limits  BRAIN NATRIURETIC PEPTIDE - Abnormal; Notable for the following components:   B Natriuretic Peptide 2,698.5 (*)    All other components within normal limits  RESP PANEL BY RT-PCR (RSV, FLU A&B, COVID)  RVPGX2  TYPE AND SCREEN    EKG None  Radiology DG Chest 2 View Result Date: 04/02/2023 CLINICAL DATA:  Shortness of breath EXAM: CHEST - 2 VIEW COMPARISON:  X-ray and CTA 02/01/2023.  Older exams as well FINDINGS: No consolidation, pneumothorax or effusion. No edema. Borderline stable cardiopericardial silhouette. IMPRESSION: Borderline size heart.  No acute cardiopulmonary disease. Electronically Signed   By: Karen Kays M.D.   On: 04/02/2023 17:47    Procedures .Critical Care  Performed by: Linwood Dibbles, MD Authorized by: Linwood Dibbles, MD   Critical care provider statement:    Critical care time (minutes):  30   Critical care was time spent personally by me on the following activities:  Development of treatment plan with patient or surrogate, discussions with consultants, evaluation of patient's response to treatment, examination of patient, ordering and review of laboratory studies, ordering and review of radiographic studies, ordering and performing treatments and interventions, pulse oximetry, re-evaluation of patient's condition and review of old charts     Medications Ordered in ED Medications  furosemide (LASIX) injection 40 mg (40 mg Intravenous Given 04/02/23 1900)    ED Course/ Medical Decision Making/ A&P Clinical Course as of 04/02/23 1930  Tue Apr 02, 2023  1928 Case discussed with Dr. Margo Aye [JK]    Clinical Course User Index [JK] Linwood Dibbles, MD                                 Medical Decision Making Problems Addressed: Anemia,  unspecified type: chronic illness or injury with exacerbation, progression, or side effects of treatment Congestive heart failure, unspecified HF chronicity, unspecified heart failure type [facility]): chronic illness or injury with exacerbation, progression, or side effects  of treatment  Amount and/or Complexity of Data Reviewed Labs: ordered. Decision-making details documented in ED Course. Radiology: ordered and independent interpretation performed.  Risk Prescription drug management.   Patient has history of iron deficiency anemia as well as CHF.  He reports increasing shortness of breath orthopnea.  Patient's laboratory tests do show increasing BNP now up to 2698.  Patient's hemoglobin also has been decreasing.  1 month ago it was 8.9.  Now it is down to 7.0.  Patient denies any blood in his stools however he does have history of GI bleeding per records.  With his increasing shortness of breath I think he would benefit from IV diuresis overnight.  I think the patient's worsening anemia could also be contributing to his symptoms although he is not having dyspnea on exertion.  With his hemoglobin down to 7.0 he may benefit from IV blood transfusion at this time.  I will consult the medical service for admission        Final Clinical Impression(s) / ED Diagnoses Final diagnoses:  Anemia, unspecified type  Congestive heart failure, unspecified HF chronicity, unspecified heart failure type [facility])    Rx / DC Orders ED Discharge Orders     None         Linwood Dibbles, MD 04/02/23 1931

## 2023-04-03 DIAGNOSIS — D649 Anemia, unspecified: Secondary | ICD-10-CM | POA: Diagnosis not present

## 2023-04-03 LAB — CBC
HCT: 25 % — ABNORMAL LOW (ref 39.0–52.0)
Hemoglobin: 6.7 g/dL — CL (ref 13.0–17.0)
MCH: 18.2 pg — ABNORMAL LOW (ref 26.0–34.0)
MCHC: 26.8 g/dL — ABNORMAL LOW (ref 30.0–36.0)
MCV: 67.8 fL — ABNORMAL LOW (ref 80.0–100.0)
Platelets: 339 10*3/uL (ref 150–400)
RBC: 3.69 MIL/uL — ABNORMAL LOW (ref 4.22–5.81)
RDW: 22 % — ABNORMAL HIGH (ref 11.5–15.5)
WBC: 7.9 10*3/uL (ref 4.0–10.5)
nRBC: 0.3 % — ABNORMAL HIGH (ref 0.0–0.2)

## 2023-04-03 LAB — HEMOGLOBIN AND HEMATOCRIT, BLOOD
HCT: 31.8 % — ABNORMAL LOW (ref 39.0–52.0)
Hemoglobin: 8.7 g/dL — ABNORMAL LOW (ref 13.0–17.0)

## 2023-04-03 LAB — COMPREHENSIVE METABOLIC PANEL
ALT: 13 U/L (ref 0–44)
AST: 29 U/L (ref 15–41)
Albumin: 3 g/dL — ABNORMAL LOW (ref 3.5–5.0)
Alkaline Phosphatase: 73 U/L (ref 38–126)
Anion gap: 12 (ref 5–15)
BUN: 22 mg/dL — ABNORMAL HIGH (ref 6–20)
CO2: 22 mmol/L (ref 22–32)
Calcium: 8.5 mg/dL — ABNORMAL LOW (ref 8.9–10.3)
Chloride: 103 mmol/L (ref 98–111)
Creatinine, Ser: 1.95 mg/dL — ABNORMAL HIGH (ref 0.61–1.24)
GFR, Estimated: 39 mL/min — ABNORMAL LOW (ref >60)
Glucose, Bld: 86 mg/dL (ref 70–99)
Potassium: 3.9 mmol/L (ref 3.5–5.1)
Sodium: 137 mmol/L (ref 135–145)
Total Bilirubin: 1 mg/dL (ref 0.0–1.2)
Total Protein: 5.7 g/dL — ABNORMAL LOW (ref 6.5–8.1)

## 2023-04-03 LAB — HIV ANTIBODY (ROUTINE TESTING W REFLEX): HIV Screen 4th Generation wRfx: NONREACTIVE

## 2023-04-03 LAB — MAGNESIUM: Magnesium: 1.9 mg/dL (ref 1.7–2.4)

## 2023-04-03 LAB — PHOSPHORUS: Phosphorus: 4.1 mg/dL (ref 2.5–4.6)

## 2023-04-03 LAB — PREPARE RBC (CROSSMATCH)

## 2023-04-03 MED ORDER — FUROSEMIDE 10 MG/ML IJ SOLN
20.0000 mg | Freq: Two times a day (BID) | INTRAMUSCULAR | Status: AC
  Administered 2023-04-03 (×2): 20 mg via INTRAVENOUS
  Filled 2023-04-03 (×2): qty 2

## 2023-04-03 MED ORDER — MELATONIN 5 MG PO TABS: 5.0000 mg | ORAL_TABLET | Freq: Every evening | ORAL | Status: DC | PRN

## 2023-04-03 MED ORDER — POLYETHYLENE GLYCOL 3350 17 G PO PACK: 17.0000 g | PACK | Freq: Every day | ORAL | Status: DC | PRN

## 2023-04-03 MED ORDER — ACETAMINOPHEN 325 MG PO TABS: 650.0000 mg | ORAL_TABLET | Freq: Four times a day (QID) | ORAL | Status: DC | PRN

## 2023-04-03 MED ORDER — PANTOPRAZOLE SODIUM 40 MG IV SOLR
40.0000 mg | Freq: Every day | INTRAVENOUS | Status: DC
  Administered 2023-04-03 – 2023-04-04 (×2): 40 mg via INTRAVENOUS
  Filled 2023-04-03 (×2): qty 10

## 2023-04-03 MED ORDER — PROCHLORPERAZINE EDISYLATE 10 MG/2ML IJ SOLN: 5.0000 mg | Freq: Four times a day (QID) | INTRAMUSCULAR | Status: DC | PRN

## 2023-04-03 MED ORDER — METOPROLOL SUCCINATE ER 25 MG PO TB24
12.5000 mg | ORAL_TABLET | Freq: Every day | ORAL | Status: DC
  Administered 2023-04-03 – 2023-04-04 (×2): 12.5 mg via ORAL
  Filled 2023-04-03 (×2): qty 1

## 2023-04-03 MED ORDER — SODIUM CHLORIDE 0.9% IV SOLUTION: Freq: Once | INTRAVENOUS | Status: AC

## 2023-04-03 NOTE — Progress Notes (Signed)
Heart Failure Navigator Progress Note  Assessed for Heart & Vascular TOC clinic readiness.  Patient does not meet criteria due to Advanced Heart Failure Team patient of Dr. Elwyn Lade. .   Navigator will sign off at this time.    Rhae Hammock, BSN, Scientist, clinical (histocompatibility and immunogenetics) Only

## 2023-04-03 NOTE — Discharge Planning (Signed)
Code 44 initiated in error.  Pt has Nurse, learning disability and does not require Code 44 for changing from inpatient to observation.

## 2023-04-03 NOTE — Progress Notes (Signed)
PROGRESS NOTE    Norman Jones.  ION:[id number] DOB: Aug 18, 1965 DOA: 04/02/2023 PCP: Marcine Matar, MD   Brief Narrative: This 58 yrs old male with PMH significant for chronic combined diastolic and systolic CHF, hypertension, CKD 3b, history of alcohol and tobacco abuse, history of H. pylori gastritis, who presented to the ED with complaints of progressively worsening shortness of breath, worse with exertion. He started noticing his symptoms 2 weeks ago when he would get short of breath at work.  Denies any chest pain.  Denies any NSAID use.  Denies any abdominal pain or overt bleeding.  He presented to the ER for further evaluation.  Lab work significant for hemoglobin 7.6.  BNP 2600.  Patient was given a dose of IV Lasix and admitted for acute on chronic symptomatic anemia and acute on chronic combined systolic and diastolic CHF.   Assessment & Plan:   Principal Problem:   Symptomatic anemia   Acute on chronic symptomatic anemia: History of severe iron deficiency with anemia: Patient presented with Hb of 7.0, dyspnea with minimal exertion. Patient received dose of IV iron. Repeat CBC showed hemoglobin down to 6.7. Transfuse 1 unit PRBC.  Follow-up H&H. FOBT to rule out chronic GI blood loss.   Chronic combined diastolic and systolic CHF: Last 2D echo done on 02/03/2023 revealed LVEF 20 to 25% with grade 3 diastolic dysfunction. BNP greater than 2600. Continue IV diuresis.  Monitor intake output charting.  Daily weight Resume Toprol XL at lowest dose to avoid hypotension. Monitor urine output.  CKD IIIB: Appears to be at his baseline renal function. Avoid nephrotoxic agents and hypotension. Monitor urine output   History of alcohol and tobacco abuse: Recommend complete cessation No evidence of substance withdrawal at this time.   History of angiodysplasia of stomach and duodenum:  single nonbleeding angioectasia in the stomach seen on upper endoscopy done on  02/03/2023 Denies overt bleeding. Start IV PPI daily, continue PPI until hemoglobin is stable and chronic GI bleed is ruled out. Continue to closely monitor H&H. Will need to closely follow-up with GI   History of H. pylori gastritis: Unclear if he completed his H. pylori treatment Will need to closely follow-up with GI.    DVT prophylaxis: SCDs Code Status: Full code Family Communication: No family at bed side Disposition Plan:   Status is: Observation The patient remains OBS appropriate and will d/c before 2 midnights.   Admitted for acute on chronic symptomatic anemia, acute on chronic combined systolic and diastolic CHF  Consultants:  None  Procedures: None  Antimicrobials: None   Subjective: Patient was seen and examined at bedside.  Overnight events noted.   Patient reports doing better.  He was getting 1 unit of blood transfusion.  Objective: Vitals:   04/03/23 1300 04/03/23 1400 04/03/23 1455 04/03/23 1530  BP: 124/81 117/63  119/73  Pulse: 66 77  71  Resp: 11 15  19   Temp:   97.8 F (36.6 C)   TempSrc:      SpO2: 98% 98%  97%  Weight:      Height:        Intake/Output Summary (Last 24 hours) at 04/03/2023 1608 Last data filed at 04/03/2023 1100 Gross per 24 hour  Intake 405.12 ml  Output 800 ml  Net -394.88 ml   Filed Weights   04/02/23 1603  Weight: 65.8 kg    Examination:  General exam: Appears calm and comfortable, not in any acute distress. Respiratory system: Clear to  auscultation. Respiratory effort normal.  RR 16. Cardiovascular system: S1 & S2 heard, RRR. No JVD, murmurs, rubs, gallops or clicks.  Gastrointestinal system: Abdomen is non distended, soft and non tender.Normal bowel sounds heard. Central nervous system: Alert and oriented X 3. No focal neurological deficits. Extremities: No edema, no cyanosis, no clubbing Skin: No rashes, lesions or ulcers Psychiatry: Judgement and insight appear normal. Mood & affect appropriate.      Data Reviewed: I have personally reviewed following labs and imaging studies  CBC: Recent Labs  Lab 04/02/23 1606 04/03/23 0623 04/03/23 1426  WBC 6.9 7.9  --   HGB 7.0* 6.7* 8.7*  HCT 26.7* 25.0* 31.8*  MCV 69.2* 67.8*  --   PLT 414* 339  --    Basic Metabolic Panel: Recent Labs  Lab 04/02/23 1606 04/03/23 0623  NA 138 137  K 4.4 3.9  CL 105 103  CO2 22 22  GLUCOSE 110* 86  BUN 25* 22*  CREATININE 1.94* 1.95*  CALCIUM 8.8* 8.5*  MG  --  1.9  PHOS  --  4.1   GFR: Estimated Creatinine Clearance: 38 mL/min (A) (by C-G formula based on SCr of 1.95 mg/dL (H)). Liver Function Tests: Recent Labs  Lab 04/03/23 0623  AST 29  ALT 13  ALKPHOS 73  BILITOT 1.0  PROT 5.7*  ALBUMIN 3.0*   No results for input(s): "LIPASE", "AMYLASE" in the last 168 hours. No results for input(s): "AMMONIA" in the last 168 hours. Coagulation Profile: No results for input(s): "INR", "PROTIME" in the last 168 hours. Cardiac Enzymes: No results for input(s): "CKTOTAL", "CKMB", "CKMBINDEX", "TROPONINI" in the last 168 hours. BNP (last 3 results) No results for input(s): "PROBNP" in the last 8760 hours. HbA1C: No results for input(s): "HGBA1C" in the last 72 hours. CBG: No results for input(s): "GLUCAP" in the last 168 hours. Lipid Profile: No results for input(s): "CHOL", "HDL", "LDLCALC", "TRIG", "CHOLHDL", "LDLDIRECT" in the last 72 hours. Thyroid Function Tests: No results for input(s): "TSH", "T4TOTAL", "FREET4", "T3FREE", "THYROIDAB" in the last 72 hours. Anemia Panel: No results for input(s): "VITAMINB12", "FOLATE", "FERRITIN", "TIBC", "IRON", "RETICCTPCT" in the last 72 hours. Sepsis Labs: No results for input(s): "PROCALCITON", "LATICACIDVEN" in the last 168 hours.  Recent Results (from the past 240 hours)  Resp panel by RT-PCR (RSV, Flu A&B, Covid) Anterior Nasal Swab     Status: None   Collection Time: 04/02/23  4:06 PM   Specimen: Anterior Nasal Swab  Result Value  Ref Range Status   SARS Coronavirus 2 by RT PCR NEGATIVE NEGATIVE Final   Influenza A by PCR NEGATIVE NEGATIVE Final   Influenza B by PCR NEGATIVE NEGATIVE Final    Comment: (NOTE) The Xpert Xpress SARS-CoV-2/FLU/RSV plus assay is intended as an aid in the diagnosis of influenza from Nasopharyngeal swab specimens and should not be used as a sole basis for treatment. Nasal washings and aspirates are unacceptable for Xpert Xpress SARS-CoV-2/FLU/RSV testing.  Fact Sheet for Patients: BloggerCourse.com  Fact Sheet for Healthcare Providers: SeriousBroker.it  This test is not yet approved or cleared by the [place] FDA and has been authorized for detection and/or diagnosis of SARS-CoV-2 by FDA under an Emergency Use Authorization (EUA). This EUA will remain in effect (meaning this test can be used) for the duration of the COVID-19 declaration under Section 564(b)(1) of the Act, 21 U.S.C. section 360bbb-3(b)(1), unless the authorization is terminated or revoked.     Resp Syncytial Virus by PCR NEGATIVE NEGATIVE Final  Comment: (NOTE) Fact Sheet for Patients: BloggerCourse.com  Fact Sheet for Healthcare Providers: SeriousBroker.it  This test is not yet approved or cleared by the [place] FDA and has been authorized for detection and/or diagnosis of SARS-CoV-2 by FDA under an Emergency Use Authorization (EUA). This EUA will remain in effect (meaning this test can be used) for the duration of the COVID-19 declaration under Section 564(b)(1) of the Act, 21 U.S.C. section 360bbb-3(b)(1), unless the authorization is terminated or revoked.  Performed at [facility], [address], [place], [place] [address]     Radiology Studies: DG Chest 2 View Result Date: 04/02/2023 CLINICAL DATA:  Shortness of breath EXAM: CHEST - 2 VIEW COMPARISON:  X-ray and CTA 02/01/2023.   Older exams as well FINDINGS: No consolidation, pneumothorax or effusion. No edema. Borderline stable cardiopericardial silhouette. IMPRESSION: Borderline size heart.  No acute cardiopulmonary disease. Electronically Signed   By: Karen Kays M.D.   On: 04/02/2023 17:47   Scheduled Meds:  furosemide  20 mg Intravenous BID   metoprolol succinate  12.5 mg Oral Daily   pantoprazole (PROTONIX) IV  40 mg Intravenous Daily   Continuous Infusions:   LOS: 1 day    Time spent: 50 mins    Willeen Niece, MD [facility]   If 7PM-7AM, please contact night-coverage

## 2023-04-03 NOTE — Plan of Care (Signed)
Problem: Education: Goal: Knowledge of General Education information will improve Description: Including pain rating scale, medication(s)/side effects and non-pharmacologic comfort measures Outcome: Progressing   Problem: Clinical Measurements: Goal: Ability to maintain clinical measurements within normal limits will improve Outcome: Progressing

## 2023-04-04 DIAGNOSIS — D649 Anemia, unspecified: Secondary | ICD-10-CM | POA: Diagnosis not present

## 2023-04-04 LAB — BASIC METABOLIC PANEL
Anion gap: 12 (ref 5–15)
BUN: 19 mg/dL (ref 6–20)
CO2: 26 mmol/L (ref 22–32)
Calcium: 9.1 mg/dL (ref 8.9–10.3)
Chloride: 100 mmol/L (ref 98–111)
Creatinine, Ser: 1.98 mg/dL — ABNORMAL HIGH (ref 0.61–1.24)
GFR, Estimated: 38 mL/min — ABNORMAL LOW (ref >60)
Glucose, Bld: 86 mg/dL (ref 70–99)
Potassium: 3.8 mmol/L (ref 3.5–5.1)
Sodium: 138 mmol/L (ref 135–145)

## 2023-04-04 LAB — BPAM RBC
Blood Product Expiration Date: 202502132359
ISSUE DATE / TIME: 202502120820
Unit Type and Rh: 9500

## 2023-04-04 LAB — PHOSPHORUS: Phosphorus: 4.3 mg/dL (ref 2.5–4.6)

## 2023-04-04 LAB — MAGNESIUM: Magnesium: 1.9 mg/dL (ref 1.7–2.4)

## 2023-04-04 LAB — TYPE AND SCREEN
ABO/RH(D): O POS
Antibody Screen: NEGATIVE
Unit division: 0

## 2023-04-04 LAB — CBC
HCT: 31.2 % — ABNORMAL LOW (ref 39.0–52.0)
Hemoglobin: 8.6 g/dL — ABNORMAL LOW (ref 13.0–17.0)
MCH: 18.7 pg — ABNORMAL LOW (ref 26.0–34.0)
MCHC: 27.6 g/dL — ABNORMAL LOW (ref 30.0–36.0)
MCV: 67.7 fL — ABNORMAL LOW (ref 80.0–100.0)
Platelets: 347 10*3/uL (ref 150–400)
RBC: 4.61 MIL/uL (ref 4.22–5.81)
RDW: 21.7 % — ABNORMAL HIGH (ref 11.5–15.5)
WBC: 7 10*3/uL (ref 4.0–10.5)
nRBC: 0.4 % — ABNORMAL HIGH (ref 0.0–0.2)

## 2023-04-04 MED ORDER — FOLIC ACID 1 MG PO TABS: 1.0000 mg | ORAL_TABLET | Freq: Every day | ORAL | 0 refills | Status: AC

## 2023-04-04 MED ORDER — FERROUS SULFATE 325 (65 FE) MG PO TABS: 325.0000 mg | ORAL_TABLET | Freq: Two times a day (BID) | ORAL | Status: DC

## 2023-04-04 MED ORDER — FOLIC ACID 1 MG PO TABS
1.0000 mg | ORAL_TABLET | Freq: Every day | ORAL | Status: DC
  Administered 2023-04-04: 1 mg via ORAL
  Filled 2023-04-04: qty 1

## 2023-04-04 MED ORDER — PANTOPRAZOLE SODIUM 40 MG PO TBEC: 40.0000 mg | DELAYED_RELEASE_TABLET | Freq: Every day | ORAL | 0 refills | Status: AC

## 2023-04-04 MED ORDER — FERROUS SULFATE 325 (65 FE) MG PO TABS: 325.0000 mg | ORAL_TABLET | Freq: Two times a day (BID) | ORAL | 0 refills | Status: AC

## 2023-04-04 MED ORDER — DOCUSATE SODIUM 100 MG PO CAPS: 100.0000 mg | ORAL_CAPSULE | Freq: Every day | ORAL | 0 refills | Status: AC

## 2023-04-04 NOTE — Progress Notes (Signed)
Patient is alert, awake , pleasant. Vital signs stable.

## 2023-04-04 NOTE — Discharge Instructions (Signed)
Follow with Primary MD Marcine Matar, MD in 7 days at a referral for EGD and colonoscopy by a gastroenterologist within a week.  Get CBC, CMP, anemia panel-  checked next visit with your primary MD    Activity: As tolerated with Full fall precautions use walker/cane & assistance as needed  Disposition Home     Diet: Heart Healthy    Special Instructions: If you have smoked or chewed Tobacco  in the last 2 yrs please stop smoking, stop any regular Alcohol  and or any Recreational drug use.  On your next visit with your primary care physician please Get Medicines reviewed and adjusted.  Please request your Prim.MD to go over all Hospital Tests and Procedure/Radiological results at the follow up, please get all Hospital records sent to your Prim MD by signing hospital release before you go home.  If you experience worsening of your admission symptoms, develop shortness of breath, life threatening emergency, suicidal or homicidal thoughts you must seek medical attention immediately by calling 911 or calling your MD immediately  if symptoms less severe.  You Must read complete instructions/literature along with all the possible adverse reactions/side effects for all the Medicines you take and that have been prescribed to you. Take any new Medicines after you have completely understood and accpet all the possible adverse reactions/side effects.   Do not drive when taking Pain medications.  Do not take more than prescribed Pain, Sleep and Anxiety Medications

## 2023-04-04 NOTE — Discharge Summary (Signed)
Norman Jones. ZOX:[id number] DOB: January 20, 1966 DOA: 04/02/2023  PCP: Marcine Matar, MD  Admit date: 04/02/2023  Discharge date: 04/04/2023  Admitted From: Home   Disposition:  Home   Recommendations for Outpatient Follow-up:   Follow up with PCP in 1-2 weeks  PCP Please obtain BMP/CBC, 2 view CXR in 1week,  (see Discharge instructions)   PCP Please follow up on the following pending results: Monitor anemia panel closely, needs GI follow-up for EGD colonoscopy, anemia workup in 1 to 2 weeks.   Home Health: None   Equipment/Devices: None  Consultations: None  Discharge Condition: Stable    CODE STATUS: Full    Diet Recommendation: Heart Healthy     Chief Complaint  Patient presents with   Shortness of Breath     Brief history of present illness from the day of admission and additional interim summary    58 yrs old male with PMH significant for chronic combined diastolic and systolic CHF, hypertension, CKD 3b, history of alcohol and tobacco abuse, history of H. pylori gastritis, who presented to the ED with complaints of progressively worsening shortness of breath, worse with exertion. He started noticing his symptoms 2 weeks ago when he would get short of breath at work.  Denies any chest pain.  Denies any NSAID use.  Denies any abdominal pain or overt bleeding.  He presented to the ER for further evaluation.  Lab work significant for hemoglobin 7.6.  BNP 2600.  Patient was given a dose of IV Lasix and admitted for acute on chronic symptomatic anemia and acute on chronic combined systolic and diastolic CHF.                                                                  Hospital Course   Acute on chronic symptomatic anemia: History of severe iron deficiency with anemia: Patient presented with Hb of 7.0,  dyspnea with minimal exertion. Patient received dose of IV iron.  Along with 1 unit of packed RBC. Posttransfusion H&H stable placed on oral iron and folic acid along with PPI.  Kindly see below, needs close outpatient follow-up with PCP and GI.   Chronic combined diastolic and systolic CHF: Last 2D echo done on 02/03/2023 revealed LVEF 20 to 25% with grade 3 diastolic dysfunction. Currently compensated continue home medications   CKD IIIB: Appears to be at his baseline renal function. Continue Entresto with close outpatient monitoring by PCP, patient somewhat unaware with his medical conditions, does not recall any MD names except that he goes to a heart clinic.  He has been given contact information for PCP and GI and requested to call and make appointments today.   History of alcohol and tobacco abuse: Recommend complete cessation No evidence of substance withdrawal at this time.   History  of angiodysplasia of stomach and duodenum: Single nonbleeding angioectasia in the stomach seen on upper endoscopy done on 02/03/2023 No follow-up postdischarge according to the patient, no signs of ongoing brisk bleeding, posttransfusion H&H stable, placed on PPI and have him follow-up with GI outpatient.  He was counseled to call and obtain an appointment ASAP.   History of H. pylori gastritis: Unclear if he completed his H. pylori treatment Have requested him to follow-up with GI.   Discharge diagnosis     Principal Problem:   Symptomatic anemia    Discharge instructions    Discharge Instructions     Diet - low sodium heart healthy   Complete by: As directed    Discharge instructions   Complete by: As directed    Follow with Primary MD Marcine Matar, MD in 7 days at a referral for EGD and colonoscopy by a gastroenterologist within a week.  Get CBC, CMP, anemia panel-  checked next visit with your primary MD    Activity: As tolerated with Full fall precautions use walker/cane &  assistance as needed  Disposition Home     Diet: Heart Healthy    Special Instructions: If you have smoked or chewed Tobacco  in the last 2 yrs please stop smoking, stop any regular Alcohol  and or any Recreational drug use.  On your next visit with your primary care physician please Get Medicines reviewed and adjusted.  Please request your Prim.MD to go over all Hospital Tests and Procedure/Radiological results at the follow up, please get all Hospital records sent to your Prim MD by signing hospital release before you go home.  If you experience worsening of your admission symptoms, develop shortness of breath, life threatening emergency, suicidal or homicidal thoughts you must seek medical attention immediately by calling 911 or calling your MD immediately  if symptoms less severe.  You Must read complete instructions/literature along with all the possible adverse reactions/side effects for all the Medicines you take and that have been prescribed to you. Take any new Medicines after you have completely understood and accpet all the possible adverse reactions/side effects.   Do not drive when taking Pain medications.  Do not take more than prescribed Pain, Sleep and Anxiety Medications   Increase activity slowly   Complete by: As directed        Discharge Medications   Allergies as of 04/04/2023       Reactions   Penicillins Other (See Comments)   Pt states he was told he was allergic by his mother, unknown reaction.        Medication List     TAKE these medications    docusate sodium 100 MG capsule Commonly known as: Colace Take 1 capsule (100 mg total) by mouth daily.   Entresto 24-26 MG Generic drug: sacubitril-valsartan Take 1 tablet by mouth 2 (two) times daily.   ferrous sulfate 325 (65 FE) MG tablet Take 1 tablet (325 mg total) by mouth 2 (two) times daily with a meal.   folic acid 1 MG tablet Commonly known as: FOLVITE Take 1 tablet (1 mg total) by mouth  daily.   metoprolol succinate 25 MG 24 hr tablet Commonly known as: TOPROL-XL Take 1 tablet (25 mg total) by mouth daily.   pantoprazole 40 MG tablet Commonly known as: Protonix Take 1 tablet (40 mg total) by mouth daily.         Follow-up Information     Marcine Matar, MD. Schedule an appointment  as soon as possible for a visit in 1 week(s).   Specialty: Internal Medicine Contact information: [address] [place] [address] [phone]         Kathi Der, MD. Schedule an appointment as soon as possible for a visit in 1 week(s).   Specialty: Gastroenterology Why: Iron deficiency anemia Contact information: [address] [place] [address] [phone]         [facility]. Schedule an appointment as soon as possible for a visit in 1 week(s).   Contact information: [address] [place] [address] [phone]                Major procedures and Radiology Reports - PLEASE review detailed and final reports thoroughly  -      DG Chest 2 View Result Date: 04/02/2023 CLINICAL DATA:  Shortness of breath EXAM: CHEST - 2 VIEW COMPARISON:  X-ray and CTA 02/01/2023.  Older exams as well FINDINGS: No consolidation, pneumothorax or effusion. No edema. Borderline stable cardiopericardial silhouette. IMPRESSION: Borderline size heart.  No acute cardiopulmonary disease. Electronically Signed   By: Karen Kays M.D.   On: 04/02/2023 17:47    Micro Results    Recent Results (from the past 240 hours)  Resp panel by RT-PCR (RSV, Flu A&B, Covid) Anterior Nasal Swab     Status: None   Collection Time: 04/02/23  4:06 PM   Specimen: Anterior Nasal Swab  Result Value Ref Range Status   SARS Coronavirus 2 by RT PCR NEGATIVE NEGATIVE Final   Influenza A by PCR NEGATIVE NEGATIVE Final   Influenza B by PCR NEGATIVE NEGATIVE Final    Comment: (NOTE) The Xpert Xpress  SARS-CoV-2/FLU/RSV plus assay is intended as an aid in the diagnosis of influenza from Nasopharyngeal swab specimens and should not be used as a sole basis for treatment. Nasal washings and aspirates are unacceptable for Xpert Xpress SARS-CoV-2/FLU/RSV testing.  Fact Sheet for Patients: BloggerCourse.com  Fact Sheet for Healthcare Providers: SeriousBroker.it  This test is not yet approved or cleared by the [place] FDA and has been authorized for detection and/or diagnosis of SARS-CoV-2 by FDA under an Emergency Use Authorization (EUA). This EUA will remain in effect (meaning this test can be used) for the duration of the COVID-19 declaration under Section 564(b)(1) of the Act, 21 U.S.C. section 360bbb-3(b)(1), unless the authorization is terminated or revoked.     Resp Syncytial Virus by PCR NEGATIVE NEGATIVE Final    Comment: (NOTE) Fact Sheet for Patients: BloggerCourse.com  Fact Sheet for Healthcare Providers: SeriousBroker.it  This test is not yet approved or cleared by the [place] FDA and has been authorized for detection and/or diagnosis of SARS-CoV-2 by FDA under an Emergency Use Authorization (EUA). This EUA will remain in effect (meaning this test can be used) for the duration of the COVID-19 declaration under Section 564(b)(1) of the Act, 21 U.S.C. section 360bbb-3(b)(1), unless the authorization is terminated or revoked.  Performed at [facility], [address], [place], [place] [address]     Today   Subjective    Norman Jones today has no headache,no chest abdominal pain,no new weakness tingling or numbness, feels much better wants to go home today.    Objective   Blood pressure 119/82, pulse 76, temperature 98.3 F (36.8 C), temperature source Oral, resp. rate 20, height 5\' 9"  (1.753 m), weight 96.6 kg, SpO2 98%.  No intake or output  data in the 24 hours ending 04/04/23 1107  Exam  Awake Alert, No new F.N deficits,    [place].AT,PERRAL Supple Neck,   Symmetrical Chest wall movement, Good air movement bilaterally, CTAB RRR,No Gallops,   +ve B.Sounds, Abd Soft, Non tender,  No Cyanosis, Clubbing or edema    Data Review   Recent Labs  Lab 04/02/23 1606 04/03/23 0623 04/03/23 1426 04/04/23 0433  WBC 6.9 7.9  --  7.0  HGB 7.0* 6.7* 8.7* 8.6*  HCT 26.7* 25.0* 31.8* 31.2*  PLT 414* 339  --  347  MCV 69.2* 67.8*  --  67.7*  MCH 18.1* 18.2*  --  18.7*  MCHC 26.2* 26.8*  --  27.6*  RDW 22.1* 22.0*  --  21.7*    Recent Labs  Lab 04/02/23 1606 04/03/23 0623 04/04/23 0433  NA 138 137 138  K 4.4 3.9 3.8  CL 105 103 100  CO2 22 22 26   ANIONGAP 11 12 12   GLUCOSE 110* 86 86  BUN 25* 22* 19  CREATININE 1.94* 1.95* 1.98*  AST  --  29  --   ALT  --  13  --   ALKPHOS  --  73  --   BILITOT  --  1.0  --   ALBUMIN  --  3.0*  --   BNP 2,698.5*  --   --   MG  --  1.9 1.9  PHOS  --  4.1 4.3  CALCIUM 8.8* 8.5* 9.1    Total Time in preparing paper work, data evaluation and todays exam - 35 minutes  Signature  -    Susa Raring M.D on 04/04/2023 at 11:07 AM   -  To page go to [email or web address]

## 2023-04-04 NOTE — Plan of Care (Signed)
?  Problem: Clinical Measurements: ?Goal: Will remain free from infection ?Outcome: Progressing ?  ?Problem: Clinical Measurements: ?Goal: Diagnostic test results will improve ?Outcome: Progressing ?  ?

## 2023-04-04 NOTE — TOC Transition Note (Signed)
Transition of Care [facility]) - Discharge Note   Patient Details  Name: Norman Jones. MRN: [id number] Date of Birth: 1965-09-15  Transition of Care [facility]) CM/SW Contact:  Gordy Clement, RN Phone Number: 04/04/2023, 11:09 AM   Clinical Narrative:     Patient to  DC to home today  No TOC needs identified PCP is established and patient is insured through [facility] PPO  Patient will follow up as directed on AVS             Patient Goals and CMS Choice            Discharge Placement                       Discharge Plan and Services Additional resources added to the After Visit Summary for                                       Social Drivers of Health (SDOH) Interventions SDOH Screenings   Food Insecurity: No Food Insecurity (04/02/2023)  Housing: Low Risk  (04/02/2023)  Transportation Needs: No Transportation Needs (04/02/2023)  Utilities: Not At Risk (04/02/2023)  Alcohol Screen: Low Risk  (02/04/2023)  Financial Resource Strain: Low Risk  (02/04/2023)  Tobacco Use: High Risk (04/02/2023)     Readmission Risk Interventions     No data to display

## 2023-04-04 NOTE — TOC CM/SW Note (Signed)
Transition of Care [facility]) - Inpatient Brief Assessment   Patient Details  Name: Norman Jones. MRN: [id number] Date of Birth: 1965-08-30  Transition of Care [facility]) CM/SW Contact:    Mearl Latin, LCSW Phone Number: 04/04/2023, 8:52 AM   Clinical Narrative: Patient admitted from home undergoing workup for anemia. No current TOC needs identified at this time but please place consult if needed as patient progresses.    Transition of Care Asessment: Insurance and Status: Insurance coverage has been reviewed Patient has primary care physician: Yes Home environment has been reviewed: From home Prior level of function:: Independent Prior/Current Home Services: No current home services Social Drivers of Health Review: SDOH reviewed no interventions necessary Readmission risk has been reviewed: Yes Transition of care needs: no transition of care needs at this time

## 2023-04-04 NOTE — Plan of Care (Signed)

## 2023-04-04 NOTE — Plan of Care (Addendum)
                                      [facility]                            [address] [place], [place] [address]      Norman Jones was admitted to the Hospital on 04/02/2023 and Discharged  04/04/2023 and should be excused from work/school   for 4 days starting from date -  04/02/2023 , may return to work/school without any restrictions.  Call Susa Raring MD, [facility]  [phone] with questions.  Susa Raring M.D on 04/04/2023,at 11:35 AM  [facility]   Office  [phone]

## 2023-04-08 ENCOUNTER — Telehealth: Payer: Self-pay

## 2023-04-08 NOTE — Transitions of Care (Post Inpatient/ED Visit) (Signed)
   04/08/2023  Name: Norman Jones. MRN: [id number] DOB: 02/05/1966  Today's TOC FU Call Status: Today's TOC FU Call Status:: Unsuccessful Call (1st Attempt) Unsuccessful Call (1st Attempt) Date: 04/08/23  Attempted to reach the patient regarding the most recent Inpatient/ED visit.  Follow Up Plan: Additional outreach attempts will be made to reach the patient to complete the Transitions of Care (Post Inpatient/ED visit) call.   Dr Laural Benes is listed a PCP but he has never seen her.   Signature Robyne Peers, RN

## 2023-04-09 ENCOUNTER — Telehealth: Payer: Self-pay

## 2023-04-09 ENCOUNTER — Ambulatory Visit: Payer: BC Managed Care – PPO | Admitting: Gastroenterology

## 2023-04-09 NOTE — Transitions of Care (Post Inpatient/ED Visit) (Signed)
   04/09/2023  Name: Norman Jones. MRN: [id number] DOB: [birth year]  Today's TOC FU Call Status: Today's TOC FU Call Status:: Unsuccessful Call (2nd Attempt) Unsuccessful Call (1st Attempt) Date: 04/08/23 Unsuccessful Call (2nd Attempt) Date: 04/09/23 [facility] FU Call Complete Date: 04/04/23  Attempted to reach the patient regarding the most recent Inpatient/ED visit.  Follow Up Plan: Additional outreach attempts will be made to reach the patient to complete the Transitions of Care (Post Inpatient/ED visit) call.   Signature  Robyne Peers, RN

## 2023-04-10 ENCOUNTER — Telehealth: Payer: Self-pay

## 2023-04-10 NOTE — Transitions of Care (Post Inpatient/ED Visit) (Signed)
   04/10/2023  Name: Norman Jones. MRN: [id number] DOB: 09/15/1965  Today's TOC FU Call Status: Unsuccessful Call (1st Attempt) Date: 04/08/23 Unsuccessful Call (2nd Attempt) Date: 04/09/23 [facility] FU Call Complete Date: 04/10/23  Attempted to reach the patient regarding the most recent Inpatient/ED visit.  Follow Up Plan: No further outreach attempts will be made at this time. We have been unable to contact the patient.  Dr Laural Benes is listed as patient's PCP but he has never seen her.  Letter sent to patient requesting he call the clinic to schedule an appointment as we have not been able to reach him.    Signature  Robyne Peers, RN

## 2023-04-18 ENCOUNTER — Other Ambulatory Visit (HOSPITAL_COMMUNITY): Payer: Self-pay | Admitting: Cardiology

## 2023-04-18 ENCOUNTER — Telehealth: Payer: Self-pay | Admitting: *Deleted

## 2023-04-18 NOTE — Telephone Encounter (Signed)
 Called patient and made aware that he was scheduled for appointment with me on Friday 2/28.  He has already established with advanced heart failure and he does not need general cardiologist.  Patient verbalized an understanding and appt with Dr. Bjorn Pippin cancelled.

## 2023-04-19 ENCOUNTER — Ambulatory Visit: Payer: BC Managed Care – PPO | Admitting: Cardiology

## 2023-04-29 ENCOUNTER — Encounter (HOSPITAL_COMMUNITY): Payer: Self-pay | Admitting: Cardiology

## 2023-04-29 ENCOUNTER — Ambulatory Visit (HOSPITAL_COMMUNITY)
Admission: RE | Admit: 2023-04-29 | Discharge: 2023-04-29 | Disposition: A | Discharge status: Home / Self Care | Payer: BC Managed Care – PPO | Source: Ambulatory Visit | Attending: Cardiology | Admitting: Cardiology

## 2023-04-29 VITALS — BP 120/80 | HR 82 | Wt 144.6 lb

## 2023-04-29 DIAGNOSIS — F17200 Nicotine dependence, unspecified, uncomplicated: Secondary | ICD-10-CM | POA: Diagnosis not present

## 2023-04-29 DIAGNOSIS — I340 Nonrheumatic mitral (valve) insufficiency: Secondary | ICD-10-CM | POA: Diagnosis not present

## 2023-04-29 DIAGNOSIS — I5022 Chronic systolic (congestive) heart failure: Secondary | ICD-10-CM | POA: Diagnosis present

## 2023-04-29 DIAGNOSIS — F1010 Alcohol abuse, uncomplicated: Secondary | ICD-10-CM | POA: Diagnosis not present

## 2023-04-29 DIAGNOSIS — I130 Hypertensive heart and chronic kidney disease with heart failure and stage 1 through stage 4 chronic kidney disease, or unspecified chronic kidney disease: Secondary | ICD-10-CM | POA: Insufficient documentation

## 2023-04-29 DIAGNOSIS — I428 Other cardiomyopathies: Secondary | ICD-10-CM | POA: Diagnosis present

## 2023-04-29 DIAGNOSIS — N1832 Chronic kidney disease, stage 3b: Secondary | ICD-10-CM | POA: Diagnosis not present

## 2023-04-29 DIAGNOSIS — D509 Iron deficiency anemia, unspecified: Secondary | ICD-10-CM | POA: Diagnosis not present

## 2023-04-29 DIAGNOSIS — I5042 Chronic combined systolic (congestive) and diastolic (congestive) heart failure: Secondary | ICD-10-CM | POA: Diagnosis not present

## 2023-04-29 MED ORDER — DAPAGLIFLOZIN PROPANEDIOL 10 MG PO TABS: 10.0000 mg | ORAL_TABLET | Freq: Every day | ORAL | 11 refills | Status: AC

## 2023-04-29 NOTE — Patient Instructions (Signed)
 RESTART Farxiga 10 mg daily.  You have been referred to the Gastroenterologist. You will be called to have this appointment arranged.  Your physician recommends that you schedule a follow-up appointment in: 3 months (June) ** PLEASE CALL THE OFFICE IN APRIL TO ARRANGE YOUR FOLLOW UP APPOINTMENT.**  If you have any questions or concerns before your next appointment please send [place] a message through [place] or call our office at [phone].    TO LEAVE A MESSAGE FOR THE NURSE SELECT OPTION 2, PLEASE LEAVE A MESSAGE INCLUDING: YOUR NAME DATE OF BIRTH CALL BACK NUMBER REASON FOR CALL**this is important as we prioritize the call backs  YOU WILL RECEIVE A CALL BACK THE SAME DAY AS LONG AS YOU CALL BEFORE 4:00 PM  At the [facility], you and your health needs are our priority. As part of our continuing mission to provide you with exceptional heart care, we have created designated Provider Care Teams. These Care Teams include your primary Cardiologist (physician) and Advanced Practice Providers (APPs- Physician Assistants and Nurse Practitioners) who all work together to provide you with the care you need, when you need it.   You may see any of the following providers on your designated Care Team at your next follow up: Dr Arvilla Meres Dr Marca Ancona Dr. Dorthula Nettles Dr. Clearnce Hasten Amy Filbert Schilder, NP Robbie Lis, [place] [facility] [place], [place] Brynda Peon, NP [place] Lee, NP Clarisa Kindred, NP Karle Plumber, PharmD Enos Fling, PharmD   Please be sure to bring in all your medications bottles to every appointment.    Thank you for choosing [facility]

## 2023-04-29 NOTE — Progress Notes (Signed)
 ADVANCED HEART FAILURE FOLLOW UP CLINIC NOTE  Referring Physician: Marcine Matar, MD  Primary Care: Marcine Matar, MD Primary Cardiologist:  HPI: Norman Jones. is a 58 y.o. male who presents for follow up of  NICM, chronic HFrEF (EF as low as 20-25% in 2018 but improved to 45-50% on last Echo in 03/2017), hypertension, CKD stage IIIb, ETOH abuse, tobacco abuse.      Cardiac history dates back to 2014. Echo 2014 showed LVEF 27% w G1DD and mild MR. Had initially done well, however lost is job and was unable to afford medications. Presented to ED in 2018 with worsening function, EF 20-25% w severe RV dysfunction, mod MR and TR, mod to severe PA peak 67. He was restarted on GDMT with EF recovery in 2019 to 45-50% and mild to mod MR.   He was then lost to follow up at that time, and presented to the ED with a chief complaint of chest pain. He was mildly hypertensive and tachycardic and was found to be newly anemic. He underwent EGD/colonoscopy that showed a small gastric AVM but no other clear source of bleeding. He underwent a RHC that showed normal filling pressures as well as preserved cardiac index.        SUBJECTIVE:  Patient reports that overall he is doing well.  He was recently admitted for shortness of breath that was due to anemia as opposed to heart failure.  He was given blood and discharged with close follow-up.  He has no complaints, and reports that his blood pressure has been well-controlled.  He has had no issues with his medications since discharge.  PMH, current medications, allergies, social history, and family history reviewed in epic.  PHYSICAL EXAM: Vitals:   04/29/23 0929  BP: 120/80  Pulse: 82  SpO2: 99%    GENERAL: Well nourished and in no apparent distress at rest.  HEENT: The mucous membranes are pink and moist.   PULM:  Normal work of breathing, clear to auscultation bilaterally. Respirations are unlabored.  CARDIAC:  JVP: Not elevated         Tachycardic, systolic murmur best heard at the apex.  ABDOMEN: Soft, non-tender, non-distended. NEUROLOGIC: Patient is oriented x3 with no focal or lateralizing neurologic deficits.  PSYCH: Patients affect is appropriate, there is no evidence of anxiety or depression.  SKIN: Warm and dry; no lesions or wounds. Warm and well perfused extremities.  DATA REVIEW  ECG: 02/25/23: Sinus tachycardia, LVH   ECHO: 02/03/23: LVEF 20-25%, severely dilated LV with severe MR, left atrial size severely dilated  CATH: RHC 01/2023: RA 3, PA 34/11 (23), PCWP 12, TD CO/CI 4.2/2.5.   CMR 01/2023: LVEF 30%, severely dilated, LGE in the basal septum, moderately enlarged RV with reduced function, severe MR  Heart failure review: - Classification: Heart failure with reduced EF - Etiology: Work up ongoing - NYHA Class: I - Volume status: Euvolemic - ACEi/ARB/ARNI: Currently up-titrating - Aldosterone antagonist: Maximally tolerated dose - Beta-blocker: Currently up-titrating - Digoxin: Not indicated - Hydralazine/Nitrates: Not indicated - SGLT2i: Maximally tolerated dose - GLP-1: Consider in future - Advanced therapies: Not needed at this time - ICD: Currently uptitrating GDMT  ASSESSMENT & PLAN:  Chronic systolic heart failure: Patient with history of nonischemic HF with recovered EF, now worsened in the setting of medication nonadherence. He has minimal symptoms currently, recent shortness of breath more due to anemia. Feels better on entresto.  Will start back Farxiga given CKD. CMR not consistent  with ischemia, unless he were to develop anginal symptoms can hold off on LHC. -Continue entresto 24/26mg  BID - No spironolactone with renal function, repeat labs at next visit and consider -Start farxiga 10mg  daily -Continue metoprolol succinate 25mg  daily - recent BMP reviewed - Continue to titrate GDMT and repeat echo to assess for ICD need - Follow up echo to be ordered after next visit  Severe  mitral regurgitation: Noted on previous echo and MRI, secondary due to LV dilation. Minimal symptoms and not optimized on medical therapy, but could consider clip in future. - Continue medical thearpy as above  Alcohol and tobacco abuse: Reduction since last visit.  - Counseling offered, patient ready to quit  CKD stage IIIa: Noted from prior admission - Start SGLT-2 as above - Labs at next visit  Blood loss anemia: Recent admissions for anemia, missed outpatient IV iron appointments, stressed the importance. -Follow-up with GI  Clearnce Hasten, MD Advanced Heart Failure Mechanical Circulatory Support 04/29/23

## 2023-05-07 ENCOUNTER — Emergency Department (HOSPITAL_COMMUNITY)
Admission: EM | Admit: 2023-05-07 | Discharge: 2023-05-07 | Discharge status: Against Medical Advice | Attending: Emergency Medicine | Admitting: Emergency Medicine

## 2023-05-07 ENCOUNTER — Encounter (HOSPITAL_COMMUNITY): Payer: Self-pay

## 2023-05-07 ENCOUNTER — Other Ambulatory Visit: Payer: Self-pay

## 2023-05-07 DIAGNOSIS — M25579 Pain in unspecified ankle and joints of unspecified foot: Secondary | ICD-10-CM | POA: Diagnosis present

## 2023-05-07 DIAGNOSIS — Z5321 Procedure and treatment not carried out due to patient leaving prior to being seen by health care provider: Secondary | ICD-10-CM | POA: Diagnosis not present

## 2023-05-07 LAB — CBC WITH DIFFERENTIAL/PLATELET
Abs Immature Granulocytes: 0.04 10*3/uL (ref 0.00–0.07)
Basophils Absolute: 0 10*3/uL (ref 0.0–0.1)
Basophils Relative: 0 %
Eosinophils Absolute: 0.1 10*3/uL (ref 0.0–0.5)
Eosinophils Relative: 1 %
HCT: 34.7 % — ABNORMAL LOW (ref 39.0–52.0)
Hemoglobin: 9.5 g/dL — ABNORMAL LOW (ref 13.0–17.0)
Immature Granulocytes: 0 %
Lymphocytes Relative: 9 %
Lymphs Abs: 0.8 10*3/uL (ref 0.7–4.0)
MCH: 19.5 pg — ABNORMAL LOW (ref 26.0–34.0)
MCHC: 27.4 g/dL — ABNORMAL LOW (ref 30.0–36.0)
MCV: 71.1 fL — ABNORMAL LOW (ref 80.0–100.0)
Monocytes Absolute: 1.1 10*3/uL — ABNORMAL HIGH (ref 0.1–1.0)
Monocytes Relative: 11 %
Neutro Abs: 7.7 10*3/uL (ref 1.7–7.7)
Neutrophils Relative %: 79 %
Platelets: 313 10*3/uL (ref 150–400)
RBC: 4.88 MIL/uL (ref 4.22–5.81)
RDW: 23.3 % — ABNORMAL HIGH (ref 11.5–15.5)
WBC: 9.7 10*3/uL (ref 4.0–10.5)
nRBC: 0 % (ref 0.0–0.2)

## 2023-05-07 LAB — BASIC METABOLIC PANEL
Anion gap: 9 (ref 5–15)
BUN: 19 mg/dL (ref 6–20)
CO2: 23 mmol/L (ref 22–32)
Calcium: 9.2 mg/dL (ref 8.9–10.3)
Chloride: 107 mmol/L (ref 98–111)
Creatinine, Ser: 1.48 mg/dL — ABNORMAL HIGH (ref 0.61–1.24)
GFR, Estimated: 54 mL/min — ABNORMAL LOW (ref >60)
Glucose, Bld: 104 mg/dL — ABNORMAL HIGH (ref 70–99)
Potassium: 4.2 mmol/L (ref 3.5–5.1)
Sodium: 139 mmol/L (ref 135–145)

## 2023-05-07 LAB — BRAIN NATRIURETIC PEPTIDE: B Natriuretic Peptide: 1529.6 pg/mL — ABNORMAL HIGH (ref 0.0–100.0)

## 2023-05-07 NOTE — ED Provider Notes (Signed)
 Patient eloped prior to being roomed   Norman Jones 05/07/23 2213    Charlynne Pander, MD 05/07/23 [phone]

## 2023-05-07 NOTE — ED Triage Notes (Signed)
 Pt c/o left ankle pain. Pt denies injury. Pt has 1+ left ankle and foot swelling. Pt states this has happened a few times in the past. Pt has 2+ left pedal pulse, cap refill less than 3 sec, warm to touch

## 2023-05-07 NOTE — ED Provider Triage Note (Signed)
 Emergency Medicine Provider Triage Evaluation Note  Norman Jones. , a 58 y.o. male  was evaluated in triage.  Pt complains of L foot swelling Hx CHF.  Review of Systems  Positive: Swelling, mild pain Negative: Fever, chills, erythema, injury, SHOB, CP  Physical Exam  BP (!) 119/92   Pulse 87   Temp 97.6 F (36.4 C) (Oral)   Resp 16   Ht 5\' 9"  (1.753 m)   Wt 65.6 kg   SpO2 100%   BMI 21.36 kg/m  Gen:   Awake, no distress   Resp:  Normal effort  MSK:   Moves extremities without difficulty  Other:  +2 pitting to L foot  Medical Decision Making  Medically screening exam initiated at 2:49 PM.  Appropriate orders placed.  Norman Jones. was informed that the remainder of the evaluation will be completed by another provider, this initial triage assessment does not replace that evaluation, and the importance of remaining in the ED until their evaluation is complete.  Labs ordered   Norman Jenny, PA-C 05/07/23 1451

## 2023-05-07 NOTE — ED Notes (Signed)
 Pt called x2 for a room with no answer.

## 2023-10-08 ENCOUNTER — Telehealth (HOSPITAL_COMMUNITY): Payer: Self-pay | Admitting: Cardiology

## 2023-12-12 ENCOUNTER — Telehealth (HOSPITAL_COMMUNITY): Payer: Self-pay | Admitting: Cardiology

## 2023-12-12 NOTE — Telephone Encounter (Signed)
 Called to confirm/remind patient of their appointment at the [facility] on 12/12/23.   Appointment:   [] Confirmed  [x] Left mess   [] No answer/No voice mail  [] VM Full/unable to leave message  [] Phone not in service  Patient reminded to bring all medications and/or complete list.  Confirmed patient has transportation. Gave directions, instructed to utilize valet parking.

## 2023-12-13 ENCOUNTER — Ambulatory Visit (HOSPITAL_COMMUNITY): Admitting: Cardiology

## 2023-12-13 NOTE — Progress Notes (Incomplete)
 ADVANCED HEART FAILURE FOLLOW UP CLINIC NOTE  Referring Physician: No ref. provider found  Primary Care: Pcp, No Primary Cardiologist:  HPI: Norman Jones. is a 58 y.o. male who presents for follow up of  NICM, chronic HFrEF (EF as low as 20-25% in 2018 but improved to 45-50% on last Echo in 03/2017), hypertension, CKD stage IIIb, ETOH abuse, tobacco abuse.      Cardiac history dates back to 2014. Echo 2014 showed LVEF 27% w G1DD and mild MR. Had initially done well, however lost is job and was unable to afford medications. Presented to ED in 2018 with worsening function, EF 20-25% w severe RV dysfunction, mod MR and TR, mod to severe PA peak 67. He was restarted on GDMT with EF recovery in 2019 to 45-50% and mild to mod MR.   He was then lost to follow up at that time, and presented to the ED with a chief complaint of chest pain. He was mildly hypertensive and tachycardic and was found to be newly anemic. He underwent EGD/colonoscopy that showed a small gastric AVM but no other clear source of bleeding. He underwent a RHC that showed normal filling pressures as well as preserved cardiac index.        SUBJECTIVE:  Patient reports that overall he is doing well.  He was recently admitted for shortness of breath that was due to anemia as opposed to heart failure.  He was given blood and discharged with close follow-up.  He has no complaints, and reports that his blood pressure has been well-controlled.  He has had no issues with his medications since discharge.  PMH, current medications, allergies, social history, and family history reviewed in epic.  PHYSICAL EXAM: There were no vitals filed for this visit.   GENERAL: Well nourished and in no apparent distress at rest.  HEENT: The mucous membranes are pink and moist.   PULM:  Normal work of breathing, clear to auscultation bilaterally. Respirations are unlabored.  CARDIAC:  JVP: Not elevated        Tachycardic, systolic murmur  best heard at the apex.  ABDOMEN: Soft, non-tender, non-distended. NEUROLOGIC: Patient is oriented x3 with no focal or lateralizing neurologic deficits.  PSYCH: Patients affect is appropriate, there is no evidence of anxiety or depression.  SKIN: Warm and dry; no lesions or wounds. Warm and well perfused extremities.  DATA REVIEW  ECG: 02/25/23: Sinus tachycardia, LVH   ECHO: 02/03/23: LVEF 20-25%, severely dilated LV with severe MR, left atrial size severely dilated  CATH: RHC 01/2023: RA 3, PA 34/11 (23), PCWP 12, TD CO/CI 4.2/2.5.   CMR 01/2023: LVEF 30%, severely dilated, LGE in the basal septum, moderately enlarged RV with reduced function, severe MR   ASSESSMENT & PLAN:  Chronic systolic heart failure: Patient with history of nonischemic HF with recovered EF, now worsened in the setting of medication nonadherence. He has minimal symptoms currently, recent shortness of breath more due to anemia. Feels better on entresto .  Will start back Farxiga  given CKD. CMR not consistent with ischemia, unless he were to develop anginal symptoms can hold off on LHC. -Continue entresto  24/26mg  BID - No spironolactone with renal function, repeat labs at next visit and consider -Start farxiga  10mg  daily -Continue metoprolol  succinate 25mg  daily - recent BMP reviewed - Continue to titrate GDMT and repeat echo to assess for ICD need - Follow up echo to be ordered after next visit  Severe mitral regurgitation: Noted on previous echo and MRI,  secondary due to LV dilation. Minimal symptoms and not optimized on medical therapy, but could consider clip in future. - Continue medical thearpy as above  Alcohol and tobacco abuse: Reduction since last visit.  - Counseling offered, patient ready to quit  CKD stage IIIa: Noted from prior admission - Start SGLT-2 as above - Labs at next visit  Blood loss anemia: Recent admissions for anemia, missed outpatient IV iron  appointments, stressed the  importance. -Follow-up with GI  Morene Brownie, MD Advanced Heart Failure Mechanical Circulatory Support 12/13/23
